# Patient Record
Sex: Female | Born: 1965 | Race: Black or African American | Hispanic: No | Marital: Single | Smoking: Current some day smoker
Health system: Southern US, Community
[De-identification: ages and names within clinical notes are randomized; demographics above are authoritative.]

## PROBLEM LIST (undated history)

## (undated) DIAGNOSIS — J40 Bronchitis, not specified as acute or chronic: Secondary | ICD-10-CM

## (undated) DIAGNOSIS — J449 Chronic obstructive pulmonary disease, unspecified: Secondary | ICD-10-CM

## (undated) DIAGNOSIS — R0600 Dyspnea, unspecified: Secondary | ICD-10-CM

## (undated) DIAGNOSIS — G459 Transient cerebral ischemic attack, unspecified: Secondary | ICD-10-CM

## (undated) DIAGNOSIS — C3490 Malignant neoplasm of unspecified part of unspecified bronchus or lung: Secondary | ICD-10-CM

## (undated) DIAGNOSIS — E119 Type 2 diabetes mellitus without complications: Secondary | ICD-10-CM

## (undated) DIAGNOSIS — I10 Essential (primary) hypertension: Secondary | ICD-10-CM

## (undated) DIAGNOSIS — K219 Gastro-esophageal reflux disease without esophagitis: Secondary | ICD-10-CM

## (undated) DIAGNOSIS — J189 Pneumonia, unspecified organism: Secondary | ICD-10-CM

## (undated) DIAGNOSIS — M1990 Unspecified osteoarthritis, unspecified site: Secondary | ICD-10-CM

## (undated) DIAGNOSIS — T300 Burn of unspecified body region, unspecified degree: Secondary | ICD-10-CM

## (undated) HISTORY — PX: TUBAL LIGATION: SHX77

## (undated) HISTORY — PX: COLONOSCOPY: SHX174

## (undated) HISTORY — PX: OTHER SURGICAL HISTORY: SHX169

---

## 1999-07-19 ENCOUNTER — Emergency Department (HOSPITAL_COMMUNITY): Admission: EM | Admit: 1999-07-19 | Discharge: 1999-07-19 | Payer: Self-pay | Admitting: Emergency Medicine

## 2000-02-17 ENCOUNTER — Emergency Department (HOSPITAL_COMMUNITY): Admission: EM | Admit: 2000-02-17 | Discharge: 2000-02-17 | Payer: Self-pay | Admitting: Emergency Medicine

## 2007-08-09 ENCOUNTER — Emergency Department (HOSPITAL_COMMUNITY): Admission: EM | Admit: 2007-08-09 | Discharge: 2007-08-09 | Payer: Self-pay | Admitting: Emergency Medicine

## 2007-10-21 ENCOUNTER — Emergency Department (HOSPITAL_COMMUNITY): Admission: EM | Admit: 2007-10-21 | Discharge: 2007-10-21 | Payer: Self-pay | Admitting: Emergency Medicine

## 2010-03-14 ENCOUNTER — Emergency Department (HOSPITAL_COMMUNITY): Admission: EM | Admit: 2010-03-14 | Discharge: 2010-03-15 | Payer: Self-pay | Admitting: Emergency Medicine

## 2010-12-28 LAB — ETHANOL: Alcohol, Ethyl (B): 210 mg/dL — ABNORMAL HIGH (ref 0–10)

## 2010-12-28 LAB — COMPREHENSIVE METABOLIC PANEL
Calcium: 8.6 mg/dL (ref 8.4–10.5)
GFR calc non Af Amer: >60 mL/min (ref >60)
Sodium: 137 mEq/L (ref 135–145)
Total Bilirubin: 0.7 mg/dL (ref 0.3–1.2)
Total Protein: 8.7 g/dL — ABNORMAL HIGH (ref 6.0–8.3)

## 2010-12-28 LAB — CBC
Hemoglobin: 13.8 g/dL (ref 12.0–15.0)
MCV: 97.5 fL (ref 78.0–100.0)
RDW: 14.1 % (ref 11.5–15.5)

## 2010-12-28 LAB — DIFFERENTIAL
Basophils Absolute: 0 10*3/uL (ref 0.0–0.1)
Lymphs Abs: 4.5 10*3/uL — ABNORMAL HIGH (ref 0.7–4.0)
Monocytes Absolute: 0.8 10*3/uL (ref 0.1–1.0)
Monocytes Relative: 7 % (ref 3–12)
Neutro Abs: 6.2 10*3/uL (ref 1.7–7.7)

## 2011-03-24 ENCOUNTER — Inpatient Hospital Stay (INDEPENDENT_AMBULATORY_CARE_PROVIDER_SITE_OTHER)
Admission: RE | Admit: 2011-03-24 | Discharge: 2011-03-24 | Disposition: A | Discharge status: Home / Self Care | Payer: Self-pay | Source: Ambulatory Visit | Attending: Family Medicine | Admitting: Family Medicine

## 2011-03-24 ENCOUNTER — Ambulatory Visit (INDEPENDENT_AMBULATORY_CARE_PROVIDER_SITE_OTHER): Payer: Self-pay

## 2011-03-24 DIAGNOSIS — J40 Bronchitis, not specified as acute or chronic: Secondary | ICD-10-CM

## 2011-03-24 DIAGNOSIS — K047 Periapical abscess without sinus: Secondary | ICD-10-CM

## 2011-07-21 LAB — CBC
HCT: 36.7
MCV: 98.3
RDW: 13.6

## 2011-07-21 LAB — URINE CULTURE: Culture: NO GROWTH

## 2011-07-21 LAB — DIFFERENTIAL
Eosinophils Absolute: 0.2
Eosinophils Relative: 3
Lymphocytes Relative: 37
Lymphs Abs: 2.3
Neutro Abs: 3
Neutrophils Relative %: 49

## 2011-07-21 LAB — COMPREHENSIVE METABOLIC PANEL
ALT: 20
Albumin: 3.4 — ABNORMAL LOW
Calcium: 8.8
Creatinine, Ser: 0.73
GFR calc non Af Amer: >60
Glucose, Bld: 80

## 2011-07-21 LAB — URINALYSIS, ROUTINE W REFLEX MICROSCOPIC
Glucose, UA: NEGATIVE
Hgb urine dipstick: NEGATIVE
Nitrite: NEGATIVE
Urobilinogen, UA: 0.2
pH: 6.5

## 2011-07-21 LAB — GC/CHLAMYDIA PROBE AMP, GENITAL: GC Probe Amp, Genital: NEGATIVE

## 2011-07-21 LAB — POCT PREGNANCY, URINE
Operator id: 198171
Preg Test, Ur: NEGATIVE

## 2011-09-03 ENCOUNTER — Encounter: Payer: Self-pay | Admitting: *Deleted

## 2011-09-03 ENCOUNTER — Emergency Department (HOSPITAL_COMMUNITY)
Admission: EM | Admit: 2011-09-03 | Discharge: 2011-09-03 | Disposition: A | Discharge status: Home / Self Care | Payer: Self-pay | Attending: Emergency Medicine | Admitting: Emergency Medicine

## 2011-09-03 DIAGNOSIS — R220 Localized swelling, mass and lump, head: Secondary | ICD-10-CM | POA: Insufficient documentation

## 2011-09-03 DIAGNOSIS — F17200 Nicotine dependence, unspecified, uncomplicated: Secondary | ICD-10-CM | POA: Insufficient documentation

## 2011-09-03 DIAGNOSIS — R221 Localized swelling, mass and lump, neck: Secondary | ICD-10-CM | POA: Insufficient documentation

## 2011-09-03 DIAGNOSIS — K089 Disorder of teeth and supporting structures, unspecified: Secondary | ICD-10-CM | POA: Insufficient documentation

## 2011-09-03 DIAGNOSIS — K047 Periapical abscess without sinus: Secondary | ICD-10-CM | POA: Insufficient documentation

## 2011-09-03 DIAGNOSIS — R110 Nausea: Secondary | ICD-10-CM | POA: Insufficient documentation

## 2011-09-03 DIAGNOSIS — R509 Fever, unspecified: Secondary | ICD-10-CM | POA: Insufficient documentation

## 2011-09-03 MED ORDER — CLINDAMYCIN HCL 150 MG PO CAPS
300.0000 mg | ORAL_CAPSULE | Freq: Once | ORAL | Status: AC
  Administered 2011-09-03: 300 mg via ORAL
  Filled 2011-09-03: qty 2

## 2011-09-03 MED ORDER — HYDROCODONE-ACETAMINOPHEN 5-325 MG PO TABS: 1.0000 | ORAL_TABLET | ORAL | Status: AC | PRN

## 2011-09-03 MED ORDER — CLINDAMYCIN HCL 150 MG PO CAPS: 150.0000 mg | ORAL_CAPSULE | Freq: Four times a day (QID) | ORAL | Status: AC
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
## 2011-09-03 NOTE — ED Notes (Signed)
Pt d/c home. Pt ambulatory and denies any pain. NAD noted at this time.

## 2011-09-03 NOTE — ED Notes (Signed)
MD at bedside. 

## 2011-09-03 NOTE — ED Provider Notes (Signed)
History     CSN: [id number] Arrival date & time: 09/03/2011 12:29 PM   None     Chief Complaint  Patient presents with  . Dental Pain    (Consider location/radiation/quality/duration/timing/severity/associated sxs/prior treatment) Patient is a 45 y.o. female presenting with tooth pain. The history is provided by the patient.  Dental PainThe primary symptoms include mouth pain and fever. The symptoms began 12 to 24 hours ago. The symptoms are worsening. The symptoms are new. The symptoms occur intermittently.  Additional symptoms include: gum swelling, gum tenderness, purulent gums and facial swelling.    History reviewed. No pertinent past medical history.  Past Surgical History  Procedure Date  . Tubal ligation     No family history on file.  History  Substance Use Topics  . Smoking status: Current Everyday Smoker  . Smokeless tobacco: Not on file  . Alcohol Use: Yes    OB History    Grav Para Term Preterm Abortions TAB SAB Ect Mult Living                  Review of Systems  Constitutional: Positive for fever.  HENT: Positive for facial swelling.   Gastrointestinal: Positive for nausea.  All other systems reviewed and are negative.    Allergies  Review of patient's allergies indicates no known allergies.  Home Medications   Current Outpatient Rx  Name Route Sig Dispense Refill  . IBUPROFEN 200 MG PO TABS Oral Take 200 mg by mouth every 6 (six) hours as needed. For pain       BP 136/89  Pulse 82  Temp(Src) 98.3 F (36.8 C) (Oral)  Resp 20  SpO2 95%  Physical Exam  Nursing note and vitals reviewed. Constitutional: She is oriented to person, place, and time. She appears well-developed and well-nourished.  HENT:  Head: Normocephalic and atraumatic.  Mouth/Throat: Dental abscesses present.    Eyes: Pupils are equal, round, and reactive to light.  Neck: Normal range of motion. Neck supple.  Cardiovascular: Normal rate, regular rhythm and normal  heart sounds.   Pulmonary/Chest: Effort normal and breath sounds normal.  Abdominal: Soft. Bowel sounds are normal.  Musculoskeletal: Normal range of motion.  Neurological: She is alert and oriented to person, place, and time.  Skin: Skin is warm.  Psychiatric: She has a normal mood and affect.    ED Course  Procedures (including critical care time)  Labs Reviewed - No data to display No results found.   No diagnosis found.    MDM  Dental abscess        Jimmye Norman, NP 09/03/11 1553  Jimmye Norman, NP 09/03/11 1600

## 2011-09-04 NOTE — ED Notes (Signed)
Patient called requesting a less expensive antibiotic. Chart reviewed by [facility] who prescribed Penicillin 500 mg tab, one tab QID x seven days. New Rx called to [facility] [phone] per pt request. Patient notified by phone of new rx.

## 2011-09-04 NOTE — ED Provider Notes (Signed)
Medical screening examination/treatment/procedure(s) were performed by non-physician practitioner and as supervising physician I was immediately available for consultation/collaboration.  Nicholes Stairs, MD 09/04/11 [phone]

## 2012-03-23 ENCOUNTER — Encounter (HOSPITAL_COMMUNITY): Payer: Self-pay

## 2012-03-23 ENCOUNTER — Emergency Department (HOSPITAL_COMMUNITY)
Admission: EM | Admit: 2012-03-23 | Discharge: 2012-03-24 | Disposition: A | Discharge status: Home / Self Care | Payer: Self-pay | Attending: Emergency Medicine | Admitting: Emergency Medicine

## 2012-03-23 ENCOUNTER — Emergency Department (HOSPITAL_COMMUNITY): Payer: Self-pay

## 2012-03-23 DIAGNOSIS — R209 Unspecified disturbances of skin sensation: Secondary | ICD-10-CM | POA: Insufficient documentation

## 2012-03-23 DIAGNOSIS — R202 Paresthesia of skin: Secondary | ICD-10-CM

## 2012-03-23 DIAGNOSIS — M542 Cervicalgia: Secondary | ICD-10-CM | POA: Insufficient documentation

## 2012-03-23 DIAGNOSIS — M79609 Pain in unspecified limb: Secondary | ICD-10-CM | POA: Insufficient documentation

## 2012-03-23 HISTORY — DX: Burn of unspecified body region, unspecified degree: T30.0

## 2012-03-23 LAB — COMPREHENSIVE METABOLIC PANEL
Alkaline Phosphatase: 78 U/L (ref 39–117)
BUN: 5 mg/dL — ABNORMAL LOW (ref 6–23)
Chloride: 101 mEq/L (ref 96–112)
Creatinine, Ser: 0.64 mg/dL (ref 0.50–1.10)
GFR calc Af Amer: >90 mL/min (ref >90)
GFR calc non Af Amer: >90 mL/min (ref >90)
Glucose, Bld: 163 mg/dL — ABNORMAL HIGH (ref 70–99)
Potassium: 3.2 mEq/L — ABNORMAL LOW (ref 3.5–5.1)
Total Bilirubin: 0.4 mg/dL (ref 0.3–1.2)

## 2012-03-23 LAB — POCT I-STAT TROPONIN I: Troponin i, poc: 0 ng/mL (ref 0.00–0.08)

## 2012-03-23 LAB — CBC
Hemoglobin: 15.3 g/dL — ABNORMAL HIGH (ref 12.0–15.0)
MCH: 34.3 pg — ABNORMAL HIGH (ref 26.0–34.0)
MCHC: 34.2 g/dL (ref 30.0–36.0)
MCV: 100.4 fL — ABNORMAL HIGH (ref 78.0–100.0)
RDW: 12.4 % (ref 11.5–15.5)

## 2012-03-23 MED ORDER — M.V.I. ADULT IV INJ
INJECTION | Freq: Once | INTRAVENOUS | Status: AC
  Administered 2012-03-23: 21:00:00 via INTRAVENOUS
  Filled 2012-03-23: qty 1000

## 2012-03-23 NOTE — ED Notes (Signed)
Pt state sitting watching tv and felt as if her arm was drawing in. Now c/o left shoulder pain. Pt denies chest pain or any increase in sob. Pt c/o exertional sob for 1 year. Pt is alertx3 MAE randomly.
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
## 2012-03-24 MED ORDER — PREDNISONE 20 MG PO TABS
60.0000 mg | ORAL_TABLET | Freq: Once | ORAL | Status: AC
  Administered 2012-03-24: 60 mg via ORAL
  Filled 2012-03-24: qty 3

## 2012-03-24 MED ORDER — PREDNISONE 20 MG PO TABS: 40.0000 mg | ORAL_TABLET | Freq: Every day | ORAL | Status: AC

## 2012-03-24 NOTE — Discharge Instructions (Signed)
Neuropathic Pain  We often think that pain has a physical cause. If we get rid of the cause, the pain should go away. Nerves themselves can also cause pain. It is called neuropathic pain, which means nerve abnormality. It may be difficult for the patients who have it and for the treating caregivers. Pain is usually described as acute (short-lived) or chronic (long-lasting). Acute pain is related to the physical sensations caused by an injury. It can last from a few seconds to many weeks, but it usually goes away when normal healing occurs. Chronic pain lasts beyond the typical healing time. With neuropathic pain, the nerve fibers themselves may be damaged or injured. They then send incorrect signals to other pain centers. The pain you feel is real, but the cause is not easy to find.   CAUSES   Chronic pain can result from diseases, such as diabetes and shingles (an infection related to chickenpox), or from trauma, surgery, or amputation. It can also happen without any known injury or disease. The nerves are sending pain messages, even though there is no identifiable cause for such messages.    Other common causes of neuropathy include diabetes, phantom limb pain, or Regional Pain Syndrome (RPS).   As with all forms of chronic back pain, if neuropathy is not correctly treated, there can be a number of associated problems that lead to a downward cycle for the patient. These include depression, sleeplessness, feelings of fear and anxiety, limited social interaction and inability to do normal daily activities or work.   The most dramatic and mysterious example of neuropathic pain is called "phantom limb syndrome." This occurs when an arm or a leg has been removed because of illness or injury. The brain still gets pain messages from the nerves that originally carried impulses from the missing limb. These nerves now seem to misfire and cause troubling pain.   Neuropathic pain often seems to have no cause. It responds  poorly to standard pain treatment.  Neuropathic pain can occur after:   Shingles (herpes zoster virus infection).   A lasting burning sensation of the skin, caused usually by injury to a peripheral nerve.   Peripheral neuropathy which is widespread nerve damage, often caused by diabetes or alcoholism.   Phantom limb pain following an amputation.   Facial nerve problems (trigeminal neuralgia).   Multiple sclerosis.   Reflex sympathetic dystrophy.   Pain which comes with cancer and cancer chemotherapy.   Entrapment neuropathy such as when pressure is put on a nerve such as in carpal tunnel syndrome.   Back, leg, and hip problems (sciatica).   Spine or back surgery.   HIV Infection or AIDS where nerves are infected by viruses.  Your caregiver can explain items in the above list which may apply to you.  SYMPTOMS   Characteristics of neuropathic pain are:   Severe, sharp, electric shock-like, shooting, lightening-like, knife-like.   Pins and needles sensation.   Deep burning, deep cold, or deep ache.   Persistent numbness, tingling, or weakness.   Pain resulting from light touch or other stimulus that would not usually cause pain.   Increased sensitivity to something that would normally cause pain, such as a pinprick.  Pain may persist for months or years following the healing of damaged tissues. When this happens, pain signals no longer sound an alarm about current injuries or injuries about to happen. Instead, the alarm system itself is not working correctly.   Neuropathic pain may get worse instead of   to serious disability. It is important to be aware that severe injury in a limb can occur without a proper, protective pain response.Burns, cuts, and other injuries may go unnoticed. Without proper treatment, these injuries can become infected or lead to further disability. Take any injury seriously, and consult your caregiver  for treatment. DIAGNOSIS  When you have a pain with no known cause, your caregiver will probably ask some specific questions:   Do you have any other conditions, such as diabetes, shingles, multiple sclerosis, or HIV infection?   How would you describe your pain? (Neuropathic pain is often described as shooting, stabbing, burning, or searing.)   Is your pain worse at any time of the day? (Neuropathic pain is usually worse at night.)   Does the pain seem to follow a certain physical pathway?   Does the pain come from an area that has missing or injured nerves? (An example would be phantom limb pain.)   Is the pain triggered by minor things such as rubbing against the sheets at night?  These questions often help define the type of pain involved. Once your caregiver knows what is happening, treatment can begin. Anticonvulsant, antidepressant drugs, and various pain relievers seem to work in some cases. If another condition, such as diabetes is involved, better management of that disorder may relieve the neuropathic pain.  TREATMENT  Neuropathic pain is frequently long-lasting and tends not to respond to treatment with narcotic type pain medication. It may respond well to other drugs such as antiseizure and antidepressant medications. Usually, neuropathic problems do not completely go away, but partial improvement is often possible with proper treatment. Your caregivers have large numbers of medications available to treat you. Do not be discouraged if you do not get immediate relief. Sometimes different medications or a combination of medications will be tried before you receive the results you are hoping for. See your caregiver if you have pain that seems to be coming from nowhere and does not go away. Help is available.  SEEK IMMEDIATE MEDICAL CARE IF:   There is a sudden change in the quality of your pain, especially if the change is on only one side of the body.   You notice changes of the  skin, such as redness, black or purple discoloration, swelling, or an ulcer.   You cannot move the affected limbs.  Document Released: 06/24/2004 Document Revised: 09/16/2011 Document Reviewed: 06/24/2004 [facility] Patient Information 2012 [place], [place].Paresthesia Paresthesia is an abnormal burning or prickling sensation. This sensation is generally felt in the hands, arms, legs, or feet. However, it may occur in any part of the body. It is usually not painful. The feeling may be described as:  Tingling or numbness.   "Pins and needles."   Skin crawling.   Buzzing.   Limbs "falling asleep."   Itching.  Most people experience temporary (transient) paresthesia at some time in their lives. CAUSES  Paresthesia may occur when you breathe too quickly (hyperventilation). It can also occur without any apparent cause. Commonly, paresthesia occurs when pressure is placed on a nerve. The feeling quickly goes away once the pressure is removed. For some people, however, paresthesia is a long-lasting (chronic) condition caused by an underlying disorder. The underlying disorder may be:  A traumatic, direct injury to nerves. Examples include a:   Broken (fractured) neck.   Fractured skull.   A disorder affecting the brain and spinal cord (central nervous system). Examples include:   Transverse myelitis.   Encephalitis.  Transient ischemic attack.   Multiple sclerosis.   Stroke.   Tumor or blood vessel problems, such as an arteriovenous malformation pressing against the brain or spinal cord.   A condition that damages the peripheral nerves (peripheral neuropathy). Peripheral nerves are not part of the brain and spinal cord. These conditions include:   Diabetes.   Peripheral vascular disease.   Nerve entrapment syndromes, such as carpal tunnel syndrome.   Shingles.   Hypothyroidism.   Vitamin B12 deficiencies.   Alcoholism.   Heavy metal poisoning (lead, arsenic).    Rheumatoid arthritis.   Systemic lupus erythematosus.  DIAGNOSIS  Your caregiver will attempt to find the underlying cause of your paresthesia. Your caregiver may:  Take your medical history.   Perform a physical exam.   Order various lab tests.   Order imaging tests.  TREATMENT  Treatment for paresthesia depends on the underlying cause. HOME CARE INSTRUCTIONS  Avoid drinking alcohol.   You may consider massage or acupuncture to help relieve your symptoms.   Keep all follow-up appointments as directed by your caregiver.  SEEK IMMEDIATE MEDICAL CARE IF:   You feel weak.   You have trouble walking or moving.   You have problems with speech or vision.   You feel confused.   You cannot control your bladder or bowel movements.   You feel numbness after an injury.   You faint.   Your burning or prickling feeling gets worse when walking.   You have pain, cramps, or dizziness.   You develop a rash.  MAKE SURE YOU:  Understand these instructions.   Will watch your condition.   Will get help right away if you are not doing well or get worse.  Document Released: 09/17/2002 Document Revised: 09/16/2011 Document Reviewed: 06/18/2011 [facility] Patient Information 2012 [place], [place].

## 2012-03-24 NOTE — ED Provider Notes (Signed)
Medical screening examination/treatment/procedure(s) were performed by non-physician practitioner and as supervising physician I was immediately available for consultation/collaboration.   Gerhard Munch, MD 03/24/12 [phone]

## 2012-03-24 NOTE — ED Provider Notes (Signed)
Medical screening examination/treatment/procedure(s) were performed by non-physician practitioner and as supervising physician I was immediately available for consultation/collaboration.   Gerhard Munch, MD 03/24/12 [phone]

## 2012-07-24 ENCOUNTER — Emergency Department (HOSPITAL_COMMUNITY)
Admission: EM | Admit: 2012-07-24 | Discharge: 2012-07-24 | Disposition: A | Discharge status: Home / Self Care | Payer: Self-pay | Attending: Emergency Medicine | Admitting: Emergency Medicine

## 2012-07-24 ENCOUNTER — Encounter (HOSPITAL_COMMUNITY): Payer: Self-pay | Admitting: Emergency Medicine

## 2012-07-24 ENCOUNTER — Emergency Department (HOSPITAL_COMMUNITY): Payer: Self-pay

## 2012-07-24 DIAGNOSIS — F17200 Nicotine dependence, unspecified, uncomplicated: Secondary | ICD-10-CM | POA: Insufficient documentation

## 2012-07-24 DIAGNOSIS — J069 Acute upper respiratory infection, unspecified: Secondary | ICD-10-CM | POA: Insufficient documentation

## 2012-07-24 MED ORDER — ALBUTEROL SULFATE HFA 108 (90 BASE) MCG/ACT IN AERS
INHALATION_SPRAY | RESPIRATORY_TRACT | Status: AC
  Administered 2012-07-24: 2
  Filled 2012-07-24: qty 6.7

## 2012-07-24 MED ORDER — BENZONATATE 100 MG PO CAPS: 100.0000 mg | ORAL_CAPSULE | Freq: Three times a day (TID) | ORAL | Status: DC | PRN

## 2012-07-24 MED ORDER — ALBUTEROL SULFATE (5 MG/ML) 0.5% IN NEBU: 5.0000 mg | INHALATION_SOLUTION | Freq: Once | RESPIRATORY_TRACT | Status: DC

## 2012-07-24 MED ORDER — GUAIFENESIN 100 MG/5ML PO LIQD: 100.0000 mg | ORAL | Status: DC | PRN

## 2012-07-24 NOTE — ED Notes (Signed)
Pt c/o URI sx with productive cough and yellow sputum; pt sts sore throat and some pain with cough and SOB

## 2012-07-24 NOTE — ED Notes (Signed)
Pt reports productive cough w/yellow/white colored sputum x1 week
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
## 2012-07-25 NOTE — ED Provider Notes (Signed)
Medical screening examination/treatment/procedure(s) were performed by non-physician practitioner and as supervising physician I was immediately available for consultation/collaboration.  Flint Melter, MD 07/25/12 [phone]

## 2013-01-17 ENCOUNTER — Encounter (HOSPITAL_COMMUNITY): Payer: Self-pay | Admitting: Family Medicine

## 2013-01-17 ENCOUNTER — Emergency Department (HOSPITAL_COMMUNITY)
Admission: EM | Admit: 2013-01-17 | Discharge: 2013-01-17 | Disposition: A | Discharge status: Home / Self Care | Payer: Self-pay | Attending: Emergency Medicine | Admitting: Emergency Medicine

## 2013-01-17 DIAGNOSIS — Z87828 Personal history of other (healed) physical injury and trauma: Secondary | ICD-10-CM | POA: Insufficient documentation

## 2013-01-17 DIAGNOSIS — F17200 Nicotine dependence, unspecified, uncomplicated: Secondary | ICD-10-CM | POA: Insufficient documentation

## 2013-01-17 DIAGNOSIS — F1010 Alcohol abuse, uncomplicated: Secondary | ICD-10-CM | POA: Insufficient documentation

## 2013-01-17 DIAGNOSIS — K219 Gastro-esophageal reflux disease without esophagitis: Secondary | ICD-10-CM | POA: Insufficient documentation

## 2013-01-17 DIAGNOSIS — R109 Unspecified abdominal pain: Secondary | ICD-10-CM | POA: Insufficient documentation

## 2013-01-17 DIAGNOSIS — F10929 Alcohol use, unspecified with intoxication, unspecified: Secondary | ICD-10-CM

## 2013-01-17 DIAGNOSIS — R209 Unspecified disturbances of skin sensation: Secondary | ICD-10-CM | POA: Insufficient documentation

## 2013-01-17 LAB — CBC WITH DIFFERENTIAL/PLATELET
Basophils Absolute: 0 10*3/uL (ref 0.0–0.1)
Basophils Relative: 1 % (ref 0–1)
Eosinophils Absolute: 0.1 10*3/uL (ref 0.0–0.7)
Eosinophils Relative: 1 % (ref 0–5)
Lymphs Abs: 2.8 10*3/uL (ref 0.7–4.0)
MCH: 35.2 pg — ABNORMAL HIGH (ref 26.0–34.0)
MCHC: 35.7 g/dL (ref 30.0–36.0)
MCV: 98.4 fL (ref 78.0–100.0)
Platelets: 283 10*3/uL (ref 150–400)
RDW: 12.3 % (ref 11.5–15.5)

## 2013-01-17 LAB — URINALYSIS, ROUTINE W REFLEX MICROSCOPIC
Hgb urine dipstick: NEGATIVE
Nitrite: NEGATIVE
Specific Gravity, Urine: 1.006 (ref 1.005–1.030)
Urobilinogen, UA: 0.2 mg/dL (ref 0.0–1.0)

## 2013-01-17 LAB — COMPREHENSIVE METABOLIC PANEL
ALT: 74 U/L — ABNORMAL HIGH (ref 0–35)
Albumin: 4.1 g/dL (ref 3.5–5.2)
Calcium: 9.5 mg/dL (ref 8.4–10.5)
GFR calc Af Amer: >90 mL/min (ref >90)
Glucose, Bld: 143 mg/dL — ABNORMAL HIGH (ref 70–99)
Sodium: 137 mEq/L (ref 135–145)
Total Protein: 8.6 g/dL — ABNORMAL HIGH (ref 6.0–8.3)

## 2013-01-17 LAB — ETHANOL: Alcohol, Ethyl (B): 225 mg/dL — ABNORMAL HIGH (ref 0–11)

## 2013-01-17 MED ORDER — GI COCKTAIL ~~LOC~~
30.0000 mL | Freq: Once | ORAL | Status: AC
  Administered 2013-01-17: 30 mL via ORAL
  Filled 2013-01-17: qty 30

## 2013-01-17 MED ORDER — OMEPRAZOLE 20 MG PO CPDR: 40.0000 mg | DELAYED_RELEASE_CAPSULE | Freq: Every day | ORAL | Status: DC

## 2013-01-17 NOTE — ED Notes (Signed)
Dr.Knapp at bedside  

## 2013-01-17 NOTE — ED Notes (Signed)
Charge RN and Dr Lynelle Doctor notified that pt will be taking the bus home with bus pass. Dr Lynelle Doctor verifies pt stbale to take bus home since pt ambulatory in ED prior to d/c. Pt given d/c teaching and prescriptions. Pt verbalizes understanding and has no further questions upon d/c. NAD noted upon d/c. Pt ambulated to front of department and shown where bus stop is. Pt ambulatory upon d/c.

## 2013-01-17 NOTE — ED Notes (Signed)
Pt instructed not to drive. Pt endorses she will not be driving. Pt given bus pass and verified with charge RN and Dr Lynelle Doctor that pt being given bus pass to get home. Pt states she does not and will not drive. Pt states she wants to take the bus because that is how she got here.

## 2013-01-17 NOTE — ED Notes (Signed)
Pt states her last drink was this morning around 0830; pt states she drank a "[employer] 40oz." Pt alert and mentating appropriately. Pt states she is unsure if she has seen blood in stool because states "I haven't really checked." Pt states that her stools are loose.

## 2013-01-17 NOTE — ED Notes (Signed)
Per pt for 2 or 3 months month she has been having sore throat, eye pain, ear pain, sts cramps in her stomach that she cant breathe. sts last drink was 1 hour ago because it makes her feel better. sts also hands numb and eye drainage.
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
## 2013-07-20 ENCOUNTER — Emergency Department (HOSPITAL_COMMUNITY)
Admission: EM | Admit: 2013-07-20 | Discharge: 2013-07-21 | Disposition: A | Discharge status: Other Acute Inpatient Hospital | Payer: Self-pay | Attending: Emergency Medicine | Admitting: Emergency Medicine

## 2013-07-20 ENCOUNTER — Encounter (HOSPITAL_COMMUNITY): Payer: Self-pay | Admitting: Emergency Medicine

## 2013-07-20 ENCOUNTER — Emergency Department (HOSPITAL_COMMUNITY): Payer: Self-pay

## 2013-07-20 DIAGNOSIS — G8929 Other chronic pain: Secondary | ICD-10-CM | POA: Insufficient documentation

## 2013-07-20 DIAGNOSIS — M79609 Pain in unspecified limb: Secondary | ICD-10-CM | POA: Insufficient documentation

## 2013-07-20 DIAGNOSIS — F1010 Alcohol abuse, uncomplicated: Secondary | ICD-10-CM | POA: Insufficient documentation

## 2013-07-20 DIAGNOSIS — J40 Bronchitis, not specified as acute or chronic: Secondary | ICD-10-CM | POA: Insufficient documentation

## 2013-07-20 DIAGNOSIS — Z87828 Personal history of other (healed) physical injury and trauma: Secondary | ICD-10-CM | POA: Insufficient documentation

## 2013-07-20 DIAGNOSIS — F10929 Alcohol use, unspecified with intoxication, unspecified: Secondary | ICD-10-CM

## 2013-07-20 DIAGNOSIS — F1410 Cocaine abuse, uncomplicated: Secondary | ICD-10-CM | POA: Insufficient documentation

## 2013-07-20 DIAGNOSIS — R45851 Suicidal ideations: Secondary | ICD-10-CM | POA: Insufficient documentation

## 2013-07-20 DIAGNOSIS — F411 Generalized anxiety disorder: Secondary | ICD-10-CM | POA: Insufficient documentation

## 2013-07-20 DIAGNOSIS — F17200 Nicotine dependence, unspecified, uncomplicated: Secondary | ICD-10-CM | POA: Insufficient documentation

## 2013-07-20 DIAGNOSIS — Z3202 Encounter for pregnancy test, result negative: Secondary | ICD-10-CM | POA: Insufficient documentation

## 2013-07-20 LAB — CBC
HCT: 43.2 % (ref 36.0–46.0)
Hemoglobin: 15.1 g/dL — ABNORMAL HIGH (ref 12.0–15.0)
MCH: 34.5 pg — ABNORMAL HIGH (ref 26.0–34.0)
MCHC: 35 g/dL (ref 30.0–36.0)
RBC: 4.38 MIL/uL (ref 3.87–5.11)
WBC: 7.8 10*3/uL (ref 4.0–10.5)

## 2013-07-20 LAB — COMPREHENSIVE METABOLIC PANEL
ALT: 52 U/L — ABNORMAL HIGH (ref 0–35)
AST: 70 U/L — ABNORMAL HIGH (ref 0–37)
Alkaline Phosphatase: 78 U/L (ref 39–117)
BUN: 7 mg/dL (ref 6–23)
Chloride: 107 mEq/L (ref 96–112)
GFR calc Af Amer: >90 mL/min (ref >90)
Glucose, Bld: 90 mg/dL (ref 70–99)
Potassium: 3.8 mEq/L (ref 3.5–5.1)
Sodium: 143 mEq/L (ref 135–145)
Total Protein: 8.2 g/dL (ref 6.0–8.3)

## 2013-07-20 LAB — RAPID URINE DRUG SCREEN, HOSP PERFORMED
Amphetamines: NOT DETECTED
Barbiturates: NOT DETECTED
Benzodiazepines: NOT DETECTED
Cocaine: POSITIVE — AB
Opiates: NOT DETECTED
Tetrahydrocannabinol: NOT DETECTED

## 2013-07-20 LAB — ETHANOL: Alcohol, Ethyl (B): 216 mg/dL — ABNORMAL HIGH (ref 0–11)

## 2013-07-20 LAB — URINALYSIS, ROUTINE W REFLEX MICROSCOPIC
Glucose, UA: NEGATIVE mg/dL
Leukocytes, UA: NEGATIVE
Nitrite: NEGATIVE
pH: 5 (ref 5.0–8.0)

## 2013-07-20 LAB — ACETAMINOPHEN LEVEL: Acetaminophen (Tylenol), Serum: <15 ug/mL (ref 10–30)

## 2013-07-20 MED ORDER — VITAMIN B-1 100 MG PO TABS
100.0000 mg | ORAL_TABLET | Freq: Every day | ORAL | Status: DC
  Administered 2013-07-20: 21:00:00 100 mg via ORAL
  Filled 2013-07-20: qty 1

## 2013-07-20 MED ORDER — CHLORDIAZEPOXIDE HCL 25 MG PO CAPS: 25.0000 mg | ORAL_CAPSULE | Freq: Three times a day (TID) | ORAL | Status: DC

## 2013-07-20 MED ORDER — CHLORDIAZEPOXIDE HCL 25 MG PO CAPS: 25.0000 mg | ORAL_CAPSULE | Freq: Four times a day (QID) | ORAL | Status: DC | PRN

## 2013-07-20 MED ORDER — ALUM & MAG HYDROXIDE-SIMETH 200-200-20 MG/5ML PO SUSP: 30.0000 mL | ORAL | Status: DC | PRN

## 2013-07-20 MED ORDER — IBUPROFEN 200 MG PO TABS: 600.0000 mg | ORAL_TABLET | Freq: Three times a day (TID) | ORAL | Status: DC | PRN

## 2013-07-20 MED ORDER — ONDANSETRON 4 MG PO TBDP: 4.0000 mg | ORAL_TABLET | Freq: Four times a day (QID) | ORAL | Status: DC | PRN

## 2013-07-20 MED ORDER — LORAZEPAM 1 MG PO TABS: 0.0000 mg | ORAL_TABLET | Freq: Two times a day (BID) | ORAL | Status: DC

## 2013-07-20 MED ORDER — CHLORDIAZEPOXIDE HCL 25 MG PO CAPS: 25.0000 mg | ORAL_CAPSULE | Freq: Four times a day (QID) | ORAL | Status: DC

## 2013-07-20 MED ORDER — IBUPROFEN 400 MG PO TABS: 600.0000 mg | ORAL_TABLET | Freq: Three times a day (TID) | ORAL | Status: DC | PRN

## 2013-07-20 MED ORDER — HYDROCODONE-ACETAMINOPHEN 5-325 MG PO TABS: 2.0000 | ORAL_TABLET | Freq: Four times a day (QID) | ORAL | Status: DC | PRN

## 2013-07-20 MED ORDER — FOLIC ACID 1 MG PO TABS: 1.0000 mg | ORAL_TABLET | Freq: Every day | ORAL | Status: DC

## 2013-07-20 MED ORDER — ACETAMINOPHEN 325 MG PO TABS: 650.0000 mg | ORAL_TABLET | Freq: Four times a day (QID) | ORAL | Status: DC | PRN

## 2013-07-20 MED ORDER — LORAZEPAM 2 MG/ML IJ SOLN: 1.0000 mg | Freq: Four times a day (QID) | INTRAMUSCULAR | Status: DC | PRN

## 2013-07-20 MED ORDER — LORAZEPAM 1 MG PO TABS: 0.0000 mg | ORAL_TABLET | Freq: Four times a day (QID) | ORAL | Status: DC

## 2013-07-20 MED ORDER — HYDROXYZINE HCL 25 MG PO TABS: 25.0000 mg | ORAL_TABLET | Freq: Four times a day (QID) | ORAL | Status: DC | PRN

## 2013-07-20 MED ORDER — OXYCODONE-ACETAMINOPHEN 5-325 MG PO TABS
2.0000 | ORAL_TABLET | Freq: Once | ORAL | Status: AC
  Administered 2013-07-20: 2 via ORAL
  Filled 2013-07-20: qty 2

## 2013-07-20 MED ORDER — ADULT MULTIVITAMIN W/MINERALS CH
1.0000 | ORAL_TABLET | Freq: Every day | ORAL | Status: DC
  Administered 2013-07-20: 1 via ORAL
  Filled 2013-07-20: qty 1

## 2013-07-20 MED ORDER — ADULT MULTIVITAMIN W/MINERALS CH: 1.0000 | ORAL_TABLET | Freq: Every day | ORAL | Status: DC

## 2013-07-20 MED ORDER — MAGNESIUM HYDROXIDE 400 MG/5ML PO SUSP: 30.0000 mL | Freq: Every day | ORAL | Status: DC | PRN

## 2013-07-20 MED ORDER — LORAZEPAM 1 MG PO TABS: 1.0000 mg | ORAL_TABLET | Freq: Four times a day (QID) | ORAL | Status: DC | PRN

## 2013-07-20 MED ORDER — CHLORDIAZEPOXIDE HCL 25 MG PO CAPS: 25.0000 mg | ORAL_CAPSULE | Freq: Every day | ORAL | Status: DC

## 2013-07-20 MED ORDER — LOPERAMIDE HCL 2 MG PO CAPS: 2.0000 mg | ORAL_CAPSULE | ORAL | Status: DC | PRN

## 2013-07-20 MED ORDER — CHLORDIAZEPOXIDE HCL 25 MG PO CAPS: 25.0000 mg | ORAL_CAPSULE | ORAL | Status: DC

## 2013-07-20 MED ORDER — THIAMINE HCL 100 MG/ML IJ SOLN: 100.0000 mg | Freq: Every day | INTRAMUSCULAR | Status: DC

## 2013-07-20 MED ORDER — FOLIC ACID 1 MG PO TABS
1.0000 mg | ORAL_TABLET | Freq: Every day | ORAL | Status: DC
  Administered 2013-07-20: 1 mg via ORAL
  Filled 2013-07-20: qty 1

## 2013-07-20 MED ORDER — CHLORDIAZEPOXIDE HCL 25 MG PO CAPS
25.0000 mg | ORAL_CAPSULE | Freq: Once | ORAL | Status: AC
  Administered 2013-07-21: 25 mg via ORAL
  Filled 2013-07-20: qty 1

## 2013-07-20 MED ORDER — VITAMIN B-1 100 MG PO TABS: 100.0000 mg | ORAL_TABLET | Freq: Every day | ORAL | Status: DC
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
## 2013-07-20 NOTE — ED Notes (Signed)
Telepsych being completed at this time. 

## 2013-07-20 NOTE — Progress Notes (Signed)
Underwriter initiated bed placement for pt.  [facility] has no bed until 07/21/13.  [facility] has no female beds tonight.  Blain Pais, MHT/NS

## 2013-07-20 NOTE — ED Notes (Signed)
Pt stated that she cannot void at this time.

## 2013-07-20 NOTE — ED Notes (Signed)
Pt transported to xray 
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
## 2013-07-20 NOTE — ED Notes (Signed)
Pt states she is here for detox from alcohol and cocaine, last used today, states she has been having bad thoughts of harming herself because she is "tired of living this way." Denies HI. C/o body aches now. States she thinks she may also have bronchitis from smoking too many cigarettes. A&Ox4, resp e/u

## 2013-07-21 ENCOUNTER — Inpatient Hospital Stay (HOSPITAL_COMMUNITY)
Admission: AD | Admit: 2013-07-21 | Discharge: 2013-07-24 | DRG: 897 | Disposition: A | Discharge status: Home / Self Care | Payer: No Typology Code available for payment source | Source: Intra-hospital | Attending: Psychiatry | Admitting: Psychiatry

## 2013-07-21 ENCOUNTER — Encounter (HOSPITAL_COMMUNITY): Payer: Self-pay | Admitting: *Deleted

## 2013-07-21 DIAGNOSIS — F1414 Cocaine abuse with cocaine-induced mood disorder: Secondary | ICD-10-CM | POA: Diagnosis present

## 2013-07-21 DIAGNOSIS — F1020 Alcohol dependence, uncomplicated: Secondary | ICD-10-CM

## 2013-07-21 DIAGNOSIS — F10939 Alcohol use, unspecified with withdrawal, unspecified: Principal | ICD-10-CM | POA: Diagnosis present

## 2013-07-21 DIAGNOSIS — J4489 Other specified chronic obstructive pulmonary disease: Secondary | ICD-10-CM | POA: Diagnosis present

## 2013-07-21 DIAGNOSIS — Z5900 Homelessness unspecified: Secondary | ICD-10-CM

## 2013-07-21 DIAGNOSIS — F1994 Other psychoactive substance use, unspecified with psychoactive substance-induced mood disorder: Secondary | ICD-10-CM | POA: Diagnosis present

## 2013-07-21 DIAGNOSIS — F10239 Alcohol dependence with withdrawal, unspecified: Principal | ICD-10-CM | POA: Diagnosis present

## 2013-07-21 DIAGNOSIS — J449 Chronic obstructive pulmonary disease, unspecified: Secondary | ICD-10-CM | POA: Diagnosis present

## 2013-07-21 DIAGNOSIS — F1410 Cocaine abuse, uncomplicated: Secondary | ICD-10-CM | POA: Diagnosis present

## 2013-07-21 DIAGNOSIS — Z79899 Other long term (current) drug therapy: Secondary | ICD-10-CM

## 2013-07-21 DIAGNOSIS — K219 Gastro-esophageal reflux disease without esophagitis: Secondary | ICD-10-CM | POA: Diagnosis present

## 2013-07-21 HISTORY — DX: Gastro-esophageal reflux disease without esophagitis: K21.9

## 2013-07-21 HISTORY — DX: Pneumonia, unspecified organism: J18.9

## 2013-07-21 HISTORY — DX: Chronic obstructive pulmonary disease, unspecified: J44.9

## 2013-07-21 MED ORDER — ACETAMINOPHEN 325 MG PO TABS
650.0000 mg | ORAL_TABLET | Freq: Four times a day (QID) | ORAL | Status: DC | PRN
  Administered 2013-07-21: 650 mg via ORAL
  Filled 2013-07-21: qty 2

## 2013-07-21 MED ORDER — VITAMIN B-1 100 MG PO TABS
100.0000 mg | ORAL_TABLET | Freq: Every day | ORAL | Status: DC
  Administered 2013-07-22 – 2013-07-24 (×3): 100 mg via ORAL
  Filled 2013-07-21 (×4): qty 1

## 2013-07-21 MED ORDER — PNEUMOCOCCAL VAC POLYVALENT 25 MCG/0.5ML IJ INJ
0.5000 mL | INJECTION | INTRAMUSCULAR | Status: AC
  Administered 2013-07-22: 0.5 mL via INTRAMUSCULAR

## 2013-07-21 MED ORDER — CHLORDIAZEPOXIDE HCL 25 MG PO CAPS
25.0000 mg | ORAL_CAPSULE | Freq: Four times a day (QID) | ORAL | Status: AC
  Administered 2013-07-21 – 2013-07-22 (×6): 25 mg via ORAL
  Filled 2013-07-21 (×6): qty 1

## 2013-07-21 MED ORDER — ADULT MULTIVITAMIN W/MINERALS CH
1.0000 | ORAL_TABLET | Freq: Every day | ORAL | Status: DC
  Administered 2013-07-21 – 2013-07-24 (×4): 1 via ORAL
  Filled 2013-07-21 (×5): qty 1

## 2013-07-21 MED ORDER — CHLORDIAZEPOXIDE HCL 25 MG PO CAPS: 25.0000 mg | ORAL_CAPSULE | Freq: Every day | ORAL | Status: DC

## 2013-07-21 MED ORDER — ALUM & MAG HYDROXIDE-SIMETH 200-200-20 MG/5ML PO SUSP
30.0000 mL | ORAL | Status: DC | PRN
  Administered 2013-07-22: 30 mL via ORAL

## 2013-07-21 MED ORDER — CLOTRIMAZOLE 2 % VA CREA
1.0000 | TOPICAL_CREAM | Freq: Every day | VAGINAL | Status: DC
  Filled 2013-07-21: qty 22.2

## 2013-07-21 MED ORDER — ONDANSETRON 4 MG PO TBDP
4.0000 mg | ORAL_TABLET | Freq: Four times a day (QID) | ORAL | Status: AC | PRN
  Administered 2013-07-21: 4 mg via ORAL
  Filled 2013-07-21: qty 1

## 2013-07-21 MED ORDER — CHLORDIAZEPOXIDE HCL 25 MG PO CAPS
25.0000 mg | ORAL_CAPSULE | Freq: Four times a day (QID) | ORAL | Status: AC | PRN
  Administered 2013-07-22: 25 mg via ORAL
  Filled 2013-07-21: qty 1

## 2013-07-21 MED ORDER — MICONAZOLE NITRATE 2 % VA CREA
1.0000 | TOPICAL_CREAM | Freq: Every day | VAGINAL | Status: DC
  Administered 2013-07-21 – 2013-07-23 (×3): 1 via VAGINAL
  Filled 2013-07-21: qty 45

## 2013-07-21 MED ORDER — CHLORDIAZEPOXIDE HCL 25 MG PO CAPS
25.0000 mg | ORAL_CAPSULE | Freq: Once | ORAL | Status: AC
  Administered 2013-07-21: 25 mg via ORAL
  Filled 2013-07-21: qty 1

## 2013-07-21 MED ORDER — HYDROXYZINE HCL 25 MG PO TABS: 25.0000 mg | ORAL_TABLET | Freq: Four times a day (QID) | ORAL | Status: AC | PRN

## 2013-07-21 MED ORDER — MELOXICAM 7.5 MG PO TABS
7.5000 mg | ORAL_TABLET | Freq: Every day | ORAL | Status: DC
  Administered 2013-07-21 – 2013-07-24 (×4): 7.5 mg via ORAL
  Filled 2013-07-21 (×5): qty 1

## 2013-07-21 MED ORDER — CHLORDIAZEPOXIDE HCL 25 MG PO CAPS
25.0000 mg | ORAL_CAPSULE | Freq: Three times a day (TID) | ORAL | Status: AC
  Administered 2013-07-22 – 2013-07-23 (×3): 25 mg via ORAL
  Filled 2013-07-21 (×3): qty 1

## 2013-07-21 MED ORDER — NICOTINE 21 MG/24HR TD PT24
MEDICATED_PATCH | TRANSDERMAL | Status: AC
  Administered 2013-07-21: 09:00:00
  Filled 2013-07-21: qty 1

## 2013-07-21 MED ORDER — FLUOXETINE HCL 10 MG PO CAPS
10.0000 mg | ORAL_CAPSULE | Freq: Every day | ORAL | Status: DC
  Administered 2013-07-21 – 2013-07-24 (×4): 10 mg via ORAL
  Filled 2013-07-21 (×5): qty 1

## 2013-07-21 MED ORDER — MAGNESIUM HYDROXIDE 400 MG/5ML PO SUSP
30.0000 mL | Freq: Every day | ORAL | Status: DC | PRN
  Administered 2013-07-22: 30 mL via ORAL

## 2013-07-21 MED ORDER — THIAMINE HCL 100 MG/ML IJ SOLN: 100.0000 mg | Freq: Once | INTRAMUSCULAR | Status: DC

## 2013-07-21 MED ORDER — CHLORDIAZEPOXIDE HCL 25 MG PO CAPS
25.0000 mg | ORAL_CAPSULE | ORAL | Status: AC
  Administered 2013-07-23 – 2013-07-24 (×2): 25 mg via ORAL
  Filled 2013-07-21 (×2): qty 1

## 2013-07-21 MED ORDER — IBUPROFEN 200 MG PO TABS
400.0000 mg | ORAL_TABLET | Freq: Four times a day (QID) | ORAL | Status: DC | PRN
  Administered 2013-07-21 – 2013-07-23 (×4): 400 mg via ORAL
  Filled 2013-07-21 (×5): qty 2

## 2013-07-21 MED ORDER — LOPERAMIDE HCL 2 MG PO CAPS: 2.0000 mg | ORAL_CAPSULE | ORAL | Status: AC | PRN

## 2013-07-21 NOTE — H&P (Signed)
  Pt was seen by me today and I agree with the key elements documented in H&P. Also see my SI assessment note for additional plan recommendations. 

## 2013-07-21 NOTE — ED Notes (Signed)
BH called in regards to femoral AVN and pain management. Dr. Norlene Campbell made aware- states Vicodin is appropriate for pt to take for pain. BH and pt. Updated on delay.

## 2013-07-21 NOTE — [facility] Group Notes (Signed)
[facility] Group Notes:  (Clinical Social Work)  07/21/2013   3:00-4:00PM  Summary of Progress/Problems:   The main focus of today's process group was for the patient to identify ways in which they have sabotaged their own mental health wellness/recovery.  Motivational interviewing was used to explore the reasons they engage in this behavior, and reasons they may have for wanting to change.  The Stages of Change were explained to the group using a handout, and patients identified where they are with regard to changing self-defeating behaviors.  The patient expressed that she self sabotages by telling herself lies, saying to herself that she can handle things on her own, does not need help.  She does this to protect herself from pain.  She is in Preparation Stage of change.  Type of Therapy:  Process Group  Participation Level:  Active  Participation Quality:  Attentive  Affect:  Blunted  Cognitive:  Oriented  Insight:  Engaged  Engagement in Therapy:  Engaged  Modes of Intervention:  Education, Motivational Interviewing   Ambrose Mantle, LCSW 07/21/2013, 4:28 PM

## 2013-07-21 NOTE — [facility] Suicide Risk Assessment (Addendum)
Suicide Risk Assessment  Admission Assessment     Nursing information obtained from:  Patient Demographic factors:   female Current Mental Status:   sad mood, logical, has suidial thoughts, no hi, no avh Loss Factors:   not able to functions well Historical Factors:   homeless, poor social support Risk Reduction Factors:   wants to get better  CLINICAL FACTORS:   Alcohol/Substance Abuse/Dependencies  COGNITIVE FEATURES THAT CONTRIBUTE TO RISK:  Closed-mindedness    SUICIDE RISK:   Moderate:  Frequent suicidal ideation with limited intensity, and duration, some specificity in terms of plans, no associated intent, good self-control, limited dysphoria/symptomatology, some risk factors present, and identifiable protective factors, including available and accessible social support.  PLAN OF CARE:  Continue current meds  I certify that inpatient services furnished can reasonably be expected to improve the patient's condition.   Wonda Cerise 07/21/2013, 1:57 PM

## 2013-07-21 NOTE — Progress Notes (Signed)
Requested and received prn medication for left leg pain.
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
## 2013-07-21 NOTE — Progress Notes (Deleted)
Patient currently sitting in bed with her light on writing in her journal. Writer encouraged patient to at least try and rest and she reports she is going to write one more thing and then try. Writer informed patient that she only has visteril for sleep left to take. Patient voiced no complaints, safety maintained and 1:1 continues. Will continue to monitor.

## 2013-07-21 NOTE — ED Provider Notes (Signed)
Pt has history of chronic right hip pain, xrays show AVN and moderate to severe degenerative changes.  Treatment for this can range from NSAIDS such as naproxen (500 mg bid) or mobic (7.5 to 15 mg qday) to opiates such as percocet or vicodin.  If there is concern about use of controlled medication while in a treatment program, I advise using NSAIDS for pain.  Olivia Mackie, MD 07/21/13 [phone]

## 2013-07-21 NOTE — Progress Notes (Signed)
Psychoeducational Group Note  Date:  05/20/2012 Time: 1015  Group Topic/Focus:  Identifying Needs:   The focus of this group is to help patients identify their personal needs that have been historically problematic and identify healthy behaviors to address their needs.  Participation Level: Did not attend   Participation Quality: None  Affect:   Cognitive:  Insight :   Engagement in Group:  Additional Comments:   PDuke RN [employer]

## 2013-07-21 NOTE — Progress Notes (Signed)
Patient ID: Royann Shivers, female   DOB: [birth year], 47 y.o.   MRN: [id number] This is the first [facility] admission for this 47 yo AAF who presented today at the [facility] for detox.  BAL was 216 on arrival, admitted to also using cocaine 2-3 times/month.  States she had been drinking approx. 10 40 oz beers/day, liver enzymes are elevated, AST=70, ALT=52.  Although she denied w/d sx initially, she was soon nauseated and began vomiting, was given zofran, wanted to go to her room before admission was finished, which she did, to lie down.  Was given librium 25 mg loading dose.   Currently resting quietly, will complete rest of admission in am. A)  Denies feeling suicidal or homicidal, able to contract for safety.  Will continue to monitor for safety, R)  Remains safe on unit at this time.

## 2013-07-21 NOTE — Progress Notes (Signed)
D: Pt denies SI/HI/AVH. Pt rates depression 5/10. Pt reports feeling better today. Pt compliant with taking meds and attending groups. Pt stated that she is able to think clearly today because she doesn't have any alcohol in her system. Pt denies any withdrawal s/s. Pt stated that the detox meds are working. A: Medication administered as ordered per MD. Pt educated on new meds, Prozac and Mobic. Pt encouraged to report any unusual symptoms. Verbal support given. Pt encouraged to attend groups. 15 minute checks performed for safety. R: Pt stated that she need pain meds at bedtime. Writer informed pt to request for prn motrin if needed. Pt is pleasant and cooperative. Safety maintained.

## 2013-07-22 MED ORDER — NICOTINE 21 MG/24HR TD PT24
MEDICATED_PATCH | TRANSDERMAL | Status: AC
  Administered 2013-07-22: 21 mg
  Filled 2013-07-22: qty 1

## 2013-07-22 NOTE — Progress Notes (Signed)
Patient ID: Peggy Warren, female   DOB: 05-Oct-1966, 47 y.o.   MRN: [id number] D)  Pt states is feeling better this evening, hasn't had the nausea and vomiting that she had last night after she arrived, feels the meds she has been taking for detox have been helpful today, currently denies w/d sx.  Came out to consult room to answer some of the questions she hadn't felt like answering when she was sick last night, and vomiting.  Stated was tired, went to bed shortly after this. A)  Will continue to monitor for safety, continue POC R)  Safety maintained.

## 2013-07-22 NOTE — Progress Notes (Signed)
Psychoeducational Group Note  Date: 07/22/2013 Time: 0930 Group Topic/Focus:  Gratefulness:  The focus of this group is to help patients identify what three things they are most grateful for in their lives. What helps ground them and to center them on their work to their recovery.  Participation Level: Did not attend Dione Housekeeper
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
## 2013-07-22 NOTE — Progress Notes (Signed)
Writer spoke with patient 1:1 and she reports having had a better day than when she was first admitted. Patient voiced no withdrawal symptoms. Patient reports that her goal is to start attending AA meetings and would like to look into going to a long term tx facility. Patient reports that all her friends drink and she wants to stop b/c she has a daughter in college. Support and encouragement offered, safety maintained on unit. Patient informed of medications scheduled. Patient denies si/hi/a/v hallucinations.

## 2013-07-22 NOTE — Progress Notes (Signed)
[facility] Group Notes:  (Nursing/MHT/Case Management/Adjunct)  Date:  07/22/2013  Time:  2000  Type of Therapy:  Psychoeducational Skills  Participation Level:  Active  Participation Quality:  Appropriate  Affect:  Appropriate  Cognitive:  Appropriate  Insight:  Good  Engagement in Group:  Improving  Modes of Intervention:  Education  Summary of Progress/Problems: The patient shared with the group that she had a good day. She verbalized in group that she will living somewhere else following discharge and that she is looking forward to the opportunity. She did complain of having nausea off and on. The patient did not state a goal for tomorrow. Her support system consists of the treatment center that will provide her with services.   Hazle Coca S 07/22/2013, 11:36 PM

## 2013-07-22 NOTE — Progress Notes (Signed)
D) Pt has attended the groups and interacts with her peers appropriately. Denies SI and HI. Rates her depression is an 8 and her hopelessness is a 3. Affect is blunted, as well as her mood.  A) Given support, reassurance and praise. Encouraged to go to her groups and to stay in the dayroom to socialize. R) Denies SI and HI.

## 2013-07-22 NOTE — [facility] Group Notes (Signed)
[facility] Group Notes:  (Clinical Social Work)  07/22/2013   3:00-4:00PM  Summary of Progress/Problems:   The main focus of today's process group was to   identify the patient's current support system and decide on other supports that can be put in place.  The picture on workbook was used to discuss why additional supports are needed, and a hand-out was distributed with four definitions/levels of support, then used to talk about how patients have given and received all different kinds of support.  An emphasis was placed on using counselor, doctor, therapy groups, 12-step groups, and problem-specific support groups to expand supports.  The patient expressed full comprehension of the concepts presented, and agreed that there is a need to add more supports.  She is interested in going to AA and getting a sponsor.  Type of Therapy:  Process Group  Participation Level:  Active  Participation Quality:  Attentive and Sharing  Affect:  Depressed and Flat  Cognitive:  Oriented  Insight:  Engaged  Engagement in Therapy:  Engaged  Modes of Intervention:  Education,  Support and [employer], LCSW 07/22/2013, 4:25 PM

## 2013-07-22 NOTE — Progress Notes (Signed)
Date: 07/22/2013  Time: 1015  Group Topic/Focus:  Making Healthy Choices: The focus of this group is to help patients identify negative/unhealthy choices they were using prior to admission and identify positive/healthier coping strategies to replace them upon discharge.  Participation Level: Did not attend  Dione Housekeeper  07/22/2013
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
## 2013-07-22 NOTE — Progress Notes (Signed)
Patient reports having had a good day and is looking forward to going elsewhere after discharge. She reports that she is glad that she will not have to return to her previous living arrangements b/c of the drinking environment. Patient c/o constipation and received MOM. She denies si/hi/a/v hallucinations. Support and encouragement offered, safety maintained on unit, will continue to monitor.

## 2013-07-22 NOTE — Progress Notes (Signed)
[facility] Group Notes:  (Nursing/MHT/Case Management/Adjunct)  Date:  07/21/2013 Time:  2000  Type of Therapy:  Psychoeducational Skills  Participation Level:  Active  Participation Quality:  Appropriate  Affect:  Appropriate  Cognitive:  Appropriate  Insight:  Improving  Engagement in Group:  Improving  Modes of Intervention:  Education  Summary of Progress/Problems: The patient verbalized that she had a good day overall. She states that she learned a lot about herself in group. Her coping skills will involve going to AA and or NA meetings. Her goal for tomorrow is to study about her disease.   Hazle Coca S 07/22/2013, 12:39 AM

## 2013-07-23 DIAGNOSIS — F10939 Alcohol use, unspecified with withdrawal, unspecified: Principal | ICD-10-CM | POA: Diagnosis present

## 2013-07-23 DIAGNOSIS — F1414 Cocaine abuse with cocaine-induced mood disorder: Secondary | ICD-10-CM | POA: Diagnosis present

## 2013-07-23 DIAGNOSIS — F1410 Cocaine abuse, uncomplicated: Secondary | ICD-10-CM | POA: Diagnosis present

## 2013-07-23 DIAGNOSIS — F10239 Alcohol dependence with withdrawal, unspecified: Principal | ICD-10-CM | POA: Diagnosis present

## 2013-07-23 DIAGNOSIS — F1994 Other psychoactive substance use, unspecified with psychoactive substance-induced mood disorder: Secondary | ICD-10-CM | POA: Diagnosis present

## 2013-07-23 DIAGNOSIS — F1020 Alcohol dependence, uncomplicated: Secondary | ICD-10-CM | POA: Diagnosis present

## 2013-07-23 MED ORDER — NICOTINE 21 MG/24HR TD PT24
21.0000 mg | MEDICATED_PATCH | Freq: Every day | TRANSDERMAL | Status: DC
  Administered 2013-07-23 – 2013-07-24 (×2): 21 mg via TRANSDERMAL
  Filled 2013-07-23 (×2): qty 1

## 2013-07-23 NOTE — [facility] Group Notes (Signed)
[facility] LCSW Aftercare Discharge Planning Group Note   07/23/2013 8:45 AM  Participation Quality:  Alert and Appropriate   Mood/Affect:  Appropriate and Calm  Depression Rating:  0  Anxiety Rating:  0  Thoughts of Suicide:  Pt denies SI/HI  Will you contract for safety?   Yes  Current AVH:  Pt denies  Plan for Discharge/Comments:  Pt attended discharge planning group and actively participated in group.  CSW provided pt with today's workbook.  Pt states that she wants to follow up with long term treatment.  Pt states that she lives in [place].  CSW will assess for appropriate referrals.  No further needs voiced by pt at this time.    Transportation Means: Pt reports access to transportation  Supports: No supports mentioned at this time  Reyes Ivan, LCSWA 07/23/2013 10:10 AM

## 2013-07-23 NOTE — Progress Notes (Signed)
Patient ID: Peggy Warren, female   DOB: 1966-01-09, 47 y.o.   MRN: [id number] D)  Has been resting quietly tonight, eyes closed, resp reg, non-labored, No c/o's voiced. A)  Will continue to monitor for safety, continue POC R)  Safety maintained

## 2013-07-23 NOTE — [facility] Suicide Risk Assessment (Signed)
[facility] INPATIENT: Family/Significant Other Suicide Prevention Education  Suicide Prevention Education:  Education Completed; No one has been identified by the patient as the family member/significant other with whom the patient will be residing, and identified as the person(s) who will aid the patient in the event of a mental health crisis (suicidal ideations/suicide attempt). With written consent from the patient, the family member/significant other has been provided the following suicide prevention education, prior to the and/or following the discharge of the patient.  The suicide prevention education provided includes the following:  Suicide risk factors  Suicide prevention and interventions  National Suicide Hotline telephone number  [facility] assessment telephone number  [facility] Emergency Assistance 911  [facility] and/or Residential Mobile Crisis Unit telephone number Request made of family/significant other to:  Remove weapons (e.g., guns, rifles, knives), all items previously/currently identified as safety concern.  Remove drugs/medications (over-the-counter, prescriptions, illicit drugs), all items previously/currently identified as a safety concern. The family member/significant other verbalizes understanding of the suicide prevention education information provided. The family member/significant other agrees to remove the items of safety concern listed above. Pt did not c/o SI at admission, nor have they endorsed SI during their stay here. SPE not required.  Reyes Ivan, [place] 07/23/2013  2:43 PM

## 2013-07-23 NOTE — [facility] Group Notes (Signed)
[facility] LCSW Group Therapy  07/23/2013  1:15 PM   Type of Therapy:  Group Therapy  Participation Level:  Active  Participation Quality:  Appropriate and Attentive  Affect:  Appropriate, Flat and Depressed  Cognitive:  Alert and Appropriate  Insight:  Developing/Improving and Engaged  Engagement in Therapy:  Developing/Improving and Engaged  Modes of Intervention:  Clarification, Confrontation, Discussion, Education, Exploration, Limit-setting, Orientation, Problem-solving, Rapport Building, Dance movement psychotherapist, Socialization and Support  Summary of Progress/Problems: Pt identified obstacles faced currently and processed barriers involved in overcoming these obstacles. Pt identified steps necessary for overcoming these obstacles and explored motivation (internal and external) for facing these difficulties head on. Pt further identified one area of concern in their lives and chose a goal to focus on for today. Pt shared that her biggest obstacle is alcohol and being afraid to relapse if she discharges before going to further treatment.  Pt reports never trying to not drink before and this being the first time.  Pt states that she is done with drinking because she is now becoming physically sick from it.  Pt actively participated and was engaged in group discussion.    Reyes Ivan, LCSWA 07/23/2013 3:09 PM

## 2013-07-23 NOTE — Tx Team (Signed)
Interdisciplinary Treatment Plan Update (Adult)  Date: 07/23/2013  Time Reviewed:  9:45 AM  Progress in Treatment: Attending groups: Yes Participating in groups:  Yes Taking medication as prescribed:  Yes Tolerating medication:  Yes Family/Significant othe contact made: CSW assessing  Patient understands diagnosis:  Yes Discussing patient identified problems/goals with staff:  Yes Medical problems stabilized or resolved:  Yes Denies suicidal/homicidal ideation: Yes Issues/concerns per patient self-inventory:  Yes Other:  New problem(s) identified: N/A  Discharge Plan or Barriers: CSW assessing for appropriate referrals.  Reason for Continuation of Hospitalization: Anxiety Depression Medication Stabilization  Comments: N/A  Estimated length of stay: 3-5 days  For review of initial/current patient goals, please see plan of care.  Attendees: Patient:     Family:     Physician:  Dr. Javier Glazier 07/23/2013 12:17 PM   Nursing:   Quintella Reichert, RN 07/23/2013 12:17 PM   Clinical Social Worker:  Reyes Ivan, LCSWA 07/23/2013 12:17 PM   Other: Verne Spurr, PA 07/23/2013 12:17 PM   Other:  Frankey Shown, MA care coordination 07/23/2013 12:17 PM   Other:  Neill Loft, RN 07/23/2013 12:17 PM   Other:     Other:    Other:    Other:    Other:    Other:    Other:     Scribe for Treatment Team:   Carmina Miller, 07/23/2013 12:17 PM

## 2013-07-23 NOTE — Progress Notes (Signed)
[facility] MD Progress Note  07/23/2013 4:00 PM Peggy Warren  MRN:  [id number] Subjective:  Peggy Warren came to the hospital from [facility] on Friday for alcohol detox treatment. She has been receiving librium protocol and has been tolerating withdrawal symptoms. She has chronic right leg pain and x-ray in ER show no abnormal findings. She has plans to help her aunt who needs to see her heart specialist. She is living with her aunt (67) in [place] [address]. Patient has good mood and constricted affect. She has good appetite and sleep.   Diagnosis:   DSM5: Schizophrenia Disorders:   Obsessive-Compulsive Disorders:   Trauma-Stressor Disorders:   Substance/Addictive Disorders:  Alcohol Intoxication (303.00), Alcohol Intoxication with Use Disorder - Severe (F10.229) and Alcohol Withdrawal without Perceptual Disturbances (F10.239) Depressive Disorders:  Major Depressive Disorder - Unspecified (296.20)  Axis I: Substance Induced Mood Disorder and Alcohol dependence  ADL's:  Intact  Sleep: Good  Appetite:  Good  Suicidal Ideation:  denied Homicidal Ideation:  denied AEB (as evidenced by):  Psychiatric Specialty Exam: ROS  Blood pressure 107/72, pulse 59, temperature 96.9 F (36.1 C), temperature source Oral, resp. rate 18, height 5\' 2"  (1.575 m), weight 78.926 kg (174 lb), last menstrual period 06/24/2012.Body mass index is 31.82 kg/(m^2).  General Appearance: Casual  Eye Contact::  Fair  Speech:  Clear and Coherent  Volume:  Decreased  Mood:  Anxious and Depressed  Affect:  Depressed and Flat  Thought Process:  Goal Directed and Linear  Orientation:  Full (Time, Place, and Person)  Thought Content:  WDL  Suicidal Thoughts:  No  Homicidal Thoughts:  No  Memory:  Immediate;   Fair  Judgement:  Impaired  Insight:  Lacking  Psychomotor Activity:  Normal  Concentration:  Fair  Recall:  Fair  Akathisia:  NA  Handed:  Right  AIMS (if indicated):     Assets:   Communication Skills  Sleep:  Number of Hours: 6   Current Medications: Current Facility-Administered Medications  Medication Dose Route Frequency Provider Last Rate Last Dose  . acetaminophen (TYLENOL) tablet 650 mg  650 mg Oral Q6H PRN Court Joy, PA-C   650 mg at 07/21/13 9562  . alum & mag hydroxide-simeth (MAALOX/MYLANTA) 200-200-20 MG/5ML suspension 30 mL  30 mL Oral Q4H PRN Court Joy, PA-C   30 mL at 07/22/13 1623  . chlordiazePOXIDE (LIBRIUM) capsule 25 mg  25 mg Oral Q6H PRN Court Joy, PA-C   25 mg at 07/22/13 2109  . chlordiazePOXIDE (LIBRIUM) capsule 25 mg  25 mg Oral BH-qamhs Court Joy, PA-C       Followed by  . [START ON 07/25/2013] chlordiazePOXIDE (LIBRIUM) capsule 25 mg  25 mg Oral Daily Court Joy, PA-C      . FLUoxetine (PROZAC) capsule 10 mg  10 mg Oral Daily Verne Spurr, PA-C   10 mg at 07/23/13 1308  . hydrOXYzine (ATARAX/VISTARIL) tablet 25 mg  25 mg Oral Q6H PRN Court Joy, PA-C      . ibuprofen (ADVIL,MOTRIN) tablet 400 mg  400 mg Oral Q6H PRN Verne Spurr, PA-C   400 mg at 07/23/13 1208  . loperamide (IMODIUM) capsule 2-4 mg  2-4 mg Oral PRN Court Joy, PA-C      . magnesium hydroxide (MILK OF MAGNESIA) suspension 30 mL  30 mL Oral Daily PRN Court Joy, PA-C   30 mL at 07/22/13 1952  . meloxicam (MOBIC) tablet 7.5 mg  7.5 mg Oral Daily Verne Spurr, PA-C   7.5 mg at 07/23/13 1610  . miconazole (MONISTAT 7) 2 % vaginal cream 1 Applicatorful  1 Applicatorful Vaginal QHS Verne Spurr, PA-C   1 Applicatorful at 07/22/13 2111  . multivitamin with minerals tablet 1 tablet  1 tablet Oral Daily Court Joy, PA-C   1 tablet at 07/23/13 9604  . nicotine (NICODERM CQ - dosed in mg/24 hours) patch 21 mg  21 mg Transdermal Q0600 Verne Spurr, PA-C   21 mg at 07/23/13 1223  . ondansetron (ZOFRAN-ODT) disintegrating tablet 4 mg  4 mg Oral Q6H PRN Court Joy, PA-C   4 mg at 07/21/13 0335  . thiamine (B-1) injection 100 mg  100  mg Intramuscular Once Court Joy, PA-C      . thiamine (VITAMIN B-1) tablet 100 mg  100 mg Oral Daily Court Joy, PA-C   100 mg at 07/23/13 5409    Lab Results: No results found for this or any previous visit (from the past 48 hour(s)).  Physical Findings: AIMS: Facial and Oral Movements Muscles of Facial Expression: None, normal Lips and Perioral Area: None, normal Jaw: None, normal Tongue: None, normal,Extremity Movements Upper (arms, wrists, hands, fingers): None, normal Lower (legs, knees, ankles, toes): None, normal, Trunk Movements Neck, shoulders, hips: None, normal, Overall Severity Severity of abnormal movements (highest score from questions above): None, normal Incapacitation due to abnormal movements: None, normal Patient's awareness of abnormal movements (rate only patient's report): No Awareness, Dental Status Current problems with teeth and/or dentures?: No Does patient usually wear dentures?: No  CIWA:  CIWA-Ar Total: 0 COWS:     Treatment Plan Summary: Daily contact with patient to assess and evaluate symptoms and progress in treatment Medication management  Plan: Continue Librium protocol Treatment Plan/Recommendations:  1. Continue for crisis management and stabilization. 2. Medication management to reduce current symptoms to base line and improve the patient's overall level of functioning. 3. Treat health problems as indicated. 4. Develop treatment plan to decrease risk of relapse upon discharge and to reduce the need for readmission. 5. Psycho-social education regarding relapse prevention and self care. 6. Health care follow up as needed for medical problems. 7. Disposition plans in progress and may discharge tomorrow if continues to contract for safety.   Medical Decision Making Problem Points:  Established problem, worsening (2), Review of last therapy session (1) and Review of psycho-social stressors (1) Data Points:  Review or order clinical  lab tests (1) Review or order medicine tests (1) Review of medication regiment & side effects (2) Review of new medications or change in dosage (2)  I certify that inpatient services furnished can reasonably be expected to improve the patient's condition.   Alvia Tory,JANARDHAHA R. 07/23/2013, 4:00 PM

## 2013-07-23 NOTE — Progress Notes (Signed)
Adult Psychoeducational Group Note  Date:  07/23/2013 Time:  10:13 PM  Group Topic/Focus:  Wrap-Up Group:   The focus of this group is to help patients review their daily goal of treatment and discuss progress on daily workbooks.  Participation Level:  Active  Participation Quality:  Appropriate  Affect:  Appropriate  Cognitive:  Appropriate  Insight: Appropriate  Engagement in Group:  Engaged  Modes of Intervention:  Support  Additional Comments:  Learned that she doesn't have some disorder and that she gets to go home tomorrow and most importantly she learned that she to not pick up the alcohol anymore. Meet more friends and talk more she says that she plans to concentrate on her hobbies which are cooking.   Amberlee Garvey 07/23/2013, 10:13 PM

## 2013-07-23 NOTE — Progress Notes (Signed)
Nutrition Brief Note  Patient identified on the Malnutrition Screening Tool (MST) Report.  Wt Readings from Last 10 Encounters:  07/21/13 174 lb (78.926 kg)   Body mass index is 31.82 kg/(m^2). Patient meets criteria for class I obesity based on current BMI.   Discussed intake PTA with patient and compared to intake presently.  Encouraged adequate intake of meals and snacks. Current diet order is regular and pt is also offered choice of unit snacks mid-morning and mid-afternoon.  Pt is eating as desired.   Pt admitted with alcohol dependence. Per H&P, pt was drinking 10 (40 ounce) beers everyday for the past year. Also has history of occasionally using crack cocaine 3 or 4 times/year. Met with pt who reports eating only 1 meal/day PTA, typically a sandwich. Stated she wasn't eating more than that because she was drinking. States her usual weight is 180 pounds, now weighs 174 pounds. Unsure of the time frame of her weight loss.   Labs and medications reviewed. Pt with elevated AST/ALT. Pt getting daily multivitamin and thiamine.   Nutrition Dx:  Unintended wt change r/t suboptimal oral intake AEB pt report  Interventions:   Discussed the importance of nutrition and encouraged intake of food and beverages. Encouraged pt to continue to take a daily multivitamin after discharge.   No additional nutrition interventions warranted at this time. If nutrition issues arise, please consult RD.   Levon Hedger MS, RD, LDN [phone] Pager [phone] After Hours Pager

## 2013-07-24 MED ORDER — MELOXICAM 7.5 MG PO TABS: 7.5000 mg | ORAL_TABLET | Freq: Every day | ORAL | Status: DC

## 2013-07-24 MED ORDER — FLUOXETINE HCL 10 MG PO CAPS: 10.0000 mg | ORAL_CAPSULE | Freq: Every day | ORAL | Status: DC

## 2013-07-24 NOTE — Progress Notes (Signed)
The focus of this group is to educate the patient on the purpose and policies of crisis stabilization and provide a format to answer questions about their admission.  The group details unit policies and expectations of patients while admitted.  Patient attended 0900 nurse education orientation group this morning.  Patient actively participated, appropriate affect, alert, appropriate insight and engagement.  Today patient will work on 3 goals for discharge.  

## 2013-07-24 NOTE — Progress Notes (Signed)
Recreation Therapy Notes  Date: 10.13.2014 Time: 3:00pm Location: 500 Hall Dayroom  Group Topic: Communication, Team Building, Problem Solving  Goal Area(s) Addresses:  Patient will effectively work with peer towards shared goal.  Patient will identify skill used to make activity successful.  Patient will identify how skills used during activity can be used to reach post d/c goals.   Behavioral Response: Appropriate   Intervention: Problem Solving Activitiy  Activity: Life Boat. Patients were given a scenario about being on a sinking yacht. Patients were informed the yacht included 15 guest, 8 of which could be placed on the life boat, along with all group members. Individuals on guest list were of varying socioeconomic classes such as a Education officer, museum, Materials engineer, Midwife, Tree surgeon.   Education: Customer service manager, Discharge Planning   Education Outcome: Acknowledges understanding  Clinical Observations/Feedback: Patient attended group session, but made no contributions to group discussion or activity.  Marykay Lex Algenis Ballin, LRT/CTRS  Amber Guthridge L 07/24/2013 9:24 AM

## 2013-07-24 NOTE — Progress Notes (Signed)
[facility] Adult Case Management Discharge Plan :  Will you be returning to the same living situation after discharge: Yes,  pt was homeless prior to admission and will continue to be homeless.  Pt states that she stays with friends At discharge, do you have transportation home?:Yes,  access to transportation Do you have the ability to pay for your medications:Yes,  access to meds  Release of information consent forms completed and in the chart;  Patient's signature needed at discharge.  Patient to Follow up at: Follow-up Information   Follow up with [facility] On 07/26/2013. (Arrive at 8:00 am promptly for screening assessment only.  Date given by French Ana.  They will give you the admission date at this appointment.)    Contact information:   [address] [place], [place] [address] Phone: [phone] Fax: [phone]      Follow up with [facility]. (Walk in for medication management and therapy.  Walk in clinic is Monday - Friday 8 am - 3 pm.  )    Contact information:   [address], [place] [address] Phone: [phone] Fax: [phone]      Patient denies SI/HI:   Yes,  denies SI/HI    Safety Planning and Suicide Prevention discussed:  Yes,  discussed with pt.  N/A to contact family/friend due to no SI on admission.  See suicide prevention education note.  Carmina Miller 07/24/2013, 1:01 PM

## 2013-07-24 NOTE — Progress Notes (Signed)
Patient ID: Peggy Warren, female   DOB: [birth year], 47 y.o.   MRN: [id number] D- Patient is discharged ambulatory to take bus to friend's house.  Her brother is supportive.  She denies SI/HI.  She was given supply of meds and scripts by MD.  She verbalizes understanding of her d/c meds and followup.  Patient is planning to go to 6 pm meeting tonight.
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
## 2013-07-24 NOTE — Progress Notes (Signed)
Pt. Resting quietly.  Resp even unlabored.  NO sign of distress or discomfort noted. 
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
## 2013-07-27 NOTE — Progress Notes (Signed)
Patient Discharge Instructions:  After Visit Summary (AVS):   Faxed to:  07/27/13 Discharge Summary Note:   Faxed to:  07/27/13 Psychiatric Admission Assessment Note:   Faxed to:  07/27/13 Suicide Risk Assessment - Discharge Assessment:   Faxed to:  07/27/13 Faxed/Sent to the Next Level Care provider:  07/27/13 Faxed to [facility] @ [phone] Faxed to [facility] @ [phone]  Jerelene Redden, 07/27/2013, 2:18 PM

## 2013-12-09 ENCOUNTER — Encounter (HOSPITAL_COMMUNITY): Payer: Self-pay | Admitting: Emergency Medicine

## 2013-12-09 ENCOUNTER — Emergency Department (HOSPITAL_COMMUNITY)
Admission: EM | Admit: 2013-12-09 | Discharge: 2013-12-09 | Disposition: A | Discharge status: Home / Self Care | Payer: Self-pay | Attending: Emergency Medicine | Admitting: Emergency Medicine

## 2013-12-09 DIAGNOSIS — S8990XA Unspecified injury of unspecified lower leg, initial encounter: Secondary | ICD-10-CM | POA: Insufficient documentation

## 2013-12-09 DIAGNOSIS — Z8719 Personal history of other diseases of the digestive system: Secondary | ICD-10-CM | POA: Insufficient documentation

## 2013-12-09 DIAGNOSIS — Z8701 Personal history of pneumonia (recurrent): Secondary | ICD-10-CM | POA: Insufficient documentation

## 2013-12-09 DIAGNOSIS — Z79899 Other long term (current) drug therapy: Secondary | ICD-10-CM | POA: Insufficient documentation

## 2013-12-09 DIAGNOSIS — S99919A Unspecified injury of unspecified ankle, initial encounter: Principal | ICD-10-CM

## 2013-12-09 DIAGNOSIS — Y929 Unspecified place or not applicable: Secondary | ICD-10-CM | POA: Insufficient documentation

## 2013-12-09 DIAGNOSIS — J449 Chronic obstructive pulmonary disease, unspecified: Secondary | ICD-10-CM | POA: Insufficient documentation

## 2013-12-09 DIAGNOSIS — Y9301 Activity, walking, marching and hiking: Secondary | ICD-10-CM | POA: Insufficient documentation

## 2013-12-09 DIAGNOSIS — F17200 Nicotine dependence, unspecified, uncomplicated: Secondary | ICD-10-CM | POA: Insufficient documentation

## 2013-12-09 DIAGNOSIS — Z791 Long term (current) use of non-steroidal anti-inflammatories (NSAID): Secondary | ICD-10-CM | POA: Insufficient documentation

## 2013-12-09 DIAGNOSIS — X500XXA Overexertion from strenuous movement or load, initial encounter: Secondary | ICD-10-CM | POA: Insufficient documentation

## 2013-12-09 DIAGNOSIS — Z87828 Personal history of other (healed) physical injury and trauma: Secondary | ICD-10-CM | POA: Insufficient documentation

## 2013-12-09 DIAGNOSIS — M79604 Pain in right leg: Secondary | ICD-10-CM

## 2013-12-09 DIAGNOSIS — J4489 Other specified chronic obstructive pulmonary disease: Secondary | ICD-10-CM | POA: Insufficient documentation

## 2013-12-09 DIAGNOSIS — S99929A Unspecified injury of unspecified foot, initial encounter: Principal | ICD-10-CM

## 2013-12-09 LAB — URINALYSIS, ROUTINE W REFLEX MICROSCOPIC
Bilirubin Urine: NEGATIVE
Glucose, UA: NEGATIVE mg/dL
HGB URINE DIPSTICK: NEGATIVE
KETONES UR: NEGATIVE mg/dL
Leukocytes, UA: NEGATIVE
Nitrite: NEGATIVE
PROTEIN: NEGATIVE mg/dL
Specific Gravity, Urine: 1.026 (ref 1.005–1.030)
Urobilinogen, UA: 1 mg/dL (ref 0.0–1.0)
pH: 7 (ref 5.0–8.0)

## 2013-12-09 MED ORDER — TRAMADOL HCL 50 MG PO TABS
50.0000 mg | ORAL_TABLET | Freq: Four times a day (QID) | ORAL | Status: DC | PRN
Start: 2013-12-09 — End: 2014-03-31

## 2013-12-09 MED ORDER — TRAMADOL HCL 50 MG PO TABS
50.0000 mg | ORAL_TABLET | Freq: Once | ORAL | Status: AC
  Administered 2013-12-09: 50 mg via ORAL
  Filled 2013-12-09: qty 1

## 2013-12-09 MED ORDER — METHOCARBAMOL 500 MG PO TABS: 500.0000 mg | ORAL_TABLET | Freq: Two times a day (BID) | ORAL | Status: DC

## 2013-12-09 NOTE — ED Notes (Addendum)
Pt presents with right sided hip pain X 1 month. Pt states that she seen at this facility diagnosed with"arthritis" 1 month ago. Pt states that the pain radiates down her right leg describes pain as "burning". Pt denies any pain with urination.

## 2013-12-09 NOTE — Discharge Instructions (Signed)
Please follow up closely with an orthopedist doctor for further care of your pain if no improvement in 1 week.    Musculoskeletal Pain Musculoskeletal pain is muscle and boney aches and pains. These pains can occur in any part of the body. Your caregiver may treat you without knowing the cause of the pain. They may treat you if blood or urine tests, X-rays, and other tests were normal.  CAUSES There is often not a definite cause or reason for these pains. These pains may be caused by a type of germ (virus). The discomfort may also come from overuse. Overuse includes working out too hard when your body is not fit. Boney aches also come from weather changes. Bone is sensitive to atmospheric pressure changes. HOME CARE INSTRUCTIONS   Ask when your test results will be ready. Make sure you get your test results.  Only take over-the-counter or prescription medicines for pain, discomfort, or fever as directed by your caregiver. If you were given medications for your condition, do not drive, operate machinery or power tools, or sign legal documents for 24 hours. Do not drink alcohol. Do not take sleeping pills or other medications that may interfere with treatment.  Continue all activities unless the activities cause more pain. When the pain lessens, slowly resume normal activities. Gradually increase the intensity and duration of the activities or exercise.  During periods of severe pain, bed rest may be helpful. Lay or sit in any position that is comfortable.  Putting ice on the injured area.  Put ice in a bag.  Place a towel between your skin and the bag.  Leave the ice on for 15 to 20 minutes, 3 to 4 times a day.  Follow up with your caregiver for continued problems and no reason can be found for the pain. If the pain becomes worse or does not go away, it may be necessary to repeat tests or do additional testing. Your caregiver may need to look further for a possible cause. SEEK IMMEDIATE  MEDICAL CARE IF:  You have pain that is getting worse and is not relieved by medications.  You develop chest pain that is associated with shortness or breath, sweating, feeling sick to your stomach (nauseous), or throw up (vomit).  Your pain becomes localized to the abdomen.  You develop any new symptoms that seem different or that concern you. MAKE SURE YOU:   Understand these instructions.  Will watch your condition.  Will get help right away if you are not doing well or get worse. Document Released: 09/27/2005 Document Revised: 12/20/2011 Document Reviewed: 06/01/2013 [facility] Patient Information 2014 [place]ExitCare, [place].
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
## 2013-12-09 NOTE — ED Provider Notes (Signed)
Medical screening examination/treatment/procedure(s) were performed by non-physician practitioner and as supervising physician I was immediately available for consultation/collaboration.  Haedyn Breau, MD 12/09/13 1524 

## 2014-03-31 ENCOUNTER — Emergency Department (HOSPITAL_COMMUNITY): Payer: Self-pay

## 2014-03-31 ENCOUNTER — Encounter (HOSPITAL_COMMUNITY): Payer: Self-pay | Admitting: Emergency Medicine

## 2014-03-31 ENCOUNTER — Emergency Department (HOSPITAL_COMMUNITY)
Admission: EM | Admit: 2014-03-31 | Discharge: 2014-03-31 | Disposition: A | Discharge status: Home / Self Care | Payer: Self-pay | Attending: Emergency Medicine | Admitting: Emergency Medicine

## 2014-03-31 DIAGNOSIS — J4489 Other specified chronic obstructive pulmonary disease: Secondary | ICD-10-CM | POA: Insufficient documentation

## 2014-03-31 DIAGNOSIS — Z791 Long term (current) use of non-steroidal anti-inflammatories (NSAID): Secondary | ICD-10-CM | POA: Insufficient documentation

## 2014-03-31 DIAGNOSIS — J449 Chronic obstructive pulmonary disease, unspecified: Secondary | ICD-10-CM | POA: Insufficient documentation

## 2014-03-31 DIAGNOSIS — Z8701 Personal history of pneumonia (recurrent): Secondary | ICD-10-CM | POA: Insufficient documentation

## 2014-03-31 DIAGNOSIS — R0789 Other chest pain: Secondary | ICD-10-CM | POA: Insufficient documentation

## 2014-03-31 DIAGNOSIS — Z8719 Personal history of other diseases of the digestive system: Secondary | ICD-10-CM | POA: Insufficient documentation

## 2014-03-31 DIAGNOSIS — Z79899 Other long term (current) drug therapy: Secondary | ICD-10-CM | POA: Insufficient documentation

## 2014-03-31 DIAGNOSIS — R079 Chest pain, unspecified: Secondary | ICD-10-CM

## 2014-03-31 DIAGNOSIS — R110 Nausea: Secondary | ICD-10-CM | POA: Insufficient documentation

## 2014-03-31 DIAGNOSIS — F17200 Nicotine dependence, unspecified, uncomplicated: Secondary | ICD-10-CM | POA: Insufficient documentation

## 2014-03-31 LAB — I-STAT TROPONIN, ED
Troponin i, poc: 0 ng/mL (ref 0.00–0.08)
Troponin i, poc: 0.01 ng/mL (ref 0.00–0.08)

## 2014-03-31 LAB — CBC
HEMATOCRIT: 42.1 % (ref 36.0–46.0)
Hemoglobin: 14.6 g/dL (ref 12.0–15.0)
MCH: 33.4 pg (ref 26.0–34.0)
MCHC: 34.7 g/dL (ref 30.0–36.0)
MCV: 96.3 fL (ref 78.0–100.0)
Platelets: 275 10*3/uL (ref 150–400)
RBC: 4.37 MIL/uL (ref 3.87–5.11)
RDW: 14 % (ref 11.5–15.5)
WBC: 6.2 10*3/uL (ref 4.0–10.5)

## 2014-03-31 LAB — BASIC METABOLIC PANEL
BUN: 7 mg/dL (ref 6–23)
CHLORIDE: 103 meq/L (ref 96–112)
CO2: 21 mEq/L (ref 19–32)
Calcium: 9.1 mg/dL (ref 8.4–10.5)
Creatinine, Ser: 0.53 mg/dL (ref 0.50–1.10)
Glucose, Bld: 90 mg/dL (ref 70–99)
Potassium: 4 mEq/L (ref 3.7–5.3)
Sodium: 139 mEq/L (ref 137–147)

## 2014-03-31 MED ORDER — DIAZEPAM 5 MG PO TABS: 5.0000 mg | ORAL_TABLET | Freq: Two times a day (BID) | ORAL | Status: DC

## 2014-03-31 MED ORDER — IOHEXOL 300 MG/ML  SOLN
80.0000 mL | Freq: Once | INTRAMUSCULAR | Status: AC | PRN
  Administered 2014-03-31: 80 mL via INTRAVENOUS

## 2014-03-31 MED ORDER — MORPHINE SULFATE 4 MG/ML IJ SOLN
4.0000 mg | Freq: Once | INTRAMUSCULAR | Status: AC
  Administered 2014-03-31: 4 mg via INTRAVENOUS
  Filled 2014-03-31: qty 1

## 2014-03-31 MED ORDER — ONDANSETRON HCL 4 MG/2ML IJ SOLN
4.0000 mg | Freq: Once | INTRAMUSCULAR | Status: AC
  Administered 2014-03-31: 4 mg via INTRAVENOUS
  Filled 2014-03-31: qty 2

## 2014-03-31 MED ORDER — DIAZEPAM 5 MG PO TABS
5.0000 mg | ORAL_TABLET | Freq: Once | ORAL | Status: AC
  Administered 2014-03-31: 5 mg via ORAL
  Filled 2014-03-31: qty 1
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
## 2014-03-31 NOTE — ED Notes (Addendum)
Pt reports intermittent left sided chest "tightness" x 2 days that radiates to left arms and bilateral legs. States pain 8/10, "it feels like a knot." Pt denies SOB, but does report intermittent diaphoresis, nausea and lightheadedness. Denies abdominal pain. NAD.

## 2014-03-31 NOTE — Discharge Instructions (Signed)
Chest Pain (Nonspecific) °It is often hard to give a specific diagnosis for the cause of chest pain. There is always a chance that your pain could be related to something serious, such as a heart attack or a blood clot in the lungs. You need to follow up with your health care provider for further evaluation. °CAUSES  °· Heartburn. °· Pneumonia or bronchitis. °· Anxiety or stress. °· Inflammation around your heart (pericarditis) or lung (pleuritis or pleurisy). °· A blood clot in the lung. °· A collapsed lung (pneumothorax). It can develop suddenly on its own (spontaneous pneumothorax) or from trauma to the chest. °· Shingles infection (herpes zoster virus). °The chest wall is composed of bones, muscles, and cartilage. Any of these can be the source of the pain. °· The bones can be bruised by injury. °· The muscles or cartilage can be strained by coughing or overwork. °· The cartilage can be affected by inflammation and become sore (costochondritis). °DIAGNOSIS  °Lab tests or other studies may be needed to find the cause of your pain. Your health care provider may have you take a test called an ambulatory electrocardiogram (ECG). An ECG records your heartbeat patterns over a 24-hour period. You may also have other tests, such as: °· Transthoracic echocardiogram (TTE). During echocardiography, sound waves are used to evaluate how blood flows through your heart. °· Transesophageal echocardiogram (TEE). °· Cardiac monitoring. This allows your health care provider to monitor your heart rate and rhythm in real time. °· Holter monitor. This is a portable device that records your heartbeat and can help diagnose heart arrhythmias. It allows your health care provider to track your heart activity for several days, if needed. °· Stress tests by exercise or by giving medicine that makes the heart beat faster. °TREATMENT  °· Treatment depends on what may be causing your chest pain. Treatment may include: °· Acid blockers for  heartburn. °· Anti-inflammatory medicine. °· Pain medicine for inflammatory conditions. °· Antibiotics if an infection is present. °· You may be advised to change lifestyle habits. This includes stopping smoking and avoiding alcohol, caffeine, and chocolate. °· You may be advised to keep your head raised (elevated) when sleeping. This reduces the chance of acid going backward from your stomach into your esophagus. °Most of the time, nonspecific chest pain will improve within 2-3 days with rest and mild pain medicine.  °HOME CARE INSTRUCTIONS  °· If antibiotics were prescribed, take them as directed. Finish them even if you start to feel better. °· For the next few days, avoid physical activities that bring on chest pain. Continue physical activities as directed. °· Do not use any tobacco products, including cigarettes, chewing tobacco, or electronic cigarettes. °· Avoid drinking alcohol. °· Only take medicine as directed by your health care provider. °· Follow your health care provider's suggestions for further testing if your chest pain does not go away. °· Keep any follow-up appointments you made. If you do not go to an appointment, you could develop lasting (chronic) problems with pain. If there is any problem keeping an appointment, call to reschedule. °SEEK MEDICAL CARE IF:  °· Your chest pain does not go away, even after treatment. °· You have a rash with blisters on your chest. °· You have a fever. °SEEK IMMEDIATE MEDICAL CARE IF:  °· You have increased chest pain or pain that spreads to your arm, neck, jaw, back, or abdomen. °· You have shortness of breath. °· You have an increasing cough, or you cough   up blood. °· You have severe back or abdominal pain. °· You feel nauseous or vomit. °· You have severe weakness. °· You faint. °· You have chills. °This is an emergency. Do not wait to see if the pain will go away. Get medical help at once. Call your local emergency services (911 in [place] Do not drive  yourself to the hospital. °MAKE SURE YOU:  °· Understand these instructions. °· Will watch your condition. °· Will get help right away if you are not doing well or get worse. °Document Released: 07/07/2005 Document Revised: 10/02/2013 Document Reviewed: 05/02/2008 °ExitCare® Patient Information ©2015 [employer]. This information is not intended to replace advice given to you by your health care provider. Make sure you discuss any questions you have with your health care provider. ° °Chest Wall Pain °Chest wall pain is pain in or around the bones and muscles of your chest. It may take up to 6 weeks to get better. It may take longer if you must stay physically active in your work and activities.  °CAUSES  °Chest wall pain may happen on its own. However, it may be caused by: °· A viral illness like the flu. °· Injury. °· Coughing. °· Exercise. °· Arthritis. °· Fibromyalgia. °· Shingles. °HOME CARE INSTRUCTIONS  °· Avoid overtiring physical activity. Try not to strain or perform activities that cause pain. This includes any activities using your chest or your abdominal and side muscles, especially if heavy weights are used. °· Put ice on the sore area. °¨ Put ice in a plastic bag. °¨ Place a towel between your skin and the bag. °¨ Leave the ice on for 15-20 minutes per hour while awake for the first 2 days. °· Only take over-the-counter or prescription medicines for pain, discomfort, or fever as directed by your caregiver. °SEEK IMMEDIATE MEDICAL CARE IF:  °· Your pain increases, or you are very uncomfortable. °· You have a fever. °· Your chest pain becomes worse. °· You have new, unexplained symptoms. °· You have nausea or vomiting. °· You feel sweaty or lightheaded. °· You have a cough with phlegm (sputum), or you cough up blood. °MAKE SURE YOU:  °· Understand these instructions. °· Will watch your condition. °· Will get help right away if you are not doing well or get worse. °Document Released: 09/27/2005 Document  Revised: 12/20/2011 Document Reviewed: 05/24/2011 °ExitCare® Patient Information ©2015 [employer]. This information is not intended to replace advice given to you by your health care provider. Make sure you discuss any questions you have with your health care provider. ° °

## 2014-04-01 NOTE — ED Provider Notes (Signed)
Medical screening examination/treatment/procedure(s) were performed by non-physician practitioner and as supervising physician I was immediately available for consultation/collaboration.   EKG Interpretation   Date/Time:  Sunday March 31 2014 11:58:21 EDT Ventricular Rate:  83 PR Interval:  144 QRS Duration: 88 QT Interval:  376 QTC Calculation: 441 R Axis:   62 Text Interpretation:  Normal sinus rhythm Normal ECG No significant change  since last tracing Confirmed by [facility]EDNAR  MD, Jonny RuizJOHN ([id number]) on 04/01/2014  2:40:46 PM       Peggy HornJohn M Domnick Chervenak, MD 04/01/14 1441

## 2014-06-01 ENCOUNTER — Emergency Department (HOSPITAL_COMMUNITY)
Admission: EM | Admit: 2014-06-01 | Discharge: 2014-06-01 | Disposition: A | Discharge status: Home / Self Care | Payer: Self-pay | Attending: Emergency Medicine | Admitting: Emergency Medicine

## 2014-06-01 ENCOUNTER — Encounter (HOSPITAL_COMMUNITY): Payer: Self-pay | Admitting: Emergency Medicine

## 2014-06-01 ENCOUNTER — Emergency Department (HOSPITAL_COMMUNITY): Payer: Self-pay

## 2014-06-01 DIAGNOSIS — J449 Chronic obstructive pulmonary disease, unspecified: Secondary | ICD-10-CM | POA: Insufficient documentation

## 2014-06-01 DIAGNOSIS — Z8719 Personal history of other diseases of the digestive system: Secondary | ICD-10-CM | POA: Insufficient documentation

## 2014-06-01 DIAGNOSIS — Z8701 Personal history of pneumonia (recurrent): Secondary | ICD-10-CM | POA: Insufficient documentation

## 2014-06-01 DIAGNOSIS — R05 Cough: Secondary | ICD-10-CM

## 2014-06-01 DIAGNOSIS — R059 Cough, unspecified: Secondary | ICD-10-CM | POA: Insufficient documentation

## 2014-06-01 DIAGNOSIS — J4489 Other specified chronic obstructive pulmonary disease: Secondary | ICD-10-CM | POA: Insufficient documentation

## 2014-06-01 DIAGNOSIS — F17200 Nicotine dependence, unspecified, uncomplicated: Secondary | ICD-10-CM | POA: Insufficient documentation

## 2014-06-01 LAB — CBC WITH DIFFERENTIAL/PLATELET
Basophils Absolute: 0 10*3/uL (ref 0.0–0.1)
Basophils Relative: 0 % (ref 0–1)
Eosinophils Absolute: 0 10*3/uL (ref 0.0–0.7)
Eosinophils Relative: 1 % (ref 0–5)
HCT: 42.4 % (ref 36.0–46.0)
HEMOGLOBIN: 14.6 g/dL (ref 12.0–15.0)
LYMPHS ABS: 1.5 10*3/uL (ref 0.7–4.0)
LYMPHS PCT: 29 % (ref 12–46)
MCH: 34.2 pg — ABNORMAL HIGH (ref 26.0–34.0)
MCHC: 34.4 g/dL (ref 30.0–36.0)
MCV: 99.3 fL (ref 78.0–100.0)
MONOS PCT: 7 % (ref 3–12)
Monocytes Absolute: 0.4 10*3/uL (ref 0.1–1.0)
NEUTROS ABS: 3.3 10*3/uL (ref 1.7–7.7)
NEUTROS PCT: 63 % (ref 43–77)
PLATELETS: 208 10*3/uL (ref 150–400)
RBC: 4.27 MIL/uL (ref 3.87–5.11)
RDW: 13.2 % (ref 11.5–15.5)
WBC: 5.3 10*3/uL (ref 4.0–10.5)

## 2014-06-01 LAB — BASIC METABOLIC PANEL
Anion gap: 15 (ref 5–15)
BUN: 5 mg/dL — AB (ref 6–23)
CHLORIDE: 101 meq/L (ref 96–112)
CO2: 20 meq/L (ref 19–32)
Calcium: 8.9 mg/dL (ref 8.4–10.5)
Creatinine, Ser: 0.53 mg/dL (ref 0.50–1.10)
GFR calc Af Amer: >90 mL/min (ref >90)
GFR calc non Af Amer: >90 mL/min (ref >90)
GLUCOSE: 99 mg/dL (ref 70–99)
POTASSIUM: 4.1 meq/L (ref 3.7–5.3)
SODIUM: 136 meq/L — AB (ref 137–147)

## 2014-06-01 MED ORDER — AZITHROMYCIN 250 MG PO TABS: 250.0000 mg | ORAL_TABLET | Freq: Every day | ORAL | Status: DC

## 2014-06-01 MED ORDER — HYDROCOD POLST-CHLORPHEN POLST 10-8 MG/5ML PO LQCR: 5.0000 mL | Freq: Two times a day (BID) | ORAL | Status: DC | PRN

## 2014-06-01 NOTE — Discharge Instructions (Signed)
Cough, Adult  A cough is a reflex that helps clear your throat and airways. It can help heal the body or may be a reaction to an irritated airway. A cough may only last 2 or 3 weeks (acute) or may last more than 8 weeks (chronic).  CAUSES Acute cough:  Viral or bacterial infections. Chronic cough:  Infections.  Allergies.  Asthma.  Post-nasal drip.  Smoking.  Heartburn or acid reflux.  Some medicines.  Chronic lung problems (COPD).  Cancer. SYMPTOMS   Cough.  Fever.  Chest pain.  Increased breathing rate.  High-pitched whistling sound when breathing (wheezing).  Colored mucus that you cough up (sputum). TREATMENT   A bacterial cough may be treated with antibiotic medicine.  A viral cough must run its course and will not respond to antibiotics.  Your caregiver may recommend other treatments if you have a chronic cough. HOME CARE INSTRUCTIONS   Only take over-the-counter or prescription medicines for pain, discomfort, or fever as directed by your caregiver. Use cough suppressants only as directed by your caregiver.  Use a cold steam vaporizer or humidifier in your bedroom or home to help loosen secretions.  Sleep in a semi-upright position if your cough is worse at night.  Rest as needed.  Stop smoking if you smoke. SEEK IMMEDIATE MEDICAL CARE IF:   You have pus in your sputum.  Your cough starts to worsen.  You cannot control your cough with suppressants and are losing sleep.  You begin coughing up blood.  You have difficulty breathing.  You develop pain which is getting worse or is uncontrolled with medicine.  You have a fever. MAKE SURE YOU:   Understand these instructions.  Will watch your condition.  Will get help right away if you are not doing well or get worse. Document Released: 03/26/2011 Document Revised: 12/20/2011 Document Reviewed: 03/26/2011 [employer] Patient Information 2015 [employer]. This information is not intended  to replace advice given to you by your health care provider. Make sure you discuss any questions you have with your health care provider.  
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
## 2014-06-01 NOTE — ED Notes (Signed)
Patient presents with c/o pain to her ribs from coughing.  States she has been having this cough for 2 weeks

## 2014-10-18 ENCOUNTER — Encounter (HOSPITAL_COMMUNITY): Payer: Self-pay | Admitting: *Deleted

## 2014-10-18 ENCOUNTER — Emergency Department (HOSPITAL_COMMUNITY): Payer: Self-pay

## 2014-10-18 ENCOUNTER — Emergency Department (HOSPITAL_COMMUNITY)
Admission: EM | Admit: 2014-10-18 | Discharge: 2014-10-19 | Disposition: A | Discharge status: Home / Self Care | Payer: Self-pay | Attending: Emergency Medicine | Admitting: Emergency Medicine

## 2014-10-18 DIAGNOSIS — Z87828 Personal history of other (healed) physical injury and trauma: Secondary | ICD-10-CM | POA: Insufficient documentation

## 2014-10-18 DIAGNOSIS — J189 Pneumonia, unspecified organism: Secondary | ICD-10-CM | POA: Insufficient documentation

## 2014-10-18 DIAGNOSIS — Z8719 Personal history of other diseases of the digestive system: Secondary | ICD-10-CM | POA: Insufficient documentation

## 2014-10-18 DIAGNOSIS — J441 Chronic obstructive pulmonary disease with (acute) exacerbation: Secondary | ICD-10-CM | POA: Insufficient documentation

## 2014-10-18 DIAGNOSIS — Z720 Tobacco use: Secondary | ICD-10-CM | POA: Insufficient documentation

## 2014-10-18 HISTORY — DX: Bronchitis, not specified as acute or chronic: J40

## 2014-10-18 LAB — CBC WITH DIFFERENTIAL/PLATELET
Basophils Absolute: 0 10*3/uL (ref 0.0–0.1)
Basophils Relative: 1 % (ref 0–1)
Eosinophils Absolute: 0.1 10*3/uL (ref 0.0–0.7)
Eosinophils Relative: 1 % (ref 0–5)
HCT: 43.9 % (ref 36.0–46.0)
Hemoglobin: 14.9 g/dL (ref 12.0–15.0)
LYMPHS PCT: 49 % — AB (ref 12–46)
Lymphs Abs: 2.6 10*3/uL (ref 0.7–4.0)
MCH: 33.4 pg (ref 26.0–34.0)
MCHC: 33.9 g/dL (ref 30.0–36.0)
MCV: 98.4 fL (ref 78.0–100.0)
Monocytes Absolute: 0.4 10*3/uL (ref 0.1–1.0)
Monocytes Relative: 7 % (ref 3–12)
Neutro Abs: 2.2 10*3/uL (ref 1.7–7.7)
Neutrophils Relative %: 42 % — ABNORMAL LOW (ref 43–77)
Platelets: 317 10*3/uL (ref 150–400)
RBC: 4.46 MIL/uL (ref 3.87–5.11)
RDW: 12.1 % (ref 11.5–15.5)
WBC: 5.2 10*3/uL (ref 4.0–10.5)

## 2014-10-18 LAB — BASIC METABOLIC PANEL
ANION GAP: 11 (ref 5–15)
BUN: <5 mg/dL — ABNORMAL LOW (ref 6–23)
CHLORIDE: 104 meq/L (ref 96–112)
CO2: 24 mmol/L (ref 19–32)
Calcium: 9.1 mg/dL (ref 8.4–10.5)
Creatinine, Ser: 0.61 mg/dL (ref 0.50–1.10)
GFR calc non Af Amer: >90 mL/min (ref >90)
GLUCOSE: 94 mg/dL (ref 70–99)
POTASSIUM: 3.1 mmol/L — AB (ref 3.5–5.1)
SODIUM: 139 mmol/L (ref 135–145)

## 2014-10-18 LAB — RAPID URINE DRUG SCREEN, HOSP PERFORMED
Amphetamines: NOT DETECTED
Barbiturates: NOT DETECTED
Benzodiazepines: NOT DETECTED
Cocaine: POSITIVE — AB
Opiates: NOT DETECTED
Tetrahydrocannabinol: NOT DETECTED

## 2014-10-18 LAB — URINALYSIS, ROUTINE W REFLEX MICROSCOPIC
BILIRUBIN URINE: NEGATIVE
GLUCOSE, UA: NEGATIVE mg/dL
HGB URINE DIPSTICK: NEGATIVE
Ketones, ur: NEGATIVE mg/dL
Leukocytes, UA: NEGATIVE
Nitrite: NEGATIVE
Protein, ur: NEGATIVE mg/dL
Specific Gravity, Urine: 1.008 (ref 1.005–1.030)
UROBILINOGEN UA: 0.2 mg/dL (ref 0.0–1.0)
pH: 5 (ref 5.0–8.0)

## 2014-10-18 LAB — I-STAT TROPONIN, ED: Troponin i, poc: 0 ng/mL (ref 0.00–0.08)

## 2014-10-18 MED ORDER — ONDANSETRON HCL 4 MG/2ML IJ SOLN
4.0000 mg | Freq: Once | INTRAMUSCULAR | Status: AC
  Administered 2014-10-18: 4 mg via INTRAVENOUS
  Filled 2014-10-18: qty 2

## 2014-10-18 MED ORDER — MORPHINE SULFATE 4 MG/ML IJ SOLN
4.0000 mg | Freq: Once | INTRAMUSCULAR | Status: AC
  Administered 2014-10-18: 4 mg via INTRAVENOUS
  Filled 2014-10-18: qty 1

## 2014-10-18 MED ORDER — POTASSIUM CHLORIDE CRYS ER 20 MEQ PO TBCR
40.0000 meq | EXTENDED_RELEASE_TABLET | Freq: Once | ORAL | Status: AC
  Administered 2014-10-19: 40 meq via ORAL
  Filled 2014-10-18 (×2): qty 2

## 2014-10-18 NOTE — ED Notes (Signed)
Pt to xray at this time.
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
## 2014-10-18 NOTE — ED Notes (Signed)
Pt c/o productive cough. Pt denies urinary symptoms; has been having constipation and hard stools at home

## 2014-10-18 NOTE — ED Notes (Signed)
Pt to ED via [facility] c/o cough x 2 weeks and r sided flank pain. Pt thinks "I have pneumonia. I only hurt when I'm coughing. Pt has been taking BC powder and mucinex without relief

## 2014-10-19 LAB — D-DIMER, QUANTITATIVE: D-Dimer, Quant: 0.4 ug/mL-FEU (ref 0.00–0.48)

## 2014-10-19 MED ORDER — PREDNISONE 20 MG PO TABS: 40.0000 mg | ORAL_TABLET | Freq: Every day | ORAL | Status: DC

## 2014-10-19 MED ORDER — AZITHROMYCIN 250 MG PO TABS: 250.0000 mg | ORAL_TABLET | Freq: Every day | ORAL | Status: DC

## 2014-10-19 NOTE — Discharge Instructions (Signed)
Take azithromycin as directed until gone. Take delsym as needed for cough. Refer to attached documents for more information. Return to the ED with worsening or concerning symptoms.

## 2014-10-19 NOTE — ED Notes (Signed)
While ambulating pt, O2 sat stayed at 95% while walking. Pt did state that she felt light headed while ambulating.

## 2014-10-19 NOTE — ED Notes (Signed)
Spoke to Peggy Warren in lab concerning d-dimer; specimen in lab and is to be ran at this time

## 2015-03-30 ENCOUNTER — Encounter (HOSPITAL_COMMUNITY): Payer: Self-pay

## 2015-03-30 ENCOUNTER — Emergency Department (HOSPITAL_COMMUNITY)
Admission: EM | Admit: 2015-03-30 | Discharge: 2015-03-30 | Disposition: A | Discharge status: Home / Self Care | Payer: Self-pay | Attending: Emergency Medicine | Admitting: Emergency Medicine

## 2015-03-30 DIAGNOSIS — Z720 Tobacco use: Secondary | ICD-10-CM | POA: Insufficient documentation

## 2015-03-30 DIAGNOSIS — R200 Anesthesia of skin: Secondary | ICD-10-CM | POA: Insufficient documentation

## 2015-03-30 DIAGNOSIS — R300 Dysuria: Secondary | ICD-10-CM | POA: Insufficient documentation

## 2015-03-30 DIAGNOSIS — Z8719 Personal history of other diseases of the digestive system: Secondary | ICD-10-CM | POA: Insufficient documentation

## 2015-03-30 DIAGNOSIS — Z87828 Personal history of other (healed) physical injury and trauma: Secondary | ICD-10-CM | POA: Insufficient documentation

## 2015-03-30 DIAGNOSIS — Z792 Long term (current) use of antibiotics: Secondary | ICD-10-CM | POA: Insufficient documentation

## 2015-03-30 DIAGNOSIS — Z8701 Personal history of pneumonia (recurrent): Secondary | ICD-10-CM | POA: Insufficient documentation

## 2015-03-30 DIAGNOSIS — Z7952 Long term (current) use of systemic steroids: Secondary | ICD-10-CM | POA: Insufficient documentation

## 2015-03-30 DIAGNOSIS — M5441 Lumbago with sciatica, right side: Secondary | ICD-10-CM | POA: Insufficient documentation

## 2015-03-30 DIAGNOSIS — F10129 Alcohol abuse with intoxication, unspecified: Secondary | ICD-10-CM | POA: Insufficient documentation

## 2015-03-30 DIAGNOSIS — J449 Chronic obstructive pulmonary disease, unspecified: Secondary | ICD-10-CM | POA: Insufficient documentation

## 2015-03-30 LAB — URINALYSIS, ROUTINE W REFLEX MICROSCOPIC
Bilirubin Urine: NEGATIVE
Glucose, UA: NEGATIVE mg/dL
Hgb urine dipstick: NEGATIVE
KETONES UR: NEGATIVE mg/dL
Leukocytes, UA: NEGATIVE
Nitrite: NEGATIVE
PROTEIN: NEGATIVE mg/dL
SPECIFIC GRAVITY, URINE: 1.005 (ref 1.005–1.030)
Urobilinogen, UA: 0.2 mg/dL (ref 0.0–1.0)
pH: 5 (ref 5.0–8.0)

## 2015-03-30 MED ORDER — IBUPROFEN 600 MG PO TABS: 600.0000 mg | ORAL_TABLET | Freq: Four times a day (QID) | ORAL | Status: DC | PRN

## 2015-03-30 MED ORDER — KETOROLAC TROMETHAMINE 60 MG/2ML IM SOLN
30.0000 mg | Freq: Once | INTRAMUSCULAR | Status: AC
  Administered 2015-03-30: 30 mg via INTRAMUSCULAR
  Filled 2015-03-30: qty 2
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
## 2015-03-30 NOTE — ED Notes (Signed)
Pt verbalized understanding of d/c instructions and has no further questions. Pt in NAD. Pt escorted to the lobby to wait for a ride or the bus.

## 2015-03-30 NOTE — ED Notes (Signed)
Per ems, pt complains of right lower back pain/hip pain radiating down right leg. Pt has hx of sciatica. Pt also admits to drinking "3 40's tonight" pt has etoh on board. Pt alert and oriented x 4, in room in gown and in NAD. Pt denies injury.

## 2015-03-30 NOTE — Discharge Instructions (Signed)
Return to the emergency room with worsening of symptoms, new symptoms or with symptoms that are concerning , especially fevers, loss of control of bladder or bowels, numbness or tingling around genital region or anus, weakness. RICE: Rest, Ice (three cycles of 20 mins on, off at least twice a day), compression/brace, elevation. Heating pad works well for back pain. Ibuprofen 400mg  (2 tablets 200mg ) every 5-6 hours for 3-5 days. Follow up with orthopedist. Call number above. Read below information and follow recommendations.  Back Exercises Back exercises help treat and prevent back injuries. The goal of back exercises is to increase the strength of your abdominal and back muscles and the flexibility of your back. These exercises should be started when you no longer have back pain. Back exercises include:  Pelvic Tilt. Lie on your back with your knees bent. Tilt your pelvis until the lower part of your back is against the floor. Hold this position 5 to 10 sec and repeat 5 to 10 times.  Knee to Chest. Pull first 1 knee up against your chest and hold for 20 to 30 seconds, repeat this with the other knee, and then both knees. This may be done with the other leg straight or bent, whichever feels better.  Sit-Ups or Curl-Ups. Bend your knees 90 degrees. Start with tilting your pelvis, and do a partial, slow sit-up, lifting your trunk only 30 to 45 degrees off the floor. Take at least 2 to 3 seconds for each sit-up. Do not do sit-ups with your knees out straight. If partial sit-ups are difficult, simply do the above but with only tightening your abdominal muscles and holding it as directed.  Hip-Lift. Lie on your back with your knees flexed 90 degrees. Push down with your feet and shoulders as you raise your hips a couple inches off the floor; hold for 10 seconds, repeat 5 to 10 times.  Back arches. Lie on your stomach, propping yourself up on bent elbows. Slowly press on your hands, causing an arch  in your low back. Repeat 3 to 5 times. Any initial stiffness and discomfort should lessen with repetition over time.  Shoulder-Lifts. Lie face down with arms beside your body. Keep hips and torso pressed to floor as you slowly lift your head and shoulders off the floor. Do not overdo your exercises, especially in the beginning. Exercises may cause you some mild back discomfort which lasts for a few minutes; however, if the pain is more severe, or lasts for more than 15 minutes, do not continue exercises until you see your caregiver. Improvement with exercise therapy for back problems is slow.  See your caregivers for assistance with developing a proper back exercise program. Document Released: 11/04/2004 Document Revised: 12/20/2011 Document Reviewed: 07/29/2011 [facility] Patient Information 2015 [place], [place]. This information is not intended to replace advice given to you by your health care provider. Make sure you discuss any questions you have with your health care provider.

## 2015-05-13 ENCOUNTER — Encounter (HOSPITAL_COMMUNITY): Payer: Self-pay | Admitting: Emergency Medicine

## 2015-05-13 ENCOUNTER — Emergency Department (HOSPITAL_COMMUNITY)
Admission: EM | Admit: 2015-05-13 | Discharge: 2015-05-13 | Disposition: A | Discharge status: Home / Self Care | Payer: Self-pay | Attending: Emergency Medicine | Admitting: Emergency Medicine

## 2015-05-13 DIAGNOSIS — Z8719 Personal history of other diseases of the digestive system: Secondary | ICD-10-CM | POA: Insufficient documentation

## 2015-05-13 DIAGNOSIS — M5441 Lumbago with sciatica, right side: Secondary | ICD-10-CM | POA: Insufficient documentation

## 2015-05-13 DIAGNOSIS — Z87828 Personal history of other (healed) physical injury and trauma: Secondary | ICD-10-CM | POA: Insufficient documentation

## 2015-05-13 DIAGNOSIS — M5431 Sciatica, right side: Secondary | ICD-10-CM

## 2015-05-13 DIAGNOSIS — Z8701 Personal history of pneumonia (recurrent): Secondary | ICD-10-CM | POA: Insufficient documentation

## 2015-05-13 DIAGNOSIS — Z720 Tobacco use: Secondary | ICD-10-CM | POA: Insufficient documentation

## 2015-05-13 DIAGNOSIS — J449 Chronic obstructive pulmonary disease, unspecified: Secondary | ICD-10-CM | POA: Insufficient documentation

## 2015-05-13 MED ORDER — CYCLOBENZAPRINE HCL 10 MG PO TABS: 10.0000 mg | ORAL_TABLET | Freq: Three times a day (TID) | ORAL | Status: DC | PRN

## 2015-05-13 MED ORDER — OXYCODONE-ACETAMINOPHEN 5-325 MG PO TABS: 1.0000 | ORAL_TABLET | ORAL | Status: DC | PRN

## 2015-05-13 NOTE — Discharge Instructions (Signed)
Take two naproxen (Aleve) tablets at a time, twice a day.  Sciatica Sciatica is pain, weakness, numbness, or tingling along the path of the sciatic nerve. The nerve starts in the lower back and runs down the back of each leg. The nerve controls the muscles in the lower leg and in the back of the knee, while also providing sensation to the back of the thigh, lower leg, and the sole of your foot. Sciatica is a symptom of another medical condition. For instance, nerve damage or certain conditions, such as a herniated disk or bone spur on the spine, pinch or put pressure on the sciatic nerve. This causes the pain, weakness, or other sensations normally associated with sciatica. Generally, sciatica only affects one side of the body. CAUSES   Herniated or slipped disc.  Degenerative disk disease.  A pain disorder involving the narrow muscle in the buttocks (piriformis syndrome).  Pelvic injury or fracture.  Pregnancy.  Tumor (rare). SYMPTOMS  Symptoms can vary from mild to very severe. The symptoms usually travel from the low back to the buttocks and down the back of the leg. Symptoms can include:  Mild tingling or dull aches in the lower back, leg, or hip.  Numbness in the back of the calf or sole of the foot.  Burning sensations in the lower back, leg, or hip.  Sharp pains in the lower back, leg, or hip.  Leg weakness.  Severe back pain inhibiting movement. These symptoms may get worse with coughing, sneezing, laughing, or prolonged sitting or standing. Also, being overweight may worsen symptoms. DIAGNOSIS  Your caregiver will perform a physical exam to look for common symptoms of sciatica. He or she may ask you to do certain movements or activities that would trigger sciatic nerve pain. Other tests may be performed to find the cause of the sciatica. These may include:  Blood tests.  X-rays.  Imaging tests, such as an MRI or CT scan. TREATMENT  Treatment is directed at the cause  of the sciatic pain. Sometimes, treatment is not necessary and the pain and discomfort goes away on its own. If treatment is needed, your caregiver may suggest:  Over-the-counter medicines to relieve pain.  Prescription medicines, such as anti-inflammatory medicine, muscle relaxants, or narcotics.  Applying heat or ice to the painful area.  Steroid injections to lessen pain, irritation, and inflammation around the nerve.  Reducing activity during periods of pain.  Exercising and stretching to strengthen your abdomen and improve flexibility of your spine. Your caregiver may suggest losing weight if the extra weight makes the back pain worse.  Physical therapy.  Surgery to eliminate what is pressing or pinching the nerve, such as a bone spur or part of a herniated disk. HOME CARE INSTRUCTIONS   Only take over-the-counter or prescription medicines for pain or discomfort as directed by your caregiver.  Apply ice to the affected area for 20 minutes, 3-4 times a day for the first 48-72 hours. Then try heat in the same way.  Exercise, stretch, or perform your usual activities if these do not aggravate your pain.  Attend physical therapy sessions as directed by your caregiver.  Keep all follow-up appointments as directed by your caregiver.  Do not wear high heels or shoes that do not provide proper support.  Check your mattress to see if it is too soft. A firm mattress may lessen your pain and discomfort. SEEK IMMEDIATE MEDICAL CARE IF:   You lose control of your bowel or bladder (  incontinence).  You have increasing weakness in the lower back, pelvis, buttocks, or legs.  You have redness or swelling of your back.  You have a burning sensation when you urinate.  You have pain that gets worse when you lie down or awakens you at night.  Your pain is worse than you have experienced in the past.  Your pain is lasting longer than 4 weeks.  You are suddenly losing weight without  reason. MAKE SURE YOU:  Understand these instructions.  Will watch your condition.  Will get help right away if you are not doing well or get worse. Document Released: 09/21/2001 Document Revised: 03/28/2012 Document Reviewed: 02/06/2012 [facility] Patient Information 2015 [place], [place]. This information is not intended to replace advice given to you by your health care provider. Make sure you discuss any questions you have with your health care provider.  Cyclobenzaprine tablets What is this medicine? CYCLOBENZAPRINE (sye kloe BEN za preen) is a muscle relaxer. It is used to treat muscle pain, spasms, and stiffness. This medicine may be used for other purposes; ask your health care provider or pharmacist if you have questions. COMMON BRAND NAME(S): Fexmid, Flexeril What should I tell my health care provider before I take this medicine? They need to know if you have any of these conditions: -heart disease, irregular heartbeat, or previous heart attack -liver disease -thyroid problem -an unusual or allergic reaction to cyclobenzaprine, tricyclic antidepressants, lactose, other medicines, foods, dyes, or preservatives -pregnant or trying to get pregnant -breast-feeding How should I use this medicine? Take this medicine by mouth with a glass of water. Follow the directions on the prescription label. If this medicine upsets your stomach, take it with food or milk. Take your medicine at regular intervals. Do not take it more often than directed. Talk to your pediatrician regarding the use of this medicine in children. Special care may be needed. Overdosage: If you think you have taken too much of this medicine contact a poison control center or emergency room at once. NOTE: This medicine is only for you. Do not share this medicine with others. What if I miss a dose? If you miss a dose, take it as soon as you can. If it is almost time for your next dose, take only that dose. Do not take  double or extra doses. What may interact with this medicine? Do not take this medicine with any of the following medications: -certain medicines for fungal infections like fluconazole, itraconazole, ketoconazole, posaconazole, voriconazole -cisapride -dofetilide -dronedarone -droperidol -flecainide -grepafloxacin -halofantrine -levomethadyl -MAOIs like Carbex, Eldepryl, Marplan, Nardil, and Parnate -nilotinib -pimozide -probucol -sertindole -thioridazine -ziprasidone This medicine may also interact with the following medications: -abarelix -alcohol -certain medicines for cancer -certain medicines for depression, anxiety, or psychotic disturbances -certain medicines for infection like alfuzosin, chloroquine, clarithromycin, levofloxacin, mefloquine, pentamidine, troleandomycin -certain medicines for an irregular heart beat -certain medicines used for sleep or numbness during surgery or procedure -contrast dyes -dolasetron -guanethidine -methadone -octreotide -ondansetron -other medicines that prolong the QT interval (cause an abnormal heart rhythm) -palonosetron -phenothiazines like chlorpromazine, mesoridazine, prochlorperazine, thioridazine -tramadol -vardenafil This list may not describe all possible interactions. Give your health care provider a list of all the medicines, herbs, non-prescription drugs, or dietary supplements you use. Also tell them if you smoke, drink alcohol, or use illegal drugs. Some items may interact with your medicine. What should I watch for while using this medicine? Check with your doctor or health care professional if your condition does not improve  within 1 to 3 weeks. You may get drowsy or dizzy when you first start taking the medicine or change doses. Do not drive, use machinery, or do anything that may be dangerous until you know how the medicine affects you. Stand or sit up slowly. Your mouth may get dry. Drinking water, chewing sugarless  gum, or sucking on hard candy may help. What side effects may I notice from receiving this medicine? Side effects that you should report to your doctor or health care professional as soon as possible: -allergic reactions like skin rash, itching or hives, swelling of the face, lips, or tongue -chest pain -fast heartbeat -hallucinations -seizures -vomiting Side effects that usually do not require medical attention (report to your doctor or health care professional if they continue or are bothersome): -headache This list may not describe all possible side effects. Call your doctor for medical advice about side effects. You may report side effects to FDA at 1-800-FDA-1088. Where should I keep my medicine? Keep out of the reach of children. Store at room temperature between 15 and 30 degrees C (59 and 86 degrees F). Keep container tightly closed. Throw away any unused medicine after the expiration date. NOTE: This sheet is a summary. It may not cover all possible information. If you have questions about this medicine, talk to your doctor, pharmacist, or health care provider.  2015, Elsevier/Gold Standard. (2013-04-24 12:48:19)  Acetaminophen; Oxycodone tablets What is this medicine? ACETAMINOPHEN; OXYCODONE (a set a MEE noe fen; ox i KOE done) is a pain reliever. It is used to treat mild to moderate pain. This medicine may be used for other purposes; ask your health care provider or pharmacist if you have questions. COMMON BRAND NAME(S): Endocet, Magnacet, Narvox, Percocet, Perloxx, Primalev, Primlev, Roxicet, Xolox What should I tell my health care provider before I take this medicine? They need to know if you have any of these conditions: -brain tumor -Crohn's disease, inflammatory bowel disease, or ulcerative colitis -drug abuse or addiction -head injury -heart or circulation problems -if you often drink alcohol -kidney disease or problems going to the bathroom -liver disease -lung  disease, asthma, or breathing problems -an unusual or allergic reaction to acetaminophen, oxycodone, other opioid analgesics, other medicines, foods, dyes, or preservatives -pregnant or trying to get pregnant -breast-feeding How should I use this medicine? Take this medicine by mouth with a full glass of water. Follow the directions on the prescription label. Take your medicine at regular intervals. Do not take your medicine more often than directed. Talk to your pediatrician regarding the use of this medicine in children. Special care may be needed. Patients over 60 years old may have a stronger reaction and need a smaller dose. Overdosage: If you think you have taken too much of this medicine contact a poison control center or emergency room at once. NOTE: This medicine is only for you. Do not share this medicine with others. What if I miss a dose? If you miss a dose, take it as soon as you can. If it is almost time for your next dose, take only that dose. Do not take double or extra doses. What may interact with this medicine? -alcohol -antihistamines -barbiturates like amobarbital, butalbital, butabarbital, methohexital, pentobarbital, phenobarbital, thiopental, and secobarbital -benztropine -drugs for bladder problems like solifenacin, trospium, oxybutynin, tolterodine, hyoscyamine, and methscopolamine -drugs for breathing problems like ipratropium and tiotropium -drugs for certain stomach or intestine problems like propantheline, homatropine methylbromide, glycopyrrolate, atropine, belladonna, and dicyclomine -general anesthetics like etomidate, ketamine,  nitrous oxide, propofol, desflurane, enflurane, halothane, isoflurane, and sevoflurane -medicines for depression, anxiety, or psychotic disturbances -medicines for sleep -muscle relaxants -naltrexone -narcotic medicines (opiates) for pain -phenothiazines like perphenazine, thioridazine, chlorpromazine, mesoridazine, fluphenazine,  prochlorperazine, promazine, and trifluoperazine -scopolamine -tramadol -trihexyphenidyl This list may not describe all possible interactions. Give your health care provider a list of all the medicines, herbs, non-prescription drugs, or dietary supplements you use. Also tell them if you smoke, drink alcohol, or use illegal drugs. Some items may interact with your medicine. What should I watch for while using this medicine? Tell your doctor or health care professional if your pain does not go away, if it gets worse, or if you have new or a different type of pain. You may develop tolerance to the medicine. Tolerance means that you will need a higher dose of the medication for pain relief. Tolerance is normal and is expected if you take this medicine for a long time. Do not suddenly stop taking your medicine because you may develop a severe reaction. Your body becomes used to the medicine. This does NOT mean you are addicted. Addiction is a behavior related to getting and using a drug for a non-medical reason. If you have pain, you have a medical reason to take pain medicine. Your doctor will tell you how much medicine to take. If your doctor wants you to stop the medicine, the dose will be slowly lowered over time to avoid any side effects. You may get drowsy or dizzy. Do not drive, use machinery, or do anything that needs mental alertness until you know how this medicine affects you. Do not stand or sit up quickly, especially if you are an older patient. This reduces the risk of dizzy or fainting spells. Alcohol may interfere with the effect of this medicine. Avoid alcoholic drinks. There are different types of narcotic medicines (opiates) for pain. If you take more than one type at the same time, you may have more side effects. Give your health care provider a list of all medicines you use. Your doctor will tell you how much medicine to take. Do not take more medicine than directed. Call emergency for help  if you have problems breathing. The medicine will cause constipation. Try to have a bowel movement at least every 2 to 3 days. If you do not have a bowel movement for 3 days, call your doctor or health care professional. Do not take Tylenol (acetaminophen) or medicines that have acetaminophen with this medicine. Too much acetaminophen can be very dangerous. Many nonprescription medicines contain acetaminophen. Always read the labels carefully to avoid taking more acetaminophen. What side effects may I notice from receiving this medicine? Side effects that you should report to your doctor or health care professional as soon as possible: -allergic reactions like skin rash, itching or hives, swelling of the face, lips, or tongue -breathing difficulties, wheezing -confusion -light headedness or fainting spells -severe stomach pain -unusually weak or tired -yellowing of the skin or the whites of the eyes Side effects that usually do not require medical attention (report to your doctor or health care professional if they continue or are bothersome): -dizziness -drowsiness -nausea -vomiting This list may not describe all possible side effects. Call your doctor for medical advice about side effects. You may report side effects to FDA at 1-800-FDA-1088. Where should I keep my medicine? Keep out of the reach of children. This medicine can be abused. Keep your medicine in a safe place to protect it from  theft. Do not share this medicine with anyone. Selling or giving away this medicine is dangerous and against the law. Store at room temperature between 20 and 25 degrees C (68 and 77 degrees F). Keep container tightly closed. Protect from light. This medicine may cause accidental overdose and death if it is taken by other adults, children, or pets. Flush any unused medicine down the toilet to reduce the chance of harm. Do not use the medicine after the expiration date. NOTE: This sheet is a summary. It may  not cover all possible information. If you have questions about this medicine, talk to your doctor, pharmacist, or health care provider.  2015, Elsevier/Gold Standard. (2013-05-21 13:17:35)   Emergency Department Resource Guide 1) Find a Doctor and Pay Out of Pocket Although you won't have to find out who is covered by your insurance plan, it is a good idea to ask around and get recommendations. You will then need to call the office and see if the doctor you have chosen will accept you as a new patient and what types of options they offer for patients who are self-pay. Some doctors offer discounts or will set up payment plans for their patients who do not have insurance, but you will need to ask so you aren't surprised when you get to your appointment.  2) Contact Your Local Health Department Not all health departments have doctors that can see patients for sick visits, but many do, so it is worth a call to see if yours does. If you don't know where your local health department is, you can check in your phone book. The CDC also has a tool to help you locate your state's health department, and many state websites also have listings of all of their local health departments.  3) Find a Walk-in Clinic If your illness is not likely to be very severe or complicated, you may want to try a walk in clinic. These are popping up all over the country in pharmacies, drugstores, and shopping centers. They're usually staffed by nurse practitioners or physician assistants that have been trained to treat common illnesses and complaints. They're usually fairly quick and inexpensive. However, if you have serious medical issues or chronic medical problems, these are probably not your best option.  No Primary Care Doctor: - Call Health Connect at  [phone] - they can help you locate a primary care doctor that  accepts your insurance, provides certain services, etc. - Physician Referral Service- [phone]  Chronic  Pain Problems: Organization         Address  Phone   Notes  [facility]  [phone] Patients need to be referred by their primary care doctor.   Medication Assistance: Organization         Address  Phone   Notes  [facility] Medication [facility] [address] [place] [place], [place] [address] [phone] --Must be a resident of [place] -- Must have NO insurance coverage whatsoever (no Medicaid/ Medicare, etc.) -- The pt. MUST have a primary care doctor that directs their care regularly and follows them in the community   [facility]  [phone]   [employer]  [phone]    Agencies that provide inexpensive medical care: Organization         Address  Phone   Notes  [facility]  [phone]   [facility]    [phone]   [facility] [address] [place], [place] [address] [phone]   [facility] [address], [place] [phone]   [facility]anned [facility]    [phone]   [facility]    [phone]   [facility]  [address], [place] Phone:  [phone], Fax:  [phone] Hours of Operation:  9 am - 6 pm, M-F.  Also accepts Medicaid/Medicare and self-pay.  [facility]  [address], [place] Phone: [phone], Fax: [phone]. Hours of Operation:  8:30 am - 5:30 pm, M-F.  Also accepts Medicaid and self-pay.  [facility] [place] [address] [place] Phone: [phone]   [facility] [address] Natasha Bence [place], [place] [phone], Ext. [phone] Mondays & Thursdays: 7-9 AM.  First 15 patients are seen on a first come, first serve basis.    Medicaid-accepting [facility] Providers:  Organization         Address  Phone   Notes  [facility] [address], [place] [phone] Also  accepts self-pay patients.  [facility] [address] Laurell Josephs [place], [place]  [phone]   [facility] [address], [place] [phone]   [facility] [address], [place] [phone]   Renaye Rakers [address], [place]   [phone] Only accepts [place] patients after they have their name applied to their card.   Self-Pay (no insurance) in [facility]:  Organization         Address  Phone   Notes  Sickle Cell Patients, [facility] [address] [place], [place] [phone]   [facility] [address] [place], [place] [phone]   [facility] [place]  [address],  [phone]   [facility]/Dr. Osei-Bonsu  [address] [place] or [address], [place] [phone] Phone number for both [place] and [place] locations is the same.  [facility] and [facility] [address], [place] [phone]   [facility] [address] [place] or [address] [phone]   [facility] [address] [place] [phone], phone; [phone], fax Sees patients 1st and 3rd Saturday of every month.  Must not qualify for public or private insurance (i.e. Medicaid, Medicare, [place] Health Choice, Veterans' Benefits)  Household income should be no more than 200% of the poverty level The clinic cannot treat you if you are pregnant or think you are pregnant  Sexually transmitted diseases are not treated at the clinic.    Dental Care: Organization         Address  Phone  Notes  [facility] Department of [facility] [address] [place], [place] [phone] Accepts children up to age 55 who are enrolled in [place] or [place] Health Choice; pregnant  women with a Medicaid card; and children who have applied for Medicaid or [place] Health Choice, but were declined, whose parents can pay a reduced fee at time of service.  [facility] Department of [facility]  [address], [place] [phone] Accepts children up to age 74 who are enrolled in  Medicaid or [place] Health Choice; pregnant women with a Medicaid card; and children who have applied for Medicaid or  Health Choice, but were declined, whose parents can pay a reduced fee at time of service.  [facility]  [address] [place], [place] [phone] Patients are seen by appointment only. Walk-ins are not accepted. [facility] will see patients 87 years of age and older. Monday - Tuesday (8am-5pm) Most Wednesdays (8:30-5pm) $30 per visit, cash only  [facility]  [address], [facility] [phone] Patients are seen by appointment only. Walk-ins are not accepted. [facility] will see patients 43 years of age and older. One Wednesday Evening (Monthly: Volunteer Based).  $30 per visit, cash only  [employer]  [phone] for adults; Children under age 50, call Graduate Pediatric Dentistry at [phone]. Children aged 35-14, please call [phone] to request a pediatric application.  Dental services are provided in all areas of dental care including fillings, crowns and bridges, complete and partial dentures, implants, gum treatment, root canals, and extractions. Preventive care is also provided. Treatment is provided to both adults and children. Patients are selected via a lottery and there is often a waiting list.   [facility] [address] [place]  [phone] [email or web address]   [facility] [address] [place], [place] [phone], Ext. [phone] Second and Fourth Thursday of each month, opens at 6:30 AM; Clinic ends at 9 AM.  Patients are  seen on a first-come first-served basis, and a limited number are seen during each clinic.   [facility]  [address] [place], [place] [phone]   Eligibility Requirements You must have lived in [place], [place], or [place] counties for at least the last three months.   You cannot be eligible for state or federal sponsored [place], including [employer], [place], or [employer].   You generally cannot be eligible for healthcare insurance through your employer.    How to apply: Eligibility screenings are held every Tuesday and Wednesday afternoon from 1:00 pm until 4:00 pm. You do not need an appointment for the interview!  [facility] [address], [place], [place] [phone]   [facility] Health Department  [phone]   [facility] Health Department  [phone]   [facility] Health Department  [phone]    Behavioral Health Resources in the Community: Intensive Outpatient Programs Organization         Address  Phone  Notes  [facility] [address], [place], [place] [phone]   [facility] Outpatient [address] [place], [place] [phone]   [facility] [address], [place], [place]  [phone]   [facility] [address]  [place], [place] [phone] or [phone]   Substance Abuse Resources Organization         Address  Phone  Notes  [facility]  [phone]   [facility]  [phone]   [facility]  [phone]   Floydene Flock  [phone]   Residential & Outpatient Substance Abuse Program  [phone]   Psychological Services Organization         Address  Phone  Notes  [facility]  [phone]   [facility]  [phone]   [facility] [address], [place] [phone] or [phone]  Mobile Crisis  Teams Organization         Address  Phone  Notes  [facility], Mobile Crisis Care Unit  [phone]   [facility]  [address] [place], [place]   [place] [facility] [address] [place] [phone]    Self-Help/Support Groups Organization         Address  Phone             Notes  [place] [place] - variety of support groups  [place] Call for more information  Narcotics Anonymous (NA), [facility] [address], [employer] [place]  2 meetings at this location   Special educational needs teacher         Address  Phone  Notes  [facility] [place],    [place]  [phone]   [facility]  [address] [place] [address], [place], [place]   [place], [place] [phone] Admissions: 8am-3pm M-F  [facility]cent[facility] Substance [place] [address],    [place], [place] [phone]   [facility] [address] [place], [place], [place]   [facility] [address]  [place], [place]   [facility] - Intensive Outpatient [address], [place], [place]   [facility] [address]  [place], [place] [phone] or [phone]   [facility] ([facility] [address] [place], [place] Accepts Medicaid  [facility] [address]  [place] [phone] Substance Abuse/Addiction Treatment   [facility] Organization         Address  Phone  Notes  [facility]  [phone]   Domenic Schwab, PhD [address] [place], [place]   [phone] or [phone]   [facility], [place] [phone]   [facility] [address] [place], [place] [phone]  Insurance/Medicaid/sponsorship through [facility] [address] [place]                                    [place], [place] [phone] [place] [address] [place], [place] [phone]    Dr. Adele Schilder  [phone]   [facility] Dept. 1) [address], [place] 2) [place] 3)  [place] [address], [place] [phone]  [phone]   [place] [phone] or [phone] (After Hours)
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
## 2015-05-13 NOTE — ED Notes (Signed)
The patient said her leg "gave out" on her and she fell on to the bed. She denies any injury but says her leg is hurting her.  She rates her pain 9/10.  The patient said she usually takes advil or aleve and it works but it is not working.

## 2015-05-13 NOTE — ED Notes (Signed)
Patient refused wheelchair states" I have to start walking."

## 2016-05-10 ENCOUNTER — Emergency Department (HOSPITAL_COMMUNITY)
Admission: EM | Admit: 2016-05-10 | Discharge: 2016-05-10 | Disposition: A | Discharge status: Home / Self Care | Payer: Self-pay | Attending: Emergency Medicine | Admitting: Emergency Medicine

## 2016-05-10 ENCOUNTER — Encounter (HOSPITAL_COMMUNITY): Payer: Self-pay | Admitting: Emergency Medicine

## 2016-05-10 DIAGNOSIS — F17210 Nicotine dependence, cigarettes, uncomplicated: Secondary | ICD-10-CM | POA: Insufficient documentation

## 2016-05-10 DIAGNOSIS — M25562 Pain in left knee: Secondary | ICD-10-CM | POA: Insufficient documentation

## 2016-05-10 DIAGNOSIS — M79605 Pain in left leg: Secondary | ICD-10-CM | POA: Insufficient documentation

## 2016-05-10 DIAGNOSIS — M79604 Pain in right leg: Secondary | ICD-10-CM | POA: Insufficient documentation

## 2016-05-10 DIAGNOSIS — J449 Chronic obstructive pulmonary disease, unspecified: Secondary | ICD-10-CM | POA: Insufficient documentation

## 2016-05-10 MED ORDER — KETOROLAC TROMETHAMINE 60 MG/2ML IM SOLN
60.0000 mg | Freq: Once | INTRAMUSCULAR | Status: AC
  Administered 2016-05-10: 60 mg via INTRAMUSCULAR
  Filled 2016-05-10: qty 2

## 2016-05-10 MED ORDER — DEXAMETHASONE SODIUM PHOSPHATE 10 MG/ML IJ SOLN
10.0000 mg | Freq: Once | INTRAMUSCULAR | Status: AC
  Administered 2016-05-10: 10 mg via INTRAMUSCULAR
  Filled 2016-05-10: qty 1

## 2016-05-10 MED ORDER — CYCLOBENZAPRINE HCL 10 MG PO TABS
10.0000 mg | ORAL_TABLET | Freq: Once | ORAL | Status: DC
  Filled 2016-05-10: qty 1

## 2016-05-10 MED ORDER — TRAMADOL HCL 50 MG PO TABS: 50.0000 mg | ORAL_TABLET | Freq: Four times a day (QID) | ORAL | 0 refills | Status: DC | PRN

## 2016-05-10 MED ORDER — CYCLOBENZAPRINE HCL 10 MG PO TABS: 10.0000 mg | ORAL_TABLET | Freq: Two times a day (BID) | ORAL | 0 refills | Status: DC | PRN

## 2016-05-10 NOTE — ED Triage Notes (Signed)
Pt reports lower back pain, bilateral leg pain. Pt reports she has arthritis. Pt reports this has been going on for six months and has gotten worse the past two months without relief from tylenol or aleve. Pt reports left knee is especially giving her problems when walking. Denies injury. Pt reports last pain medication was last night.
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
## 2017-05-23 ENCOUNTER — Other Ambulatory Visit: Payer: Self-pay | Admitting: *Deleted

## 2017-05-23 DIAGNOSIS — Z1231 Encounter for screening mammogram for malignant neoplasm of breast: Secondary | ICD-10-CM

## 2017-08-24 ENCOUNTER — Emergency Department (HOSPITAL_COMMUNITY): Payer: Self-pay

## 2017-08-24 ENCOUNTER — Other Ambulatory Visit: Payer: Self-pay

## 2017-08-24 ENCOUNTER — Encounter (HOSPITAL_COMMUNITY): Payer: Self-pay | Admitting: Emergency Medicine

## 2017-08-24 ENCOUNTER — Emergency Department (HOSPITAL_COMMUNITY)
Admission: EM | Admit: 2017-08-24 | Discharge: 2017-08-24 | Disposition: A | Discharge status: Home / Self Care | Payer: Self-pay | Attending: Emergency Medicine | Admitting: Emergency Medicine

## 2017-08-24 DIAGNOSIS — R0789 Other chest pain: Secondary | ICD-10-CM | POA: Insufficient documentation

## 2017-08-24 DIAGNOSIS — F17210 Nicotine dependence, cigarettes, uncomplicated: Secondary | ICD-10-CM | POA: Insufficient documentation

## 2017-08-24 DIAGNOSIS — J449 Chronic obstructive pulmonary disease, unspecified: Secondary | ICD-10-CM | POA: Insufficient documentation

## 2017-08-24 DIAGNOSIS — F1910 Other psychoactive substance abuse, uncomplicated: Secondary | ICD-10-CM | POA: Insufficient documentation

## 2017-08-24 LAB — BASIC METABOLIC PANEL
Anion gap: 10 (ref 5–15)
BUN: 7 mg/dL (ref 6–20)
CHLORIDE: 109 mmol/L (ref 101–111)
CO2: 21 mmol/L — ABNORMAL LOW (ref 22–32)
Calcium: 9 mg/dL (ref 8.9–10.3)
Creatinine, Ser: 0.59 mg/dL (ref 0.44–1.00)
GFR calc Af Amer: >60 mL/min (ref >60)
GFR calc non Af Amer: >60 mL/min (ref >60)
Glucose, Bld: 99 mg/dL (ref 65–99)
Potassium: 3.6 mmol/L (ref 3.5–5.1)
Sodium: 140 mmol/L (ref 135–145)

## 2017-08-24 LAB — CBC
HCT: 42.2 % (ref 36.0–46.0)
Hemoglobin: 14.4 g/dL (ref 12.0–15.0)
MCH: 33.6 pg (ref 26.0–34.0)
MCHC: 34.1 g/dL (ref 30.0–36.0)
MCV: 98.4 fL (ref 78.0–100.0)
PLATELETS: 304 10*3/uL (ref 150–400)
RBC: 4.29 MIL/uL (ref 3.87–5.11)
RDW: 12.8 % (ref 11.5–15.5)
WBC: 6.5 10*3/uL (ref 4.0–10.5)

## 2017-08-24 LAB — I-STAT TROPONIN, ED
Troponin i, poc: 0 ng/mL (ref 0.00–0.08)
Troponin i, poc: 0.01 ng/mL (ref 0.00–0.08)

## 2017-08-24 LAB — RAPID URINE DRUG SCREEN, HOSP PERFORMED
AMPHETAMINES: NOT DETECTED
Barbiturates: NOT DETECTED
Benzodiazepines: NOT DETECTED
Cocaine: POSITIVE — AB
Opiates: NOT DETECTED
Tetrahydrocannabinol: NOT DETECTED

## 2017-08-24 LAB — ETHANOL: Alcohol, Ethyl (B): 184 mg/dL — ABNORMAL HIGH (ref <10)

## 2017-08-24 MED ORDER — SODIUM CHLORIDE 0.9 % IV BOLUS (SEPSIS): 1000.0000 mL | Freq: Once | INTRAVENOUS | Status: DC

## 2017-08-24 MED ORDER — KETOROLAC TROMETHAMINE 30 MG/ML IJ SOLN
30.0000 mg | Freq: Once | INTRAMUSCULAR | Status: AC
  Administered 2017-08-24: 30 mg via INTRAVENOUS
  Filled 2017-08-24: qty 1

## 2017-08-24 NOTE — Discharge Instructions (Signed)
You may alternate Tylenol 1000 mg every 6 hours as needed for pain and Ibuprofen 800 mg every 8 hours as needed for pain.  Please take Ibuprofen with food. ° °

## 2017-08-24 NOTE — ED Triage Notes (Signed)
[facility] that pt described as cramps pt states she had 12 pack bear and use crack today before cp started. SR on monitor  BP 116/82 HR 70 R 19 SPO2 98 % RA
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
## 2017-08-26 ENCOUNTER — Other Ambulatory Visit: Payer: Self-pay

## 2017-08-26 ENCOUNTER — Encounter (HOSPITAL_COMMUNITY): Payer: Self-pay

## 2017-08-26 DIAGNOSIS — J449 Chronic obstructive pulmonary disease, unspecified: Secondary | ICD-10-CM | POA: Insufficient documentation

## 2017-08-26 DIAGNOSIS — F17210 Nicotine dependence, cigarettes, uncomplicated: Secondary | ICD-10-CM | POA: Insufficient documentation

## 2017-08-26 DIAGNOSIS — M5431 Sciatica, right side: Secondary | ICD-10-CM | POA: Insufficient documentation

## 2017-08-26 NOTE — ED Triage Notes (Addendum)
Pt complaining of R sided leg pain that radiates down her leg. She also states that she is still "catching cramps" in her abdomen which she said [facility]. Denies N/V/D. A&Ox4. No facial droop or unilateral weakness. Denies injury or fall. Ambulatory.

## 2017-08-27 ENCOUNTER — Emergency Department (HOSPITAL_COMMUNITY)
Admission: EM | Admit: 2017-08-27 | Discharge: 2017-08-27 | Disposition: A | Discharge status: Home / Self Care | Payer: Self-pay | Attending: Emergency Medicine | Admitting: Emergency Medicine

## 2017-08-27 DIAGNOSIS — M5431 Sciatica, right side: Secondary | ICD-10-CM

## 2017-08-27 MED ORDER — DEXAMETHASONE SODIUM PHOSPHATE 10 MG/ML IJ SOLN
10.0000 mg | Freq: Once | INTRAMUSCULAR | Status: AC
  Administered 2017-08-27: 10 mg via INTRAMUSCULAR
  Filled 2017-08-27: qty 1
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
## 2017-12-21 ENCOUNTER — Emergency Department (HOSPITAL_COMMUNITY): Payer: Self-pay

## 2017-12-21 ENCOUNTER — Encounter (HOSPITAL_COMMUNITY): Payer: Self-pay | Admitting: Emergency Medicine

## 2017-12-21 ENCOUNTER — Emergency Department (HOSPITAL_COMMUNITY)
Admission: EM | Admit: 2017-12-21 | Discharge: 2017-12-21 | Disposition: A | Discharge status: Home / Self Care | Payer: Self-pay | Attending: Emergency Medicine | Admitting: Emergency Medicine

## 2017-12-21 DIAGNOSIS — S0990XA Unspecified injury of head, initial encounter: Secondary | ICD-10-CM | POA: Insufficient documentation

## 2017-12-21 DIAGNOSIS — J449 Chronic obstructive pulmonary disease, unspecified: Secondary | ICD-10-CM | POA: Insufficient documentation

## 2017-12-21 DIAGNOSIS — F17210 Nicotine dependence, cigarettes, uncomplicated: Secondary | ICD-10-CM | POA: Insufficient documentation

## 2017-12-21 DIAGNOSIS — S7001XA Contusion of right hip, initial encounter: Secondary | ICD-10-CM | POA: Insufficient documentation

## 2017-12-21 DIAGNOSIS — Y998 Other external cause status: Secondary | ICD-10-CM | POA: Insufficient documentation

## 2017-12-21 DIAGNOSIS — Y9389 Activity, other specified: Secondary | ICD-10-CM | POA: Insufficient documentation

## 2017-12-21 DIAGNOSIS — Y929 Unspecified place or not applicable: Secondary | ICD-10-CM | POA: Insufficient documentation

## 2017-12-21 DIAGNOSIS — F1410 Cocaine abuse, uncomplicated: Secondary | ICD-10-CM | POA: Insufficient documentation

## 2017-12-21 HISTORY — DX: Unspecified osteoarthritis, unspecified site: M19.90

## 2017-12-21 MED ORDER — DICLOFENAC SODIUM 75 MG PO TBEC: 75.0000 mg | DELAYED_RELEASE_TABLET | Freq: Two times a day (BID) | ORAL | 0 refills | Status: DC

## 2017-12-21 NOTE — ED Notes (Signed)
Patient has been given her discharge instructions and waiting social worker consult
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
## 2017-12-21 NOTE — Discharge Instructions (Signed)
Return if any problems.
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
## 2017-12-21 NOTE — ED Notes (Signed)
States she is experiencing some cramps in her chest.

## 2017-12-22 ENCOUNTER — Encounter (HOSPITAL_COMMUNITY): Payer: Self-pay | Admitting: Emergency Medicine

## 2017-12-22 ENCOUNTER — Emergency Department (HOSPITAL_COMMUNITY)
Admission: EM | Admit: 2017-12-22 | Discharge: 2017-12-22 | Disposition: A | Discharge status: Home / Self Care | Payer: Medicaid Other | Attending: Emergency Medicine | Admitting: Emergency Medicine

## 2017-12-22 ENCOUNTER — Other Ambulatory Visit: Payer: Self-pay

## 2017-12-22 DIAGNOSIS — Z79899 Other long term (current) drug therapy: Secondary | ICD-10-CM | POA: Insufficient documentation

## 2017-12-22 DIAGNOSIS — R440 Auditory hallucinations: Secondary | ICD-10-CM | POA: Insufficient documentation

## 2017-12-22 DIAGNOSIS — J449 Chronic obstructive pulmonary disease, unspecified: Secondary | ICD-10-CM | POA: Insufficient documentation

## 2017-12-22 DIAGNOSIS — F1910 Other psychoactive substance abuse, uncomplicated: Secondary | ICD-10-CM

## 2017-12-22 DIAGNOSIS — F17210 Nicotine dependence, cigarettes, uncomplicated: Secondary | ICD-10-CM | POA: Insufficient documentation

## 2017-12-22 DIAGNOSIS — Y908 Blood alcohol level of 240 mg/100 ml or more: Secondary | ICD-10-CM | POA: Insufficient documentation

## 2017-12-22 DIAGNOSIS — F1994 Other psychoactive substance use, unspecified with psychoactive substance-induced mood disorder: Secondary | ICD-10-CM | POA: Diagnosis present

## 2017-12-22 DIAGNOSIS — R45851 Suicidal ideations: Secondary | ICD-10-CM | POA: Insufficient documentation

## 2017-12-22 LAB — CBC WITH DIFFERENTIAL/PLATELET
BASOS PCT: 0 %
Basophils Absolute: 0 10*3/uL (ref 0.0–0.1)
Eosinophils Absolute: 0.1 10*3/uL (ref 0.0–0.7)
Eosinophils Relative: 1 %
HEMATOCRIT: 42.3 % (ref 36.0–46.0)
HEMOGLOBIN: 14.4 g/dL (ref 12.0–15.0)
LYMPHS ABS: 4.7 10*3/uL — AB (ref 0.7–4.0)
LYMPHS PCT: 50 %
MCH: 33.2 pg (ref 26.0–34.0)
MCHC: 34 g/dL (ref 30.0–36.0)
MCV: 97.5 fL (ref 78.0–100.0)
MONOS PCT: 6 %
Monocytes Absolute: 0.6 10*3/uL (ref 0.1–1.0)
NEUTROS ABS: 4 10*3/uL (ref 1.7–7.7)
NEUTROS PCT: 43 %
Platelets: 342 10*3/uL (ref 150–400)
RBC: 4.34 MIL/uL (ref 3.87–5.11)
RDW: 12.9 % (ref 11.5–15.5)
WBC: 9.4 10*3/uL (ref 4.0–10.5)

## 2017-12-22 LAB — COMPREHENSIVE METABOLIC PANEL
ALK PHOS: 73 U/L (ref 38–126)
ALT: 20 U/L (ref 14–54)
AST: 25 U/L (ref 15–41)
Albumin: 4.2 g/dL (ref 3.5–5.0)
Anion gap: 15 (ref 5–15)
BUN: 12 mg/dL (ref 6–20)
CALCIUM: 8.9 mg/dL (ref 8.9–10.3)
CO2: 20 mmol/L — ABNORMAL LOW (ref 22–32)
CREATININE: 0.58 mg/dL (ref 0.44–1.00)
Chloride: 98 mmol/L — ABNORMAL LOW (ref 101–111)
Glucose, Bld: 105 mg/dL — ABNORMAL HIGH (ref 65–99)
Potassium: 3.7 mmol/L (ref 3.5–5.1)
Sodium: 133 mmol/L — ABNORMAL LOW (ref 135–145)
TOTAL PROTEIN: 8.1 g/dL (ref 6.5–8.1)
Total Bilirubin: 0.8 mg/dL (ref 0.3–1.2)

## 2017-12-22 LAB — RAPID URINE DRUG SCREEN, HOSP PERFORMED
Amphetamines: NOT DETECTED
Barbiturates: NOT DETECTED
Benzodiazepines: NOT DETECTED
Cocaine: POSITIVE — AB
OPIATES: NOT DETECTED
Tetrahydrocannabinol: NOT DETECTED

## 2017-12-22 LAB — I-STAT TROPONIN, ED: Troponin i, poc: 0 ng/mL (ref 0.00–0.08)

## 2017-12-22 LAB — I-STAT BETA HCG BLOOD, ED (MC, WL, AP ONLY): I-stat hCG, quantitative: <5 m[IU]/mL (ref <5)

## 2017-12-22 LAB — ETHANOL: ALCOHOL ETHYL (B): 253 mg/dL — AB (ref <10)

## 2017-12-22 MED ORDER — LORAZEPAM 1 MG PO TABS: 0.0000 mg | ORAL_TABLET | Freq: Two times a day (BID) | ORAL | Status: DC

## 2017-12-22 MED ORDER — LORAZEPAM 2 MG/ML IJ SOLN: 0.0000 mg | Freq: Four times a day (QID) | INTRAMUSCULAR | Status: DC

## 2017-12-22 MED ORDER — LORAZEPAM 2 MG/ML IJ SOLN: 0.0000 mg | Freq: Two times a day (BID) | INTRAMUSCULAR | Status: DC

## 2017-12-22 MED ORDER — SERTRALINE HCL 50 MG PO TABS
25.0000 mg | ORAL_TABLET | Freq: Every day | ORAL | Status: DC
  Administered 2017-12-22: 25 mg via ORAL
  Filled 2017-12-22: qty 1

## 2017-12-22 MED ORDER — VITAMIN B-1 100 MG PO TABS
100.0000 mg | ORAL_TABLET | Freq: Every day | ORAL | Status: DC
  Administered 2017-12-22: 100 mg via ORAL
  Filled 2017-12-22: qty 1

## 2017-12-22 MED ORDER — ACETAMINOPHEN 325 MG PO TABS: 650.0000 mg | ORAL_TABLET | ORAL | Status: DC | PRN

## 2017-12-22 MED ORDER — LORAZEPAM 1 MG PO TABS
0.0000 mg | ORAL_TABLET | Freq: Four times a day (QID) | ORAL | Status: DC
  Administered 2017-12-22: 1 mg via ORAL
  Filled 2017-12-22: qty 1

## 2017-12-22 MED ORDER — THIAMINE HCL 100 MG/ML IJ SOLN: 100.0000 mg | Freq: Every day | INTRAMUSCULAR | Status: DC

## 2017-12-22 MED ORDER — GABAPENTIN 300 MG PO CAPS
300.0000 mg | ORAL_CAPSULE | Freq: Three times a day (TID) | ORAL | Status: DC
  Administered 2017-12-22: 300 mg via ORAL
  Filled 2017-12-22: qty 1

## 2017-12-22 MED ORDER — ALUM & MAG HYDROXIDE-SIMETH 200-200-20 MG/5ML PO SUSP: 30.0000 mL | Freq: Four times a day (QID) | ORAL | Status: DC | PRN

## 2017-12-22 MED ORDER — ONDANSETRON HCL 4 MG PO TABS: 4.0000 mg | ORAL_TABLET | Freq: Three times a day (TID) | ORAL | Status: DC | PRN

## 2017-12-22 NOTE — BH Assessment (Signed)
[facility] Assessment Progress Note   Clinician attempted to get patient to wake up but she would not respond.  Nurse said that patient would only wake up to go to bathroom.  TTS to see patient when she is alert and oriented.
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
## 2017-12-22 NOTE — ED Notes (Signed)
Bed: WA26 Expected date:  Expected time:  Means of arrival:  Comments: Room 9 

## 2017-12-22 NOTE — ED Notes (Signed)
Pt gave urine sample but contaminated sample with stool.

## 2017-12-22 NOTE — ED Notes (Signed)
Pt walked to bathroom but acted as if she was falling; Clinical research associatewriter sat patient in chair and got patient back to room.

## 2017-12-22 NOTE — ED Notes (Signed)
Bed: WA09 Expected date:  Expected time:  Means of arrival:  Comments: 8053 f SI/ETOH

## 2017-12-22 NOTE — [facility] Suicide Risk Assessment (Signed)
Suicide Risk Assessment  Discharge Assessment   [facility]   Principal Problem: Substance induced mood disorder [facility](HCC) Discharge Diagnoses:  Patient Active Problem List   Diagnosis Date Noted  . Alcohol dependence (HCC) [F10.20] 07/23/2013  . Alcohol withdrawal (HCC) [F10.239] 07/23/2013  . Cocaine abuse with cocaine-induced mood disorder (HCC) [F14.14] 07/23/2013  . Substance induced mood disorder (HCC) [F19.94] 07/23/2013   Pt was seen and chart reviewed with treatment team and Dr Sharma CovertNorman. Pt denies suicidal/homicidal ideation, denies auditory/visual hallucinations and does not appear to be responding to internal stimuli. Pt is homeless and has alcohol and substance abuse problems. Pt stated she was in rehab several years ago for help with her alcoholism and would like to go back to rehab. Pt will be seen by Peer Support for assistance with substance abuse treatment options in the community. Pt is psychiatrically clear for discharge.   Total Time spent with patient: 30 minutes  Musculoskeletal: Strength & Muscle Tone: within normal limits Gait & Station: normal Patient leans: N/A  Psychiatric Specialty Exam:   Blood pressure 117/78, pulse 72, temperature 97.6 F (36.4 C), temperature source Oral, resp. rate 19, last menstrual period 06/24/2012, SpO2 94 %.There is no height or weight on file to calculate BMI.  General Appearance: Casual  Eye Contact::  Good  Speech:  Clear and Coherent409  Volume:  Normal  Mood:  Depressed  Affect:  Congruent and Depressed  Thought Process:  Coherent, Goal Directed and Linear  Orientation:  Full (Time, Place, and Person)  Thought Content:  Logical  Suicidal Thoughts:  No  Homicidal Thoughts:  No  Memory:  Immediate;   Good Recent;   Good Remote;   Fair  Judgement:  Fair  Insight:  Fair  Psychomotor Activity:  Normal  Concentration:  Good  Recall:  Good  Fund of Knowledge:Good  Language: Good  Akathisia:  No   Handed:  Right  AIMS (if indicated):     Assets:  Communication Skills Desire for Improvement Social Support  Sleep:     Cognition: WNL  ADL's:  Intact   Mental Status Per Nursing Assessment::   On Admission:   Suicidal while intoxicated and high on drugs  Demographic Factors:  Low socioeconomic status and Unemployed  Loss Factors: Legal issues and Financial problems/change in socioeconomic status  Historical Factors: Impulsivity  Risk Reduction Factors:   Sense of responsibility to family  Continued Clinical Symptoms:  Depression:   Impulsivity Alcohol/Substance Abuse/Dependencies  Cognitive Features That Contribute To Risk:  Closed-mindedness    Suicide Risk:  Minimal: No identifiable suicidal ideation.  Patients presenting with no risk factors but with morbid ruminations; may be classified as minimal risk based on the severity of the depressive symptoms   Plan Of Care/Follow-up recommendations:  Activity:  as tolerated Diet:  Heart Healthy  Laveda AbbeLaurie Britton Parks, NP 12/22/2017, 11:45 AM

## 2017-12-22 NOTE — BH Assessment (Signed)
[facility]  Per Juanetta BeetsJacqueline Norman, DO, this pt does not require psychiatric hospitalization at this time.  [facility]  This has been included in pt's discharge instructions.  Pt would also benefit from seeing Peer Support Specialists; they will be asked to speak to pt.  Pt's nurse, Donnal DebarRandi, has been notified.  Doylene Canninghomas Norma Montemurro, MA Triage Specialist [phone]

## 2017-12-22 NOTE — Discharge Instructions (Signed)
For your behavioral health needs you are advised to follow up with [facility].  New patients are seen at their walk-in clinic.  Walk-in hours are Monday - Friday from 8:00 am - 12:00 pm, and from 1:00 pm - 3:00 pm.  Walk-in patients are seen on a first come, first served basis, so try to arrive as early as possible for the best chance of being seen the same day.  There is an initial fee of $22.50: ° °     [facility] °     [address] °     [place], [place] [address] °     [phone] °
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
## 2017-12-23 DIAGNOSIS — Z5900 Homelessness unspecified: Secondary | ICD-10-CM | POA: Insufficient documentation

## 2018-04-03 ENCOUNTER — Emergency Department (HOSPITAL_COMMUNITY)
Admission: EM | Admit: 2018-04-03 | Discharge: 2018-04-04 | Disposition: A | Discharge status: Home / Self Care | Payer: Medicaid Other | Attending: Emergency Medicine | Admitting: Emergency Medicine

## 2018-04-03 ENCOUNTER — Emergency Department (HOSPITAL_COMMUNITY): Payer: Medicaid Other

## 2018-04-03 ENCOUNTER — Encounter (HOSPITAL_COMMUNITY): Payer: Self-pay | Admitting: Emergency Medicine

## 2018-04-03 DIAGNOSIS — J449 Chronic obstructive pulmonary disease, unspecified: Secondary | ICD-10-CM | POA: Insufficient documentation

## 2018-04-03 DIAGNOSIS — F17210 Nicotine dependence, cigarettes, uncomplicated: Secondary | ICD-10-CM | POA: Insufficient documentation

## 2018-04-03 DIAGNOSIS — Z79899 Other long term (current) drug therapy: Secondary | ICD-10-CM | POA: Insufficient documentation

## 2018-04-03 DIAGNOSIS — J40 Bronchitis, not specified as acute or chronic: Secondary | ICD-10-CM | POA: Insufficient documentation

## 2018-04-03 DIAGNOSIS — R0789 Other chest pain: Secondary | ICD-10-CM

## 2018-04-03 LAB — I-STAT TROPONIN, ED: TROPONIN I, POC: 0 ng/mL (ref 0.00–0.08)

## 2018-04-03 LAB — BASIC METABOLIC PANEL
ANION GAP: 17 — AB (ref 5–15)
BUN: 7 mg/dL (ref 6–20)
CALCIUM: 9.7 mg/dL (ref 8.9–10.3)
CO2: 22 mmol/L (ref 22–32)
Chloride: 101 mmol/L (ref 101–111)
Creatinine, Ser: 0.8 mg/dL (ref 0.44–1.00)
GLUCOSE: 101 mg/dL — AB (ref 65–99)
Potassium: 3.7 mmol/L (ref 3.5–5.1)
Sodium: 140 mmol/L (ref 135–145)

## 2018-04-03 LAB — CBC
HCT: 46.9 % — ABNORMAL HIGH (ref 36.0–46.0)
HEMOGLOBIN: 15.2 g/dL — AB (ref 12.0–15.0)
MCH: 32.1 pg (ref 26.0–34.0)
MCHC: 32.4 g/dL (ref 30.0–36.0)
MCV: 98.9 fL (ref 78.0–100.0)
PLATELETS: 391 10*3/uL (ref 150–400)
RBC: 4.74 MIL/uL (ref 3.87–5.11)
RDW: 13 % (ref 11.5–15.5)
WBC: 6.9 10*3/uL (ref 4.0–10.5)

## 2018-04-03 NOTE — ED Provider Notes (Signed)
Patient placed in Quick Look pathway, seen and evaluated   Chief Complaint: chest pain  HPI:   Royann ShiversDarlene A Marek is a.age female who presents to the ED via EMS with generalized chest pain. The pain started a few days ago when coughing hard. Patient is an every day smoker and hx of COPD and hx of substance abuse. Patient reports feeling short of breath and reports that today she coughed so hard she fell.   ROS: Resp: cough, shortness of breath  M/S: chest wall pain  Physical Exam:  BP 118/87 (BP Location: Left Arm)   Pulse 79   Temp 98.7 F (37.1 C) (Oral)   Resp 16   LMP 06/24/2012   SpO2 95%    Gen: No distress  Neuro: Awake and Alert  Skin: Warm and dry  Lungs: ronchi   Initiation of care has begun. The patient has been counseled on the process, plan, and necessity for staying for the completion/evaluation, and the remainder of the medical screening examination    Janne Napoleoneese, Hope M, NP 04/03/18 1911    Melene PlanFloyd, Dan, DO 04/03/18 [id number]

## 2018-04-03 NOTE — ED Triage Notes (Signed)
Pt arrives via EMS. Complains of epigastrium pain, worsens with cough. Pain has been going on for "long time" but worsened over the past 3 days. Denies n/v/sob/ diaphoresis.

## 2018-04-04 MED ORDER — ALBUTEROL SULFATE HFA 108 (90 BASE) MCG/ACT IN AERS
4.0000 | INHALATION_SPRAY | Freq: Once | RESPIRATORY_TRACT | Status: AC
Start: 2018-04-04 — End: 2018-04-04
  Administered 2018-04-04: 4 via RESPIRATORY_TRACT
  Filled 2018-04-04: qty 6.7

## 2018-04-04 MED ORDER — AZITHROMYCIN 250 MG PO TABS: 250.0000 mg | ORAL_TABLET | Freq: Every day | ORAL | 0 refills | Status: DC
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
## 2018-04-04 NOTE — ED Notes (Signed)
Pt demonstrated appropriate use of albuterol inhaler. 

## 2018-07-12 ENCOUNTER — Emergency Department (HOSPITAL_COMMUNITY)
Admission: EM | Admit: 2018-07-12 | Discharge: 2018-07-12 | Disposition: A | Discharge status: Home / Self Care | Payer: Medicaid Other | Attending: Emergency Medicine | Admitting: Emergency Medicine

## 2018-07-12 ENCOUNTER — Encounter (HOSPITAL_COMMUNITY): Payer: Self-pay

## 2018-07-12 ENCOUNTER — Other Ambulatory Visit: Payer: Self-pay

## 2018-07-12 DIAGNOSIS — J449 Chronic obstructive pulmonary disease, unspecified: Secondary | ICD-10-CM | POA: Insufficient documentation

## 2018-07-12 DIAGNOSIS — F17210 Nicotine dependence, cigarettes, uncomplicated: Secondary | ICD-10-CM | POA: Insufficient documentation

## 2018-07-12 DIAGNOSIS — R252 Cramp and spasm: Secondary | ICD-10-CM

## 2018-07-12 DIAGNOSIS — M62838 Other muscle spasm: Secondary | ICD-10-CM | POA: Insufficient documentation

## 2018-07-12 LAB — CBC WITH DIFFERENTIAL/PLATELET
Abs Immature Granulocytes: 0 10*3/uL (ref 0.0–0.1)
Basophils Absolute: 0.1 10*3/uL (ref 0.0–0.1)
Basophils Relative: 1 %
EOS PCT: 2 %
Eosinophils Absolute: 0.1 10*3/uL (ref 0.0–0.7)
HEMATOCRIT: 44.4 % (ref 36.0–46.0)
HEMOGLOBIN: 14.9 g/dL (ref 12.0–15.0)
Immature Granulocytes: 0 %
LYMPHS ABS: 1.7 10*3/uL (ref 0.7–4.0)
LYMPHS PCT: 33 %
MCH: 32.9 pg (ref 26.0–34.0)
MCHC: 33.6 g/dL (ref 30.0–36.0)
MCV: 98 fL (ref 78.0–100.0)
MONO ABS: 0.5 10*3/uL (ref 0.1–1.0)
Monocytes Relative: 10 %
Neutro Abs: 2.9 10*3/uL (ref 1.7–7.7)
Neutrophils Relative %: 54 %
Platelets: 276 10*3/uL (ref 150–400)
RBC: 4.53 MIL/uL (ref 3.87–5.11)
RDW: 12.5 % (ref 11.5–15.5)
WBC: 5.2 10*3/uL (ref 4.0–10.5)

## 2018-07-12 LAB — CK: CK TOTAL: 275 U/L — AB (ref 38–234)

## 2018-07-12 LAB — COMPREHENSIVE METABOLIC PANEL
ALK PHOS: 69 U/L (ref 38–126)
ALT: 27 U/L (ref 0–44)
ANION GAP: 11 (ref 5–15)
AST: 40 U/L (ref 15–41)
Albumin: 3.9 g/dL (ref 3.5–5.0)
BILIRUBIN TOTAL: 1.6 mg/dL — AB (ref 0.3–1.2)
BUN: 6 mg/dL (ref 6–20)
CALCIUM: 8.8 mg/dL — AB (ref 8.9–10.3)
CO2: 22 mmol/L (ref 22–32)
CREATININE: 0.76 mg/dL (ref 0.44–1.00)
Chloride: 103 mmol/L (ref 98–111)
GFR calc non Af Amer: >60 mL/min (ref >60)
Glucose, Bld: 118 mg/dL — ABNORMAL HIGH (ref 70–99)
Potassium: 3.8 mmol/L (ref 3.5–5.1)
Sodium: 136 mmol/L (ref 135–145)
Total Protein: 8 g/dL (ref 6.5–8.1)

## 2018-07-12 LAB — URINALYSIS, ROUTINE W REFLEX MICROSCOPIC
Bilirubin Urine: NEGATIVE
GLUCOSE, UA: NEGATIVE mg/dL
Ketones, ur: NEGATIVE mg/dL
Leukocytes, UA: NEGATIVE
NITRITE: NEGATIVE
Protein, ur: NEGATIVE mg/dL
SPECIFIC GRAVITY, URINE: 1.014 (ref 1.005–1.030)
pH: 5 (ref 5.0–8.0)

## 2018-07-12 LAB — RAPID URINE DRUG SCREEN, HOSP PERFORMED
AMPHETAMINES: NOT DETECTED
Barbiturates: NOT DETECTED
Benzodiazepines: NOT DETECTED
Cocaine: POSITIVE — AB
OPIATES: NOT DETECTED
TETRAHYDROCANNABINOL: NOT DETECTED

## 2018-07-12 LAB — ETHANOL: Alcohol, Ethyl (B): <10 mg/dL (ref <10)

## 2018-07-12 MED ORDER — SODIUM CHLORIDE 0.9 % IV BOLUS
1000.0000 mL | Freq: Once | INTRAVENOUS | Status: AC
  Administered 2018-07-12: 1000 mL via INTRAVENOUS

## 2018-07-12 MED ORDER — CYCLOBENZAPRINE HCL 5 MG PO TABS: 5.0000 mg | ORAL_TABLET | Freq: Three times a day (TID) | ORAL | 0 refills | Status: DC | PRN

## 2018-07-12 MED ORDER — CYCLOBENZAPRINE HCL 10 MG PO TABS
5.0000 mg | ORAL_TABLET | Freq: Once | ORAL | Status: AC
  Administered 2018-07-12: 5 mg via ORAL
  Filled 2018-07-12: qty 1

## 2018-07-12 NOTE — ED Notes (Signed)
Pt made aware that we need a urine sample, states she went prior to starting IV.

## 2018-07-12 NOTE — ED Notes (Signed)
ED Provider at bedside. 

## 2018-07-12 NOTE — Discharge Instructions (Signed)
Take motrin for pain.   Stay hydrated.   Take flexeril for muscle spasms.   See your doctor.   Return to ER if you have worse leg cramps, calf pain, trouble breathing, chest pain

## 2018-07-12 NOTE — ED Notes (Signed)
Pt verbalizes DC teaching, ambulated out of department, given [place] sandwich and drink.
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
## 2018-07-12 NOTE — ED Triage Notes (Signed)
Pt arrived via [facility] from home with c/o leg cramping that started around 2 am. Pt states that cramps extend up legs, into back and abdomen, reports similar issues about 2  Months ago but did not seek medical treatment. Pt used vinegar at home with little relief.

## 2018-10-19 ENCOUNTER — Emergency Department (HOSPITAL_COMMUNITY): Payer: Medicaid Other

## 2018-10-19 ENCOUNTER — Emergency Department (EMERGENCY_DEPARTMENT_HOSPITAL)
Admit: 2018-10-19 | Discharge: 2018-10-19 | Disposition: A | Discharge status: Home / Self Care | Payer: Medicaid Other | Attending: Emergency Medicine | Admitting: Emergency Medicine

## 2018-10-19 ENCOUNTER — Emergency Department (HOSPITAL_COMMUNITY)
Admission: EM | Admit: 2018-10-19 | Discharge: 2018-10-19 | Disposition: A | Discharge status: Home / Self Care | Payer: Medicaid Other | Attending: Emergency Medicine | Admitting: Emergency Medicine

## 2018-10-19 ENCOUNTER — Encounter (HOSPITAL_COMMUNITY): Payer: Self-pay | Admitting: Emergency Medicine

## 2018-10-19 DIAGNOSIS — R600 Localized edema: Secondary | ICD-10-CM

## 2018-10-19 DIAGNOSIS — M79604 Pain in right leg: Secondary | ICD-10-CM | POA: Insufficient documentation

## 2018-10-19 DIAGNOSIS — F17210 Nicotine dependence, cigarettes, uncomplicated: Secondary | ICD-10-CM | POA: Insufficient documentation

## 2018-10-19 DIAGNOSIS — M129 Arthropathy, unspecified: Secondary | ICD-10-CM | POA: Insufficient documentation

## 2018-10-19 DIAGNOSIS — M1611 Unilateral primary osteoarthritis, right hip: Secondary | ICD-10-CM | POA: Insufficient documentation

## 2018-10-19 DIAGNOSIS — J449 Chronic obstructive pulmonary disease, unspecified: Secondary | ICD-10-CM | POA: Insufficient documentation

## 2018-10-19 DIAGNOSIS — M47816 Spondylosis without myelopathy or radiculopathy, lumbar region: Secondary | ICD-10-CM

## 2018-10-19 LAB — I-STAT CHEM 8, ED
BUN: 11 mg/dL (ref 6–20)
Calcium, Ion: 1.15 mmol/L (ref 1.15–1.40)
Chloride: 103 mmol/L (ref 98–111)
Creatinine, Ser: 0.7 mg/dL (ref 0.44–1.00)
Glucose, Bld: 93 mg/dL (ref 70–99)
HEMATOCRIT: 46 % (ref 36.0–46.0)
Hemoglobin: 15.6 g/dL — ABNORMAL HIGH (ref 12.0–15.0)
POTASSIUM: 3.9 mmol/L (ref 3.5–5.1)
Sodium: 139 mmol/L (ref 135–145)
TCO2: 25 mmol/L (ref 22–32)

## 2018-10-19 MED ORDER — NAPROXEN 500 MG PO TABS: 500.0000 mg | ORAL_TABLET | Freq: Two times a day (BID) | ORAL | 0 refills | Status: DC

## 2018-10-19 MED ORDER — ACETAMINOPHEN 500 MG PO TABS
1000.0000 mg | ORAL_TABLET | Freq: Once | ORAL | Status: AC
  Administered 2018-10-19: 1000 mg via ORAL
  Filled 2018-10-19: qty 2

## 2018-10-19 MED ORDER — CYCLOBENZAPRINE HCL 10 MG PO TABS: 10.0000 mg | ORAL_TABLET | Freq: Two times a day (BID) | ORAL | 0 refills | Status: DC | PRN

## 2018-10-19 NOTE — ED Notes (Signed)
Pt given [place] sandwich, 2 cokes and bus pass

## 2018-10-19 NOTE — ED Notes (Signed)
Patient continues to not be in room.

## 2018-10-19 NOTE — ED Triage Notes (Addendum)
Pt states she has been having right leg pain for years. The last month she has experienced numbness/tingling in right leg. Pt has started falling over the past week due to the pain/numbness in right leg. Denies any injury from falls; did not hit head.

## 2018-10-19 NOTE — Progress Notes (Signed)
Right lower extremity venous duplex has been completed. Negative for DVT. Results were given to Dr. Dalene Seltzer.  10/19/18 6:06 PM Olen Cordial RVT
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
## 2018-10-19 NOTE — ED Notes (Signed)
Went to room to attempt for I-stat, patient not in room

## 2019-05-11 ENCOUNTER — Ambulatory Visit: Payer: Self-pay | Attending: Family Medicine | Admitting: Family Medicine

## 2019-05-11 ENCOUNTER — Other Ambulatory Visit: Payer: Self-pay

## 2019-05-11 ENCOUNTER — Encounter: Payer: Self-pay | Admitting: Family Medicine

## 2019-05-11 VITALS — BP 138/84 | HR 62 | Temp 97.7°F | Ht 61.0 in | Wt 187.0 lb

## 2019-05-11 DIAGNOSIS — M1711 Unilateral primary osteoarthritis, right knee: Secondary | ICD-10-CM

## 2019-05-11 DIAGNOSIS — H539 Unspecified visual disturbance: Secondary | ICD-10-CM

## 2019-05-11 DIAGNOSIS — M1611 Unilateral primary osteoarthritis, right hip: Secondary | ICD-10-CM

## 2019-05-11 NOTE — Progress Notes (Signed)
Patient is having trouble with eyes.
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
## 2019-06-06 ENCOUNTER — Ambulatory Visit: Payer: Self-pay | Attending: Family Medicine | Admitting: Family Medicine

## 2019-06-06 ENCOUNTER — Other Ambulatory Visit: Payer: Self-pay

## 2019-06-06 ENCOUNTER — Other Ambulatory Visit (HOSPITAL_COMMUNITY)
Admission: RE | Admit: 2019-06-06 | Discharge: 2019-06-06 | Disposition: A | Discharge status: Home / Self Care | Payer: Medicaid Other | Source: Ambulatory Visit | Attending: Family Medicine | Admitting: Family Medicine

## 2019-06-06 ENCOUNTER — Encounter: Payer: Self-pay | Admitting: Family Medicine

## 2019-06-06 VITALS — BP 120/76 | HR 77 | Temp 98.2°F | Ht 61.0 in | Wt 187.8 lb

## 2019-06-06 DIAGNOSIS — Z1159 Encounter for screening for other viral diseases: Secondary | ICD-10-CM

## 2019-06-06 DIAGNOSIS — Z780 Asymptomatic menopausal state: Secondary | ICD-10-CM | POA: Insufficient documentation

## 2019-06-06 DIAGNOSIS — Z1239 Encounter for other screening for malignant neoplasm of breast: Secondary | ICD-10-CM

## 2019-06-06 DIAGNOSIS — J438 Other emphysema: Secondary | ICD-10-CM

## 2019-06-06 DIAGNOSIS — Z0001 Encounter for general adult medical examination with abnormal findings: Secondary | ICD-10-CM

## 2019-06-06 DIAGNOSIS — F17210 Nicotine dependence, cigarettes, uncomplicated: Secondary | ICD-10-CM

## 2019-06-06 DIAGNOSIS — Z13228 Encounter for screening for other metabolic disorders: Secondary | ICD-10-CM

## 2019-06-06 DIAGNOSIS — Z124 Encounter for screening for malignant neoplasm of cervix: Secondary | ICD-10-CM

## 2019-06-06 DIAGNOSIS — Z1151 Encounter for screening for human papillomavirus (HPV): Secondary | ICD-10-CM | POA: Diagnosis not present

## 2019-06-06 DIAGNOSIS — Z720 Tobacco use: Secondary | ICD-10-CM

## 2019-06-06 DIAGNOSIS — Z0000 Encounter for general adult medical examination without abnormal findings: Secondary | ICD-10-CM

## 2019-06-06 DIAGNOSIS — Z1211 Encounter for screening for malignant neoplasm of colon: Secondary | ICD-10-CM

## 2019-06-06 DIAGNOSIS — J449 Chronic obstructive pulmonary disease, unspecified: Secondary | ICD-10-CM | POA: Insufficient documentation

## 2019-06-06 MED ORDER — ALBUTEROL SULFATE HFA 108 (90 BASE) MCG/ACT IN AERS: 2.0000 | INHALATION_SPRAY | Freq: Four times a day (QID) | RESPIRATORY_TRACT | 1 refills | Status: DC | PRN

## 2019-06-06 MED ORDER — BUPROPION HCL ER (XL) 150 MG PO TB24: 150.0000 mg | ORAL_TABLET | Freq: Every day | ORAL | 3 refills | Status: DC
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
## 2019-06-06 NOTE — Patient Instructions (Signed)
Health Maintenance, Female Adopting a healthy lifestyle and getting preventive care are important in promoting health and wellness. Ask your health care provider about:  The right schedule for you to have regular tests and exams.  Things you can do on your own to prevent diseases and keep yourself healthy. What should I know about diet, weight, and exercise? Eat a healthy diet   Eat a diet that includes plenty of vegetables, fruits, low-fat dairy products, and lean protein.  Do not eat a lot of foods that are high in solid fats, added sugars, or sodium. Maintain a healthy weight Body mass index (BMI) is used to identify weight problems. It estimates body fat based on height and weight. Your health care provider can help determine your BMI and help you achieve or maintain a healthy weight. Get regular exercise Get regular exercise. This is one of the most important things you can do for your health. Most adults should:  Exercise for at least 150 minutes each week. The exercise should increase your heart rate and make you sweat (moderate-intensity exercise).  Do strengthening exercises at least twice a week. This is in addition to the moderate-intensity exercise.  Spend less time sitting. Even light physical activity can be beneficial. Watch cholesterol and blood lipids Have your blood tested for lipids and cholesterol at 55 years of age, then have this test every 5 years. Have your cholesterol levels checked more often if:  Your lipid or cholesterol levels are high.  You are older than 55 years of age.  You are at high risk for heart disease. What should I know about cancer screening? Depending on your health history and family history, you may need to have cancer screening at various ages. This may include screening for:  Breast cancer.  Cervical cancer.  Colorectal cancer.  Skin cancer.  Lung cancer. What should I know about heart disease, diabetes, and high blood  pressure? Blood pressure and heart disease  High blood pressure causes heart disease and increases the risk of stroke. This is more likely to develop in people who have high blood pressure readings, are of African descent, or are overweight.  Have your blood pressure checked: ? Every 3-5 years if you are 18-39 years of age. ? Every year if you are 40 years old or older. Diabetes Have regular diabetes screenings. This checks your fasting blood sugar level. Have the screening done:  Once every three years after age 40 if you are at a normal weight and have a low risk for diabetes.  More often and at a younger age if you are overweight or have a high risk for diabetes. What should I know about preventing infection? Hepatitis B If you have a higher risk for hepatitis B, you should be screened for this virus. Talk with your health care provider to find out if you are at risk for hepatitis B infection. Hepatitis C Testing is recommended for:  Everyone born from 1945 through 1965.  Anyone with known risk factors for hepatitis C. Sexually transmitted infections (STIs)  Get screened for STIs, including gonorrhea and chlamydia, if: ? You are sexually active and are younger than 55 years of age. ? You are older than 55 years of age and your health care provider tells you that you are at risk for this type of infection. ? Your sexual activity has changed since you were last screened, and you are at increased risk for chlamydia or gonorrhea. Ask your health care provider if   you are at risk.  Ask your health care provider about whether you are at high risk for HIV. Your health care provider may recommend a prescription medicine to help prevent HIV infection. If you choose to take medicine to prevent HIV, you should first get tested for HIV. You should then be tested every 3 months for as long as you are taking the medicine. Pregnancy  If you are about to stop having your period (premenopausal) and  you may become pregnant, seek counseling before you get pregnant.  Take 400 to 800 micrograms (mcg) of folic acid every day if you become pregnant.  Ask for birth control (contraception) if you want to prevent pregnancy. Osteoporosis and menopause Osteoporosis is a disease in which the bones lose minerals and strength with aging. This can result in bone fractures. If you are 65 years old or older, or if you are at risk for osteoporosis and fractures, ask your health care provider if you should:  Be screened for bone loss.  Take a calcium or vitamin D supplement to lower your risk of fractures.  Be given hormone replacement therapy (HRT) to treat symptoms of menopause. Follow these instructions at home: Lifestyle  Do not use any products that contain nicotine or tobacco, such as cigarettes, e-cigarettes, and chewing tobacco. If you need help quitting, ask your health care provider.  Do not use street drugs.  Do not share needles.  Ask your health care provider for help if you need support or information about quitting drugs. Alcohol use  Do not drink alcohol if: ? Your health care provider tells you not to drink. ? You are pregnant, may be pregnant, or are planning to become pregnant.  If you drink alcohol: ? Limit how much you use to 0-1 drink a day. ? Limit intake if you are breastfeeding.  Be aware of how much alcohol is in your drink. In the [place] one drink equals one 12 oz bottle of beer (355 mL), one 5 oz glass of wine (148 mL), or one 1 oz glass of hard liquor (44 mL). General instructions  Schedule regular health, dental, and eye exams.  Stay current with your vaccines.  Tell your health care provider if: ? You often feel depressed. ? You have ever been abused or do not feel safe at home. Summary  Adopting a healthy lifestyle and getting preventive care are important in promoting health and wellness.  Follow your health care provider's instructions about healthy  diet, exercising, and getting tested or screened for diseases.  Follow your health care provider's instructions on monitoring your cholesterol and blood pressure. This information is not intended to replace advice given to you by your health care provider. Make sure you discuss any questions you have with your health care provider. Document Released: 04/12/2011 Document Revised: 09/20/2018 Document Reviewed: 09/20/2018 Elsevier Patient Education  2020 Elsevier Inc.  

## 2019-06-07 LAB — HEPATITIS C ANTIBODY (REFLEX): HCV Ab: <0.1 s/co ratio (ref 0.0–0.9)

## 2019-06-07 LAB — HIV ANTIBODY (ROUTINE TESTING W REFLEX): HIV Screen 4th Generation wRfx: NONREACTIVE

## 2019-06-07 LAB — LIPID PANEL
Chol/HDL Ratio: 3.3 ratio (ref 0.0–4.4)
Cholesterol, Total: 162 mg/dL (ref 100–199)
HDL: 49 mg/dL (ref >39)
LDL Calculated: 62 mg/dL (ref 0–99)
Triglycerides: 255 mg/dL — ABNORMAL HIGH (ref 0–149)
VLDL Cholesterol Cal: 51 mg/dL — ABNORMAL HIGH (ref 5–40)

## 2019-06-07 LAB — HCV COMMENT:

## 2019-06-08 ENCOUNTER — Other Ambulatory Visit: Payer: Self-pay | Admitting: Family Medicine

## 2019-06-08 LAB — CYTOLOGY - PAP
Diagnosis: NEGATIVE
HPV: NOT DETECTED

## 2019-06-08 MED ORDER — METRONIDAZOLE 500 MG PO TABS: 2000.0000 mg | ORAL_TABLET | Freq: Once | ORAL | 0 refills | Status: AC

## 2019-06-08 MED FILL — metroNIDAZOLE 500 MG TABS: 500 | 1 days supply | Qty: 4 | Fill #0

## 2019-06-13 ENCOUNTER — Telehealth: Payer: Self-pay

## 2019-06-13 NOTE — Telephone Encounter (Signed)
-----   Message from Charlott Rakes, MD sent at 06/07/2019  2:06 PM EDT ----- Please advise to commence omega 3 caps as her triglycerides are elevated. All other labs are stable.

## 2019-06-13 NOTE — Telephone Encounter (Signed)
Patient name and DOB has been verified Patient was informed of lab results. Patient had no questions.  

## 2019-06-13 NOTE — Telephone Encounter (Signed)
-----   Message from Charlott Rakes, MD sent at 06/08/2019 12:11 PM EDT ----- Labs reveal normal PAP but she hasTrichomonas which is an STD. I have sent a rx to her Pharmacy and her partner will need to be treated. She will need a repeat test in 3 months

## 2019-06-19 MED FILL — BUPROPION HCL XL 150 MG TAB: 150 | 60 days supply | Qty: 60 | Fill #0

## 2019-06-19 MED FILL — ALBUTEROL SULFATE HFA 108 (: 108 (90 BAS | 25 days supply | Qty: 18 | Fill #0

## 2019-06-20 LAB — CERVICOVAGINAL ANCILLARY ONLY
Chlamydia: NEGATIVE
Neisseria Gonorrhea: NEGATIVE
Trichomonas: POSITIVE — AB

## 2019-06-26 ENCOUNTER — Other Ambulatory Visit: Payer: Self-pay

## 2019-06-26 ENCOUNTER — Emergency Department (HOSPITAL_COMMUNITY)
Admission: EM | Admit: 2019-06-26 | Discharge: 2019-06-26 | Discharge status: Against Medical Advice | Payer: Medicaid Other

## 2019-06-26 NOTE — ED Notes (Signed)
Called for patient in waiting room no answer X 2

## 2019-06-26 NOTE — ED Notes (Signed)
Unable to locate in waiting room

## 2019-06-26 NOTE — ED Triage Notes (Signed)
Pt called 3 times for triage with no answer

## 2019-07-24 ENCOUNTER — Ambulatory Visit: Payer: Medicaid Other

## 2019-09-10 ENCOUNTER — Ambulatory Visit: Payer: Self-pay | Attending: Family Medicine | Admitting: Family Medicine

## 2019-09-10 ENCOUNTER — Other Ambulatory Visit: Payer: Self-pay

## 2019-09-10 DIAGNOSIS — J438 Other emphysema: Secondary | ICD-10-CM

## 2019-09-10 DIAGNOSIS — N898 Other specified noninflammatory disorders of vagina: Secondary | ICD-10-CM

## 2019-09-10 DIAGNOSIS — M5431 Sciatica, right side: Secondary | ICD-10-CM

## 2019-09-10 MED ORDER — FLUCONAZOLE 150 MG PO TABS: 150.0000 mg | ORAL_TABLET | Freq: Once | ORAL | 0 refills | Status: AC

## 2019-09-10 MED ORDER — ALBUTEROL SULFATE HFA 108 (90 BASE) MCG/ACT IN AERS: 2.0000 | INHALATION_SPRAY | Freq: Four times a day (QID) | RESPIRATORY_TRACT | 3 refills | Status: DC | PRN

## 2019-09-10 MED ORDER — DICLOFENAC SODIUM 75 MG PO TBEC: 75.0000 mg | DELAYED_RELEASE_TABLET | Freq: Two times a day (BID) | ORAL | 1 refills | Status: DC

## 2019-09-10 MED ORDER — METRONIDAZOLE 0.75 % VA GEL: 1.0000 | Freq: Every day | VAGINAL | 0 refills | Status: DC

## 2019-09-10 MED ORDER — CYCLOBENZAPRINE HCL 10 MG PO TABS: 10.0000 mg | ORAL_TABLET | Freq: Two times a day (BID) | ORAL | 1 refills | Status: DC | PRN

## 2019-09-10 MED FILL — metroNIDAZOLE 0.75 % GEL: 0.75 | 5 days supply | Qty: 70 | Fill #0

## 2019-09-10 MED FILL — FLUCONAZOLE 150 MG TABLET: 150 | 1 days supply | Qty: 1 | Fill #0

## 2019-09-10 MED FILL — CYCLOBENZAPRINE 10 MG TAB: 10 | 30 days supply | Qty: 60 | Fill #0

## 2019-09-10 MED FILL — ALBUTEROL SULFATE HFA 108 (: 108 (90 BAS | 25 days supply | Qty: 18 | Fill #0

## 2019-09-10 MED FILL — DICLOFENAC SOD EC 75 MG TAB: 75 | 30 days supply | Qty: 60 | Fill #0
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
## 2019-09-10 NOTE — Progress Notes (Signed)
Patient has been called and DOB has been verified. Patient has been screened and transferred to PCP to start phone visit.   Patient is having leg pain.  Patient is having vaginal itching and burning.

## 2019-09-17 ENCOUNTER — Ambulatory Visit: Payer: Medicaid Other

## 2019-12-27 ENCOUNTER — Ambulatory Visit: Payer: Self-pay | Attending: Internal Medicine

## 2020-01-21 ENCOUNTER — Ambulatory Visit: Payer: Medicaid Other | Attending: Internal Medicine

## 2020-02-25 ENCOUNTER — Ambulatory Visit: Payer: Medicaid Other | Attending: Internal Medicine

## 2020-02-25 DIAGNOSIS — Z23 Encounter for immunization: Secondary | ICD-10-CM

## 2020-02-25 NOTE — Progress Notes (Signed)
   Covid-19 Vaccination Clinic  Name:  Peggy Warren    MRN: [id number] DOB: Jan 19, 1966  02/25/2020  Peggy Warren was observed post Covid-19 immunization for 15 minutes without incident. She was provided with Vaccine Information Sheet and instruction to access the V-Safe system.   Peggy Warren was instructed to call 911 with any severe reactions post vaccine: Marland Kitchen Difficulty breathing  . Swelling of face and throat  . A fast heartbeat  . A bad rash all over body  . Dizziness and weakness   Immunizations Administered    Name Date Dose VIS Date Route   Pfizer COVID-19 Vaccine 02/25/2020  8:57 AM 0.3 mL 12/05/2018 Intramuscular   Manufacturer: [employer], [employer]   Lot: [id number]   NDC: [address]-4

## 2020-03-02 ENCOUNTER — Emergency Department (HOSPITAL_COMMUNITY)
Admission: EM | Admit: 2020-03-02 | Discharge: 2020-03-02 | Disposition: A | Discharge status: Home / Self Care | Payer: Self-pay | Attending: Emergency Medicine | Admitting: Emergency Medicine

## 2020-03-02 ENCOUNTER — Encounter (HOSPITAL_COMMUNITY): Payer: Self-pay

## 2020-03-02 ENCOUNTER — Emergency Department (HOSPITAL_COMMUNITY): Payer: Self-pay

## 2020-03-02 DIAGNOSIS — R062 Wheezing: Secondary | ICD-10-CM | POA: Insufficient documentation

## 2020-03-02 DIAGNOSIS — Z20822 Contact with and (suspected) exposure to covid-19: Secondary | ICD-10-CM | POA: Insufficient documentation

## 2020-03-02 DIAGNOSIS — F17210 Nicotine dependence, cigarettes, uncomplicated: Secondary | ICD-10-CM | POA: Insufficient documentation

## 2020-03-02 DIAGNOSIS — J441 Chronic obstructive pulmonary disease with (acute) exacerbation: Secondary | ICD-10-CM | POA: Insufficient documentation

## 2020-03-02 DIAGNOSIS — Z79899 Other long term (current) drug therapy: Secondary | ICD-10-CM | POA: Insufficient documentation

## 2020-03-02 LAB — SARS CORONAVIRUS 2 BY RT PCR (HOSPITAL ORDER, PERFORMED IN ~~LOC~~ HOSPITAL LAB): SARS Coronavirus 2: NEGATIVE

## 2020-03-02 LAB — BASIC METABOLIC PANEL
Anion gap: 13 (ref 5–15)
BUN: 5 mg/dL — ABNORMAL LOW (ref 6–20)
CO2: 18 mmol/L — ABNORMAL LOW (ref 22–32)
Calcium: 8.9 mg/dL (ref 8.9–10.3)
Chloride: 102 mmol/L (ref 98–111)
Creatinine, Ser: 0.61 mg/dL (ref 0.44–1.00)
GFR calc Af Amer: >60 mL/min (ref >60)
GFR calc non Af Amer: >60 mL/min (ref >60)
Glucose, Bld: 113 mg/dL — ABNORMAL HIGH (ref 70–99)
Potassium: 3.9 mmol/L (ref 3.5–5.1)
Sodium: 133 mmol/L — ABNORMAL LOW (ref 135–145)

## 2020-03-02 LAB — CBC
HCT: 44.5 % (ref 36.0–46.0)
Hemoglobin: 15.3 g/dL — ABNORMAL HIGH (ref 12.0–15.0)
MCH: 33.7 pg (ref 26.0–34.0)
MCHC: 34.4 g/dL (ref 30.0–36.0)
MCV: 98 fL (ref 80.0–100.0)
Platelets: 302 10*3/uL (ref 150–400)
RBC: 4.54 MIL/uL (ref 3.87–5.11)
RDW: 12.6 % (ref 11.5–15.5)
WBC: 6.2 10*3/uL (ref 4.0–10.5)
nRBC: 0 % (ref 0.0–0.2)

## 2020-03-02 LAB — I-STAT BETA HCG BLOOD, ED (MC, WL, AP ONLY): I-stat hCG, quantitative: <5 m[IU]/mL (ref <5)

## 2020-03-02 MED ORDER — METHYLPREDNISOLONE SODIUM SUCC 125 MG IJ SOLR
125.0000 mg | Freq: Every day | INTRAMUSCULAR | Status: DC
  Administered 2020-03-02: 125 mg via INTRAVENOUS
  Filled 2020-03-02: qty 2

## 2020-03-02 MED ORDER — IPRATROPIUM-ALBUTEROL 0.5-2.5 (3) MG/3ML IN SOLN
3.0000 mL | Freq: Once | RESPIRATORY_TRACT | Status: AC
  Administered 2020-03-02: 3 mL via RESPIRATORY_TRACT
  Filled 2020-03-02: qty 3

## 2020-03-02 MED ORDER — ALBUTEROL SULFATE HFA 108 (90 BASE) MCG/ACT IN AERS
8.0000 | INHALATION_SPRAY | Freq: Once | RESPIRATORY_TRACT | Status: AC
  Administered 2020-03-02: 8 via RESPIRATORY_TRACT
  Filled 2020-03-02: qty 6.7

## 2020-03-02 MED ORDER — PREDNISONE 10 MG PO TABS
ORAL_TABLET | ORAL | 0 refills | Status: DC
Start: 2020-03-02 — End: 2020-06-29
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
## 2020-03-02 NOTE — ED Triage Notes (Signed)
Pt comes via [facility] for SOB for the past day, hx of COPD, pt also having cramping everywhere. PTA received 125 of solumedrol

## 2020-03-02 NOTE — ED Notes (Signed)
RN found pt resting comfortably.  Reports she has had a cough for "a few months but my cramps are back in my abdomen."

## 2020-03-02 NOTE — Discharge Instructions (Signed)
See your Physician for recheck.  °

## 2020-03-03 MED FILL — predniSONE 10 MG TABS: 10 | 6 days supply | Qty: 21 | Fill #0

## 2020-03-17 ENCOUNTER — Ambulatory Visit: Payer: Medicaid Other | Attending: Internal Medicine

## 2020-03-29 ENCOUNTER — Ambulatory Visit: Payer: Medicaid Other | Attending: Internal Medicine

## 2020-03-29 DIAGNOSIS — Z23 Encounter for immunization: Secondary | ICD-10-CM

## 2020-03-29 NOTE — Progress Notes (Signed)
   Covid-19 Vaccination Clinic  Name:  Peggy Warren    MRN: [id number] DOB: 24-Dec-1965  03/29/2020  Peggy Warren was observed post Covid-19 immunization for 15 minutes without incident. She was provided with Vaccine Information Sheet and instruction to access the V-Safe system.   Peggy Warren was instructed to call 911 with any severe reactions post vaccine: Marland Kitchen Difficulty breathing  . Swelling of face and throat  . A fast heartbeat  . A bad rash all over body  . Dizziness and weakness   Immunizations Administered    Name Date Dose VIS Date Route   Pfizer COVID-19 Vaccine 03/29/2020  9:59 AM 0.3 mL 12/05/2018 Intramuscular   Manufacturer: [employer], [employer]   Lot: [id number]   NDC: [address]-1

## 2020-04-15 ENCOUNTER — Ambulatory Visit: Payer: Medicaid Other | Admitting: Family Medicine

## 2020-04-17 ENCOUNTER — Other Ambulatory Visit: Payer: Self-pay | Admitting: Family Medicine

## 2020-04-17 DIAGNOSIS — M5431 Sciatica, right side: Secondary | ICD-10-CM

## 2020-04-17 DIAGNOSIS — J438 Other emphysema: Secondary | ICD-10-CM

## 2020-04-17 MED ORDER — ALBUTEROL SULFATE HFA 108 (90 BASE) MCG/ACT IN AERS: 2.0000 | INHALATION_SPRAY | Freq: Four times a day (QID) | RESPIRATORY_TRACT | 3 refills | Status: DC | PRN

## 2020-04-17 MED ORDER — CYCLOBENZAPRINE HCL 10 MG PO TABS: 10.0000 mg | ORAL_TABLET | Freq: Two times a day (BID) | ORAL | 0 refills | Status: DC | PRN

## 2020-04-17 MED ORDER — DICLOFENAC SODIUM 75 MG PO TBEC: 75.0000 mg | DELAYED_RELEASE_TABLET | Freq: Two times a day (BID) | ORAL | 0 refills | Status: DC

## 2020-04-17 MED FILL — ALBUTEROL SULFATE HFA 108 (: 108 (90 BAS | 28 days supply | Qty: 18 | Fill #0

## 2020-04-17 MED FILL — DICLOFENAC SOD EC 75 MG TAB: 75 | 30 days supply | Qty: 60 | Fill #0

## 2020-04-17 NOTE — Telephone Encounter (Signed)
Requested medication (s) are due for refill today -yes  Requested medication (s) are on the active medication list -yes  Future visit scheduled -yes  Last refill: 09/09/20 1 RF  Notes to clinic: request RF of non delegated Rx  Requested Prescriptions  Pending Prescriptions Disp Refills   cyclobenzaprine (FLEXERIL) 10 MG tablet 60 tablet 1    Sig: Take 1 tablet (10 mg total) by mouth 2 (two) times daily as needed for muscle spasms.      Not Delegated - Analgesics:  Muscle Relaxants Failed - 04/17/2020 11:48 AM      Failed - This refill cannot be delegated      Failed - Valid encounter within last 6 months    Recent Outpatient Visits           7 months ago Sciatica of right side   [facility] [place], Odette Horns, MD   10 months ago Annual physical exam   [facility] [place], Odette Horns, MD   11 months ago Primary osteoarthritis of right knee   [facility] [place], Odette Horns, MD       Future Appointments             In 1 month Hoy Register, MD [facility]             Signed Prescriptions Disp Refills   albuterol (VENTOLIN HFA) 108 (90 Base) MCG/ACT inhaler 18 g 3    Sig: Inhale 2 puffs into the lungs every 6 (six) hours as needed for wheezing or shortness of breath.      Pulmonology:  Beta Agonists Failed - 04/17/2020 11:48 AM      Failed - One inhaler should last at least one month. If the patient is requesting refills earlier, contact the patient to check for uncontrolled symptoms.      Passed - Valid encounter within last 12 months    Recent Outpatient Visits           7 months ago Sciatica of right side   [facility] [place], Odette Horns, MD   10 months ago Annual physical exam   [facility] [place], Odette Horns, MD   11 months ago Primary osteoarthritis of right knee   [facility] [place], Odette Horns, MD       Future Appointments             In 1 month Hoy Register, MD [facility]              diclofenac (VOLTAREN) 75 MG EC tablet 60 tablet 0    Sig: Take 1 tablet (75 mg total) by mouth 2 (two) times daily.      Analgesics:  NSAIDS Failed - 04/17/2020 11:48 AM      Failed - HGB in normal range and within 360 days    Hemoglobin  Date Value Ref Range Status  03/02/2020 15.3 (H) 12.0 - 15.0 g/dL Final          Passed - Cr in normal range and within 360 days    Creatinine, Ser  Date Value Ref Range Status  03/02/2020 0.61 0.44 - 1.00 mg/dL Final          Passed - Patient is not pregnant      Passed - Valid encounter within last 12 months    Recent Outpatient Visits  7 months ago Sciatica of right side   [facility] [place], Odette Horns, MD   10 months ago Annual physical exam   [facility] Hoy Register, MD   11 months ago Primary osteoarthritis of right knee   [facility] [place], Odette Horns, MD       Future Appointments             In 1 month Hoy Register, MD [facility]                Requested Prescriptions  Pending Prescriptions Disp Refills   cyclobenzaprine (FLEXERIL) 10 MG tablet 60 tablet 1    Sig: Take 1 tablet (10 mg total) by mouth 2 (two) times daily as needed for muscle spasms.      Not Delegated - Analgesics:  Muscle Relaxants Failed - 04/17/2020 11:48 AM      Failed - This refill cannot be delegated      Failed - Valid encounter within last 6 months    Recent Outpatient Visits           7 months ago Sciatica of right side   [facility] [place], Odette Horns, MD   10 months ago Annual physical exam   [facility] [place], Odette Horns, MD   11 months ago Primary osteoarthritis of right knee   [facility] [place], Odette Horns, MD       Future Appointments             In 1 month Hoy Register, MD [facility]             Signed Prescriptions Disp Refills   albuterol (VENTOLIN HFA) 108 (90 Base) MCG/ACT inhaler 18 g 3    Sig: Inhale 2 puffs into the lungs every 6 (six) hours as needed for wheezing or shortness of breath.      Pulmonology:  Beta Agonists Failed - 04/17/2020 11:48 AM      Failed - One inhaler should last at least one month. If the patient is requesting refills earlier, contact the patient to check for uncontrolled symptoms.      Passed - Valid encounter within last 12 months    Recent Outpatient Visits           7 months ago Sciatica of right side   [facility] [place], Odette Horns, MD   10 months ago Annual physical exam   [facility] [place], Odette Horns, MD   11 months ago Primary osteoarthritis of right knee   [facility] [place], Odette Horns, MD       Future Appointments             In 1 month Hoy Register, MD [facility]              diclofenac (VOLTAREN) 75 MG EC tablet 60 tablet 0    Sig: Take 1 tablet (75 mg total) by mouth 2 (two) times daily.      Analgesics:  NSAIDS Failed - 04/17/2020 11:48 AM      Failed - HGB in normal range and within 360 days    Hemoglobin  Date Value Ref Range Status  03/02/2020 15.3 (H) 12.0 - 15.0 g/dL Final  Passed - Cr in normal range and within 360 days    Creatinine, Ser  Date Value Ref Range Status  03/02/2020 0.61 0.44 - 1.00 mg/dL Final          Passed - Patient is not pregnant      Passed - Valid encounter within last 12 months    Recent Outpatient Visits           7 months ago Sciatica of right side   [facility] [place], Odette Horns, MD   10 months ago Annual physical exam   [facility] Hoy Register, MD   11 months ago Primary osteoarthritis of right knee   [facility] Hoy Register, MD       Future Appointments             In 1 month Hoy Register, MD [facility]

## 2020-04-17 NOTE — Telephone Encounter (Signed)
Pt needs refill for albuterol (VENTOLIN HFA) 108 (90 Base) MCG/ACT inhaler She also asked for refill for her pain meds and muscle relaxers but does not know the names/ please advise and call when ready   [facility] - [place], [place] - [place] [address] [employer] Phone:  [phone]  Fax:  [phone]

## 2020-04-18 MED FILL — CYCLOBENZAPRINE 10 MG TAB: 10 | 30 days supply | Qty: 60 | Fill #0

## 2020-05-07 ENCOUNTER — Other Ambulatory Visit: Payer: Self-pay

## 2020-05-07 ENCOUNTER — Emergency Department (HOSPITAL_COMMUNITY)
Admission: EM | Admit: 2020-05-07 | Discharge: 2020-05-08 | Disposition: A | Discharge status: Home / Self Care | Payer: Medicaid Other | Attending: Emergency Medicine | Admitting: Emergency Medicine

## 2020-05-07 ENCOUNTER — Encounter (HOSPITAL_COMMUNITY): Payer: Self-pay | Admitting: *Deleted

## 2020-05-07 DIAGNOSIS — R220 Localized swelling, mass and lump, head: Secondary | ICD-10-CM | POA: Insufficient documentation

## 2020-05-07 DIAGNOSIS — H9202 Otalgia, left ear: Secondary | ICD-10-CM | POA: Diagnosis not present

## 2020-05-07 DIAGNOSIS — Z5321 Procedure and treatment not carried out due to patient leaving prior to being seen by health care provider: Secondary | ICD-10-CM | POA: Insufficient documentation

## 2020-05-07 LAB — CBC WITH DIFFERENTIAL/PLATELET
Abs Immature Granulocytes: 0.02 10*3/uL (ref 0.00–0.07)
Basophils Absolute: 0.1 10*3/uL (ref 0.0–0.1)
Basophils Relative: 1 %
Eosinophils Absolute: 0.1 10*3/uL (ref 0.0–0.5)
Eosinophils Relative: 2 %
HCT: 43.8 % (ref 36.0–46.0)
Hemoglobin: 14.3 g/dL (ref 12.0–15.0)
Immature Granulocytes: 0 %
Lymphocytes Relative: 42 %
Lymphs Abs: 2.8 10*3/uL (ref 0.7–4.0)
MCH: 32.8 pg (ref 26.0–34.0)
MCHC: 32.6 g/dL (ref 30.0–36.0)
MCV: 100.5 fL — ABNORMAL HIGH (ref 80.0–100.0)
Monocytes Absolute: 0.5 10*3/uL (ref 0.1–1.0)
Monocytes Relative: 7 %
Neutro Abs: 3.3 10*3/uL (ref 1.7–7.7)
Neutrophils Relative %: 48 %
Platelets: 350 10*3/uL (ref 150–400)
RBC: 4.36 MIL/uL (ref 3.87–5.11)
RDW: 12.3 % (ref 11.5–15.5)
WBC: 6.7 10*3/uL (ref 4.0–10.5)
nRBC: 0 % (ref 0.0–0.2)

## 2020-05-07 LAB — COMPREHENSIVE METABOLIC PANEL
ALT: 41 U/L (ref 0–44)
AST: 50 U/L — ABNORMAL HIGH (ref 15–41)
Albumin: 3.8 g/dL (ref 3.5–5.0)
Alkaline Phosphatase: 70 U/L (ref 38–126)
Anion gap: 10 (ref 5–15)
BUN: 9 mg/dL (ref 6–20)
CO2: 25 mmol/L (ref 22–32)
Calcium: 8.9 mg/dL (ref 8.9–10.3)
Chloride: 104 mmol/L (ref 98–111)
Creatinine, Ser: 0.86 mg/dL (ref 0.44–1.00)
GFR calc Af Amer: >60 mL/min (ref >60)
GFR calc non Af Amer: >60 mL/min (ref >60)
Glucose, Bld: 140 mg/dL — ABNORMAL HIGH (ref 70–99)
Potassium: 4.1 mmol/L (ref 3.5–5.1)
Sodium: 139 mmol/L (ref 135–145)
Total Bilirubin: 0.8 mg/dL (ref 0.3–1.2)
Total Protein: 7.3 g/dL (ref 6.5–8.1)

## 2020-05-07 LAB — I-STAT BETA HCG BLOOD, ED (MC, WL, AP ONLY): I-stat hCG, quantitative: <5 m[IU]/mL (ref <5)

## 2020-05-07 NOTE — ED Triage Notes (Signed)
Pt reports two days of left side facial swelling and left ear pain. Feels like the swelling is increasing and spreading down her neck. Denies fever. Airway intact at triage.

## 2020-05-07 NOTE — ED Notes (Signed)
Pt decided to leave stated that she will come back in the morning.

## 2020-05-22 ENCOUNTER — Encounter: Payer: Self-pay | Admitting: Family Medicine

## 2020-05-22 ENCOUNTER — Other Ambulatory Visit: Payer: Self-pay | Admitting: Family Medicine

## 2020-05-22 ENCOUNTER — Other Ambulatory Visit: Payer: Self-pay

## 2020-05-22 ENCOUNTER — Ambulatory Visit: Payer: Self-pay | Attending: Family Medicine | Admitting: Family Medicine

## 2020-05-22 DIAGNOSIS — Z720 Tobacco use: Secondary | ICD-10-CM

## 2020-05-22 DIAGNOSIS — M5431 Sciatica, right side: Secondary | ICD-10-CM

## 2020-05-22 DIAGNOSIS — J438 Other emphysema: Secondary | ICD-10-CM

## 2020-05-22 DIAGNOSIS — H9201 Otalgia, right ear: Secondary | ICD-10-CM

## 2020-05-22 MED ORDER — DICLOFENAC SODIUM 75 MG PO TBEC: 75.0000 mg | DELAYED_RELEASE_TABLET | Freq: Two times a day (BID) | ORAL | 3 refills | Status: DC

## 2020-05-22 MED ORDER — CETIRIZINE HCL 10 MG PO TABS: 10.0000 mg | ORAL_TABLET | Freq: Every day | ORAL | 1 refills | Status: DC

## 2020-05-22 MED ORDER — BUPROPION HCL ER (XL) 150 MG PO TB24: 150.0000 mg | ORAL_TABLET | Freq: Every day | ORAL | 3 refills | Status: DC

## 2020-05-22 MED ORDER — SPIRIVA RESPIMAT 2.5 MCG/ACT IN AERS: 2.0000 | INHALATION_SPRAY | Freq: Every day | RESPIRATORY_TRACT | 3 refills | Status: DC

## 2020-05-22 MED ORDER — ALBUTEROL SULFATE HFA 108 (90 BASE) MCG/ACT IN AERS: 2.0000 | INHALATION_SPRAY | Freq: Four times a day (QID) | RESPIRATORY_TRACT | 3 refills | Status: DC | PRN

## 2020-05-22 MED FILL — ALBUTEROL SULFATE HFA 108 (: 108 (90 BAS | 25 days supply | Qty: 18 | Fill #0

## 2020-05-22 MED FILL — !SPIRIVA RESPIMAT INHAL SPR: 2.5 | 30 days supply | Qty: 4 | Fill #0

## 2020-05-22 MED FILL — DICLOFENAC SOD EC 75 MG TAB: 75 | 30 days supply | Qty: 60 | Fill #0

## 2020-05-22 MED FILL — BUPROPION HCL XL 150 MG TAB: 150 | 30 days supply | Qty: 30 | Fill #0
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
## 2020-05-22 NOTE — Progress Notes (Signed)
States that she wants to get hr ear checked  She has lump on her neck that comes and goes

## 2020-05-26 MED FILL — SPIRIVA RESPIMAT 2.5 MCG IN: 2.5 | 30 days supply | Qty: 4 | Fill #0

## 2020-06-29 ENCOUNTER — Emergency Department (HOSPITAL_COMMUNITY): Payer: 59

## 2020-06-29 ENCOUNTER — Encounter (HOSPITAL_COMMUNITY): Payer: Self-pay | Admitting: Emergency Medicine

## 2020-06-29 ENCOUNTER — Inpatient Hospital Stay (HOSPITAL_COMMUNITY)
Admission: EM | Admit: 2020-06-29 | Discharge: 2020-07-04 | DRG: 638 | Disposition: A | Discharge status: Home / Self Care | Payer: 59 | Attending: Internal Medicine | Admitting: Internal Medicine

## 2020-06-29 ENCOUNTER — Other Ambulatory Visit: Payer: Self-pay

## 2020-06-29 DIAGNOSIS — E139 Other specified diabetes mellitus without complications: Secondary | ICD-10-CM

## 2020-06-29 DIAGNOSIS — F17210 Nicotine dependence, cigarettes, uncomplicated: Secondary | ICD-10-CM | POA: Diagnosis present

## 2020-06-29 DIAGNOSIS — Z791 Long term (current) use of non-steroidal anti-inflammatories (NSAID): Secondary | ICD-10-CM

## 2020-06-29 DIAGNOSIS — R590 Localized enlarged lymph nodes: Secondary | ICD-10-CM | POA: Diagnosis not present

## 2020-06-29 DIAGNOSIS — E8881 Metabolic syndrome: Secondary | ICD-10-CM | POA: Diagnosis present

## 2020-06-29 DIAGNOSIS — Z9114 Patient's other noncompliance with medication regimen: Secondary | ICD-10-CM

## 2020-06-29 DIAGNOSIS — B373 Candidiasis of vulva and vagina: Secondary | ICD-10-CM | POA: Diagnosis present

## 2020-06-29 DIAGNOSIS — F10939 Alcohol use, unspecified with withdrawal, unspecified: Secondary | ICD-10-CM | POA: Diagnosis present

## 2020-06-29 DIAGNOSIS — R300 Dysuria: Secondary | ICD-10-CM | POA: Diagnosis not present

## 2020-06-29 DIAGNOSIS — J449 Chronic obstructive pulmonary disease, unspecified: Secondary | ICD-10-CM | POA: Diagnosis present

## 2020-06-29 DIAGNOSIS — M160 Bilateral primary osteoarthritis of hip: Secondary | ICD-10-CM

## 2020-06-29 DIAGNOSIS — Z20822 Contact with and (suspected) exposure to covid-19: Secondary | ICD-10-CM | POA: Diagnosis present

## 2020-06-29 DIAGNOSIS — M1990 Unspecified osteoarthritis, unspecified site: Secondary | ICD-10-CM | POA: Diagnosis present

## 2020-06-29 DIAGNOSIS — F10239 Alcohol dependence with withdrawal, unspecified: Secondary | ICD-10-CM | POA: Diagnosis present

## 2020-06-29 DIAGNOSIS — E1100 Type 2 diabetes mellitus with hyperosmolarity without nonketotic hyperglycemic-hyperosmolar coma (NKHHC): Secondary | ICD-10-CM | POA: Diagnosis present

## 2020-06-29 DIAGNOSIS — F1010 Alcohol abuse, uncomplicated: Secondary | ICD-10-CM | POA: Diagnosis not present

## 2020-06-29 DIAGNOSIS — R739 Hyperglycemia, unspecified: Secondary | ICD-10-CM

## 2020-06-29 DIAGNOSIS — R591 Generalized enlarged lymph nodes: Secondary | ICD-10-CM

## 2020-06-29 DIAGNOSIS — Z79899 Other long term (current) drug therapy: Secondary | ICD-10-CM | POA: Diagnosis not present

## 2020-06-29 DIAGNOSIS — E1110 Type 2 diabetes mellitus with ketoacidosis without coma: Secondary | ICD-10-CM

## 2020-06-29 DIAGNOSIS — K219 Gastro-esophageal reflux disease without esophagitis: Secondary | ICD-10-CM | POA: Diagnosis present

## 2020-06-29 DIAGNOSIS — E1165 Type 2 diabetes mellitus with hyperglycemia: Secondary | ICD-10-CM | POA: Diagnosis not present

## 2020-06-29 DIAGNOSIS — E041 Nontoxic single thyroid nodule: Secondary | ICD-10-CM

## 2020-06-29 DIAGNOSIS — N898 Other specified noninflammatory disorders of vagina: Secondary | ICD-10-CM | POA: Diagnosis not present

## 2020-06-29 LAB — URINALYSIS, ROUTINE W REFLEX MICROSCOPIC
Bacteria, UA: NONE SEEN
Bilirubin Urine: NEGATIVE
Glucose, UA: >=500 mg/dL — AB
Ketones, ur: 20 mg/dL — AB
Leukocytes,Ua: NEGATIVE
Nitrite: NEGATIVE
Protein, ur: NEGATIVE mg/dL
Specific Gravity, Urine: 1.03 (ref 1.005–1.030)
pH: 6 (ref 5.0–8.0)

## 2020-06-29 LAB — CBC
HCT: 41.4 % (ref 36.0–46.0)
HCT: 42.5 % (ref 36.0–46.0)
Hemoglobin: 13.8 g/dL (ref 12.0–15.0)
Hemoglobin: 14.9 g/dL (ref 12.0–15.0)
MCH: 32.6 pg (ref 26.0–34.0)
MCH: 32.7 pg (ref 26.0–34.0)
MCHC: 33.3 g/dL (ref 30.0–36.0)
MCHC: 35.1 g/dL (ref 30.0–36.0)
MCV: 97.9 fL (ref 80.0–100.0)
MCV: 93.4 fL (ref 80.0–100.0)
Platelets: 299 10*3/uL (ref 150–400)
Platelets: 301 10*3/uL (ref 150–400)
RBC: 4.23 MIL/uL (ref 3.87–5.11)
RBC: 4.55 MIL/uL (ref 3.87–5.11)
RDW: 11.9 % (ref 11.5–15.5)
RDW: 11.7 % (ref 11.5–15.5)
WBC: 6.5 10*3/uL (ref 4.0–10.5)
WBC: 7.4 10*3/uL (ref 4.0–10.5)
nRBC: 0 % (ref 0.0–0.2)
nRBC: 0 % (ref 0.0–0.2)

## 2020-06-29 LAB — BASIC METABOLIC PANEL
Anion gap: 16 — ABNORMAL HIGH (ref 5–15)
Anion gap: 14 (ref 5–15)
Anion gap: 12 (ref 5–15)
BUN: 9 mg/dL (ref 6–20)
BUN: 10 mg/dL (ref 6–20)
BUN: 5 mg/dL — ABNORMAL LOW (ref 6–20)
CO2: 18 mmol/L — ABNORMAL LOW (ref 22–32)
CO2: 21 mmol/L — ABNORMAL LOW (ref 22–32)
CO2: 23 mmol/L (ref 22–32)
Calcium: 8.5 mg/dL — ABNORMAL LOW (ref 8.9–10.3)
Calcium: 8.7 mg/dL — ABNORMAL LOW (ref 8.9–10.3)
Calcium: 9.3 mg/dL (ref 8.9–10.3)
Chloride: 91 mmol/L — ABNORMAL LOW (ref 98–111)
Chloride: 87 mmol/L — ABNORMAL LOW (ref 98–111)
Chloride: 101 mmol/L (ref 98–111)
Creatinine, Ser: 0.88 mg/dL (ref 0.44–1.00)
Creatinine, Ser: 0.95 mg/dL (ref 0.44–1.00)
Creatinine, Ser: 0.64 mg/dL (ref 0.44–1.00)
GFR calc Af Amer: >60 mL/min (ref >60)
GFR calc Af Amer: >60 mL/min (ref >60)
GFR calc Af Amer: >60 mL/min (ref >60)
GFR calc non Af Amer: >60 mL/min (ref >60)
GFR calc non Af Amer: >60 mL/min (ref >60)
GFR calc non Af Amer: >60 mL/min (ref >60)
Glucose, Bld: 894 mg/dL (ref 70–99)
Glucose, Bld: 907 mg/dL (ref 70–99)
Glucose, Bld: 286 mg/dL — ABNORMAL HIGH (ref 70–99)
Potassium: 3.7 mmol/L (ref 3.5–5.1)
Potassium: 4.2 mmol/L (ref 3.5–5.1)
Potassium: 3.5 mmol/L (ref 3.5–5.1)
Sodium: 125 mmol/L — ABNORMAL LOW (ref 135–145)
Sodium: 122 mmol/L — ABNORMAL LOW (ref 135–145)
Sodium: 136 mmol/L (ref 135–145)

## 2020-06-29 LAB — SARS CORONAVIRUS 2 BY RT PCR (HOSPITAL ORDER, PERFORMED IN ~~LOC~~ HOSPITAL LAB): SARS Coronavirus 2: NEGATIVE

## 2020-06-29 LAB — I-STAT VENOUS BLOOD GAS, ED
Acid-base deficit: 3 mmol/L — ABNORMAL HIGH (ref 0.0–2.0)
Bicarbonate: 23 mmol/L (ref 20.0–28.0)
Calcium, Ion: 1.02 mmol/L — ABNORMAL LOW (ref 1.15–1.40)
HCT: 48 % — ABNORMAL HIGH (ref 36.0–46.0)
Hemoglobin: 16.3 g/dL — ABNORMAL HIGH (ref 12.0–15.0)
O2 Saturation: 44 %
Potassium: 4.1 mmol/L (ref 3.5–5.1)
Sodium: 126 mmol/L — ABNORMAL LOW (ref 135–145)
TCO2: 24 mmol/L (ref 22–32)
pCO2, Ven: 41.6 mmHg — ABNORMAL LOW (ref 44.0–60.0)
pH, Ven: 7.351 (ref 7.250–7.430)
pO2, Ven: 26 mmHg — CL (ref 32.0–45.0)

## 2020-06-29 LAB — I-STAT BETA HCG BLOOD, ED (MC, WL, AP ONLY): I-stat hCG, quantitative: <5 m[IU]/mL (ref <5)

## 2020-06-29 LAB — OSMOLALITY: Osmolality: 318 mOsm/kg — ABNORMAL HIGH (ref 275–295)

## 2020-06-29 LAB — BETA-HYDROXYBUTYRIC ACID
Beta-Hydroxybutyric Acid: 1.4 mmol/L — ABNORMAL HIGH (ref 0.05–0.27)
Beta-Hydroxybutyric Acid: 0.89 mmol/L — ABNORMAL HIGH (ref 0.05–0.27)

## 2020-06-29 LAB — CBG MONITORING, ED
Glucose-Capillary: >600 mg/dL (ref 70–99)
Glucose-Capillary: >600 mg/dL (ref 70–99)
Glucose-Capillary: 402 mg/dL — ABNORMAL HIGH (ref 70–99)
Glucose-Capillary: 339 mg/dL — ABNORMAL HIGH (ref 70–99)
Glucose-Capillary: 294 mg/dL — ABNORMAL HIGH (ref 70–99)

## 2020-06-29 MED ORDER — DEXTROSE IN LACTATED RINGERS 5 % IV SOLN: INTRAVENOUS | Status: DC

## 2020-06-29 MED ORDER — LACTATED RINGERS IV SOLN: INTRAVENOUS | Status: DC

## 2020-06-29 MED ORDER — UMECLIDINIUM BROMIDE 62.5 MCG/INH IN AEPB
1.0000 | INHALATION_SPRAY | Freq: Every day | RESPIRATORY_TRACT | Status: DC
  Administered 2020-07-01 – 2020-07-04 (×3): 1 via RESPIRATORY_TRACT
  Filled 2020-06-29: qty 7

## 2020-06-29 MED ORDER — THIAMINE HCL 100 MG/ML IJ SOLN
100.0000 mg | Freq: Every day | INTRAMUSCULAR | Status: DC
  Filled 2020-06-29 (×2): qty 2

## 2020-06-29 MED ORDER — SODIUM CHLORIDE 0.9% FLUSH
3.0000 mL | Freq: Once | INTRAVENOUS | Status: AC
  Administered 2020-06-29: 3 mL via INTRAVENOUS

## 2020-06-29 MED ORDER — INSULIN REGULAR(HUMAN) IN NACL 100-0.9 UT/100ML-% IV SOLN
INTRAVENOUS | Status: AC
  Administered 2020-06-29: 6.5 [IU]/h via INTRAVENOUS
  Administered 2020-06-30: 11 [IU]/h via INTRAVENOUS
  Filled 2020-06-29 (×2): qty 100

## 2020-06-29 MED ORDER — BUPROPION HCL ER (XL) 150 MG PO TB24
150.0000 mg | ORAL_TABLET | Freq: Every day | ORAL | Status: DC
  Administered 2020-06-29 – 2020-07-03 (×5): 150 mg via ORAL
  Filled 2020-06-29 (×5): qty 1

## 2020-06-29 MED ORDER — INSULIN REGULAR(HUMAN) IN NACL 100-0.9 UT/100ML-% IV SOLN: INTRAVENOUS | Status: DC

## 2020-06-29 MED ORDER — ALBUTEROL SULFATE HFA 108 (90 BASE) MCG/ACT IN AERS: 2.0000 | INHALATION_SPRAY | Freq: Four times a day (QID) | RESPIRATORY_TRACT | Status: DC | PRN

## 2020-06-29 MED ORDER — LACTATED RINGERS IV BOLUS
20.0000 mL/kg | Freq: Once | INTRAVENOUS | Status: AC
  Administered 2020-06-29: 1750 mL via INTRAVENOUS

## 2020-06-29 MED ORDER — DEXTROSE 50 % IV SOLN: 0.0000 mL | INTRAVENOUS | Status: DC | PRN

## 2020-06-29 MED ORDER — POTASSIUM CHLORIDE 10 MEQ/100ML IV SOLN
10.0000 meq | INTRAVENOUS | Status: AC
  Administered 2020-06-29: 10 meq via INTRAVENOUS
  Filled 2020-06-29: qty 100

## 2020-06-29 MED ORDER — ADULT MULTIVITAMIN W/MINERALS CH
1.0000 | ORAL_TABLET | Freq: Every day | ORAL | Status: DC
  Administered 2020-06-29 – 2020-07-04 (×6): 1 via ORAL
  Filled 2020-06-29 (×6): qty 1

## 2020-06-29 MED ORDER — ENOXAPARIN SODIUM 40 MG/0.4ML ~~LOC~~ SOLN
40.0000 mg | SUBCUTANEOUS | Status: DC
  Administered 2020-06-29 – 2020-07-03 (×5): 40 mg via SUBCUTANEOUS
  Filled 2020-06-29 (×5): qty 0.4

## 2020-06-29 MED ORDER — FOLIC ACID 1 MG PO TABS
1.0000 mg | ORAL_TABLET | Freq: Every day | ORAL | Status: DC
  Administered 2020-06-29 – 2020-07-04 (×6): 1 mg via ORAL
  Filled 2020-06-29 (×6): qty 1

## 2020-06-29 MED ORDER — THIAMINE HCL 100 MG PO TABS
100.0000 mg | ORAL_TABLET | Freq: Every day | ORAL | Status: DC
  Administered 2020-06-29 – 2020-07-04 (×6): 100 mg via ORAL
  Filled 2020-06-29 (×6): qty 1
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
## 2020-06-29 NOTE — ED Notes (Signed)
Pt in waiting room.  Glucose 894. Dr. Effie Shy notified.

## 2020-06-29 NOTE — Hospital Course (Addendum)
Ms.Golla was examined and evaluated at bedside in ED. She mentions over the last 2 weeks she had been having dizziness, weakness, loss of appetite and dry mouth. Over the last 3 days, her dizziness progressively worsened so she came to the ED for evaluation. She describes the dizziness as 'room spinning' worsened with ambulation and alleviated by rest. She denies syncopal episode but does mention feeling light-headed as well as having intermittent headaches.   She mentions trying to make an appointment with her PCP but felt she had to come to ED due to worsening of her symptoms. She mentions that when she is walking, she noticing worsening gait with 'not walking straight' on ambulation. She also mentions worsening vision but states this is progressive and chronic. She states she has been having difficulty with obtaining adequate medical coverage. She also mentions not taking her home medications. She mentions being prescribed medicine for smoking use and arthritis.  On review of system, mentions some weakness and joint pain due to arthritis of her Right leg as well as endorsing some abdominal cramps. She mentions having loss of appetite and nausea but no vomiting. Denies any fevers, chills, diarrhea. Denies any chest pain, palpitations, dyspnea. Also mentions urinary frequency but denies any dysuria or urgency.  Family History Mentions mother passed from alcoholic cirrhosis. Father may have passed from heart attack. No other clinically relevant family history.  Social History Currently staying with her daughter but does not have her own housing. Often lives between shelter or friend's homes. 2~3 40oz daily alcohol use. Last alcohol use yesterday. Denies prior withdrawal symptoms. Smokes 10-15 cigarettes daily. Mentions previous cocaine use but has not used in 8 months.  ****************************************************************** F/u podiatry F/u ophtho Appointment with [place] Late night salivary  cortisol
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
## 2020-06-29 NOTE — ED Triage Notes (Signed)
Reports generalized weakness and dizziness x 2 weeks.  No arm drift.

## 2020-06-30 ENCOUNTER — Inpatient Hospital Stay (HOSPITAL_COMMUNITY): Payer: 59

## 2020-06-30 ENCOUNTER — Encounter (HOSPITAL_COMMUNITY): Payer: Self-pay | Admitting: Internal Medicine

## 2020-06-30 DIAGNOSIS — E1100 Type 2 diabetes mellitus with hyperosmolarity without nonketotic hyperglycemic-hyperosmolar coma (NKHHC): Principal | ICD-10-CM

## 2020-06-30 DIAGNOSIS — F1010 Alcohol abuse, uncomplicated: Secondary | ICD-10-CM

## 2020-06-30 DIAGNOSIS — R590 Localized enlarged lymph nodes: Secondary | ICD-10-CM

## 2020-06-30 DIAGNOSIS — M1990 Unspecified osteoarthritis, unspecified site: Secondary | ICD-10-CM | POA: Diagnosis present

## 2020-06-30 DIAGNOSIS — E1165 Type 2 diabetes mellitus with hyperglycemia: Secondary | ICD-10-CM

## 2020-06-30 LAB — BASIC METABOLIC PANEL
Anion gap: 12 (ref 5–15)
Anion gap: 11 (ref 5–15)
Anion gap: 7 (ref 5–15)
BUN: 5 mg/dL — ABNORMAL LOW (ref 6–20)
BUN: 5 mg/dL — ABNORMAL LOW (ref 6–20)
BUN: <5 mg/dL — ABNORMAL LOW (ref 6–20)
CO2: 23 mmol/L (ref 22–32)
CO2: 23 mmol/L (ref 22–32)
CO2: 23 mmol/L (ref 22–32)
Calcium: 9 mg/dL (ref 8.9–10.3)
Calcium: 8.7 mg/dL — ABNORMAL LOW (ref 8.9–10.3)
Calcium: 8.5 mg/dL — ABNORMAL LOW (ref 8.9–10.3)
Chloride: 102 mmol/L (ref 98–111)
Chloride: 102 mmol/L (ref 98–111)
Chloride: 105 mmol/L (ref 98–111)
Creatinine, Ser: 0.59 mg/dL (ref 0.44–1.00)
Creatinine, Ser: 0.53 mg/dL (ref 0.44–1.00)
Creatinine, Ser: 0.55 mg/dL (ref 0.44–1.00)
GFR calc Af Amer: >60 mL/min (ref >60)
GFR calc Af Amer: >60 mL/min (ref >60)
GFR calc Af Amer: >60 mL/min (ref >60)
GFR calc non Af Amer: >60 mL/min (ref >60)
GFR calc non Af Amer: >60 mL/min (ref >60)
GFR calc non Af Amer: >60 mL/min (ref >60)
Glucose, Bld: 238 mg/dL — ABNORMAL HIGH (ref 70–99)
Glucose, Bld: 179 mg/dL — ABNORMAL HIGH (ref 70–99)
Glucose, Bld: 276 mg/dL — ABNORMAL HIGH (ref 70–99)
Potassium: 3 mmol/L — ABNORMAL LOW (ref 3.5–5.1)
Potassium: 3.3 mmol/L — ABNORMAL LOW (ref 3.5–5.1)
Potassium: 3.9 mmol/L (ref 3.5–5.1)
Sodium: 137 mmol/L (ref 135–145)
Sodium: 136 mmol/L (ref 135–145)
Sodium: 135 mmol/L (ref 135–145)

## 2020-06-30 LAB — COMPREHENSIVE METABOLIC PANEL
ALT: 32 U/L (ref 0–44)
AST: 30 U/L (ref 15–41)
Albumin: 4.2 g/dL (ref 3.5–5.0)
Alkaline Phosphatase: 81 U/L (ref 38–126)
Anion gap: 14 (ref 5–15)
BUN: 5 mg/dL — ABNORMAL LOW (ref 6–20)
CO2: 22 mmol/L (ref 22–32)
Calcium: 9.6 mg/dL (ref 8.9–10.3)
Chloride: 100 mmol/L (ref 98–111)
Creatinine, Ser: 0.64 mg/dL (ref 0.44–1.00)
GFR calc Af Amer: >60 mL/min (ref >60)
GFR calc non Af Amer: >60 mL/min (ref >60)
Glucose, Bld: 289 mg/dL — ABNORMAL HIGH (ref 70–99)
Potassium: 3.7 mmol/L (ref 3.5–5.1)
Sodium: 136 mmol/L (ref 135–145)
Total Bilirubin: 0.8 mg/dL (ref 0.3–1.2)
Total Protein: 7.5 g/dL (ref 6.5–8.1)

## 2020-06-30 LAB — HIV ANTIBODY (ROUTINE TESTING W REFLEX): HIV Screen 4th Generation wRfx: NONREACTIVE

## 2020-06-30 LAB — BETA-HYDROXYBUTYRIC ACID: Beta-Hydroxybutyric Acid: 0.52 mmol/L — ABNORMAL HIGH (ref 0.05–0.27)

## 2020-06-30 LAB — CBG MONITORING, ED
Glucose-Capillary: 121 mg/dL — ABNORMAL HIGH (ref 70–99)
Glucose-Capillary: 172 mg/dL — ABNORMAL HIGH (ref 70–99)
Glucose-Capillary: 186 mg/dL — ABNORMAL HIGH (ref 70–99)
Glucose-Capillary: 190 mg/dL — ABNORMAL HIGH (ref 70–99)
Glucose-Capillary: 200 mg/dL — ABNORMAL HIGH (ref 70–99)
Glucose-Capillary: 207 mg/dL — ABNORMAL HIGH (ref 70–99)
Glucose-Capillary: 215 mg/dL — ABNORMAL HIGH (ref 70–99)
Glucose-Capillary: 216 mg/dL — ABNORMAL HIGH (ref 70–99)
Glucose-Capillary: 237 mg/dL — ABNORMAL HIGH (ref 70–99)
Glucose-Capillary: 239 mg/dL — ABNORMAL HIGH (ref 70–99)
Glucose-Capillary: 251 mg/dL — ABNORMAL HIGH (ref 70–99)
Glucose-Capillary: 311 mg/dL — ABNORMAL HIGH (ref 70–99)

## 2020-06-30 LAB — PHOSPHORUS: Phosphorus: 3.3 mg/dL (ref 2.5–4.6)

## 2020-06-30 LAB — TSH: TSH: 4.01 u[IU]/mL (ref 0.350–4.500)

## 2020-06-30 LAB — FERRITIN: Ferritin: 312 ng/mL — ABNORMAL HIGH (ref 11–307)

## 2020-06-30 LAB — MAGNESIUM: Magnesium: 2.5 mg/dL — ABNORMAL HIGH (ref 1.7–2.4)

## 2020-06-30 MED ORDER — INSULIN ASPART 100 UNIT/ML ~~LOC~~ SOLN
0.0000 [IU] | Freq: Every day | SUBCUTANEOUS | Status: DC
  Administered 2020-07-01: 3 [IU] via SUBCUTANEOUS
  Administered 2020-07-02: 4 [IU] via SUBCUTANEOUS
  Administered 2020-07-03: 5 [IU] via SUBCUTANEOUS

## 2020-06-30 MED ORDER — LIVING WELL WITH DIABETES BOOK
Freq: Once | Status: AC
  Filled 2020-06-30 (×2): qty 1

## 2020-06-30 MED ORDER — POTASSIUM CHLORIDE 10 MEQ/100ML IV SOLN
10.0000 meq | INTRAVENOUS | Status: DC
  Administered 2020-06-30: 10 meq via INTRAVENOUS
  Filled 2020-06-30: qty 100

## 2020-06-30 MED ORDER — INSULIN GLARGINE 100 UNIT/ML ~~LOC~~ SOLN
10.0000 [IU] | Freq: Once | SUBCUTANEOUS | Status: AC
  Administered 2020-06-30: 10 [IU] via SUBCUTANEOUS
  Filled 2020-06-30: qty 0.1

## 2020-06-30 MED ORDER — POTASSIUM CHLORIDE CRYS ER 20 MEQ PO TBCR: 40.0000 meq | EXTENDED_RELEASE_TABLET | ORAL | Status: DC

## 2020-06-30 MED ORDER — POTASSIUM CHLORIDE CRYS ER 20 MEQ PO TBCR
40.0000 meq | EXTENDED_RELEASE_TABLET | Freq: Once | ORAL | Status: AC
  Administered 2020-06-30: 40 meq via ORAL
  Filled 2020-06-30: qty 2

## 2020-06-30 MED ORDER — IOHEXOL 300 MG/ML  SOLN
100.0000 mL | Freq: Once | INTRAMUSCULAR | Status: AC | PRN
  Administered 2020-06-30: 100 mL via INTRAVENOUS

## 2020-06-30 MED ORDER — FLUCONAZOLE 150 MG PO TABS
150.0000 mg | ORAL_TABLET | Freq: Once | ORAL | Status: AC
  Administered 2020-06-30: 150 mg via ORAL
  Filled 2020-06-30: qty 1

## 2020-06-30 MED ORDER — INSULIN ASPART 100 UNIT/ML ~~LOC~~ SOLN
0.0000 [IU] | Freq: Three times a day (TID) | SUBCUTANEOUS | Status: DC
  Administered 2020-06-30: 2 [IU] via SUBCUTANEOUS
  Administered 2020-07-01: 8 [IU] via SUBCUTANEOUS
  Administered 2020-07-01: 15 [IU] via SUBCUTANEOUS
  Administered 2020-07-01: 11 [IU] via SUBCUTANEOUS
  Administered 2020-07-02: 5 [IU] via SUBCUTANEOUS
  Administered 2020-07-02 – 2020-07-03 (×4): 11 [IU] via SUBCUTANEOUS
  Administered 2020-07-03: 3 [IU] via SUBCUTANEOUS
  Administered 2020-07-04: 8 [IU] via SUBCUTANEOUS
  Administered 2020-07-04: 3 [IU] via SUBCUTANEOUS

## 2020-06-30 MED ORDER — POTASSIUM CHLORIDE 10 MEQ/100ML IV SOLN
10.0000 meq | INTRAVENOUS | Status: AC
  Administered 2020-06-30 (×5): 10 meq via INTRAVENOUS
  Filled 2020-06-30 (×4): qty 100

## 2020-06-30 MED ORDER — INSULIN GLARGINE 100 UNIT/ML ~~LOC~~ SOLN
20.0000 [IU] | Freq: Every day | SUBCUTANEOUS | Status: DC
  Administered 2020-07-01: 20 [IU] via SUBCUTANEOUS
  Filled 2020-06-30 (×3): qty 0.2

## 2020-06-30 NOTE — ED Notes (Signed)
Pt's CBG result was 172. Informed Steward Drone - RN.

## 2020-06-30 NOTE — Discharge Instructions (Signed)
Glucose Products:  ReliOnT glucose products raise low blood sugar fast. Tablets are free of fat, caffeine, sodium and gluten. They are portable and easy to carry, making it easier for people with diabetes to BE PREPARED for lows.  Glucose Tablets Available in 6 flavors . 10 ct...................................... $1.00 . 50 ct...................................... $3.98 Glucose Shot..................................$1.48 Glucose Gel....................................$3.44  Alcohol Swabs Alcohol swabs are used to sterilize your injection site. All of our swabs are individually wrapped for maximum safety, convenience and moisture retention. ReliOnT Alcohol Swabs . 100 ct Swabs..............................$1.00 . 400 ct Swabs..............................$3.74  Lancets ReliOnT offers three lancet options conveniently designed to work with almost every lancing device. Each features a protective disk, which guarantees sterility before testing. ReliOnT Lancets . 100 ct Lancets $1.56 . 200 ct Lancets $2.64 Available in Ultra-Thin, Thin & Micro-Thin ReliOnT 2-IN-1 Lancing Device . 50 ct Lancets..................................... $3.44 Available in 30 gauge and 25 gauge ReliOnT Lancing Device....................$5.84  Blood Glucose Monitors ReliOnT offers a full range of blood glucose testing options to provide an accurate, affordable system that meets each person's unique needs and preferences. Prime Meter....................................... $9.00 Prime Test Strips . 25 test strips.................................... $5.00 . 50 test strips.................................... $9.00 . 100 test strips.................................$17.88 Premier BLU Meter  ............  $18.98 Premier Voice Meter  .............  $14.98 Premier Test Strips . 50 test strips.................................... $9.00 . 100 test strips.................................$17.88 Premier [employer]   ............  $19.44 Kit includes: . 50 test strips . 10 lancets . Lancing device . Carry case  Ketone Test Strips . 50 test strips  ................  $6.64  Human Insulin  Novolin/ReliOnT (recombinant DNA origin) is manufactured for [employer] by [employer] Insulin* with Vial..........$24.88 Available in N, R, 70/30 Novolin/ReliOnT Insulin Pens*  .....  $42.88 Available in N, R, 70/30  Insulin Delivery ReliOnT syringes and pen needles provide precision technology, comfort and accuracy in insulin delivery at affordable prices. ReliOnT Pen Needles* . 50 ct....................................................$9.00 Available in 53m, 667m 63m20m 71m72mliOnT Insulin Syringes* . 100 ct ............ $12.58 Available in 29G, 30G & 31G (3/10cc, 1/2cc & 1cc units)

## 2020-06-30 NOTE — Progress Notes (Signed)
CSW received consult for patient for assistance applying for Medicaid - CSW contacted [place] of financial counseling to request she assist this patient.  Edwin Dada, MSW, LCSW-A Transitions of Care  Clinical Social Worker  [facility] Emergency Departments  Medical ICU [phone]

## 2020-06-30 NOTE — Progress Notes (Signed)
Subjective:   Patient presented with a few weeks of dizziness, generalized weakness, polyuria, polydipsia. No know hx of DM, blood sugar of 900 on presentation. Blood sugar and symptoms improved now with insulin drip. Had a recent illness with cough, congestion, subjective fever. No known autoimmune diseases, No known FHx of diabetes or autoimmune diseases, no recent medication changes. She is has been postmenopausal for several years.   She also notes lumps under her jaw and around her neck. She does not feel any along her breasts. She says she has never had a mammogram. She does endorse diffuse pain about her chest region.   She reports drinking 2-3 40oz bottle of liquor daily. Denies any withdrawal symptoms and is not concerned that she will withdraw. Discussed CIWA protocol with her and she that she will receive Ativan only if needed, she is agreeable.  Objective:  Vital signs in last 24 hours: Vitals:   06/29/20 2245 06/30/20 0244 06/30/20 0400 06/30/20 0600  BP: 132/84 (!) 144/68 131/82 102/68  Pulse: 69 62 62 61  Resp: 15 16 18 17   Temp:      TempSrc:      SpO2: 93% 94% 95% 93%  Weight:      Height:        Physical Exam Constitutional: no acute distress Head: atraumatic ENT: external ears normal, supraclavicular and anterior cervical lymphadenopathy present, no axillary lymphadenopathy Eyes: EOMI Cardiovascular: regular rate and rhythm, normal heart sounds Pulmonary: effort normal, normal breath sounds bilaterally Abdominal: flat, nontender, no rebound tenderness, bowel sounds normal Skin: warm and dry Neurological: alert, no focal deficit Psychiatric: normal mood and affect   Assessment/Plan: [name] is a 54 y/o F with medical history of osteoarthritis, COPD, GERD, and alcohol use disorder presenting with HHS, no prior DM diagnosis. Also with supraclavicular lymphadenopathy, no prior mammogram.  Active Problems:   Alcohol withdrawal (HCC)   COPD (chronic  obstructive pulmonary disease) (HCC)   Hyperosmolar hyperglycemic state (HHS) (HCC)   Osteoarthritis  HHS Diabetes, type II vs MODY Patient with no history of diabetes presented with generalized weakness and dizziness. Found to have blood sugar of 894 on admission accompanied by elevated serum osmolality and beta hydroxybutyrate with mild anion gap of 16, not acidotic. Started on IV insulin and fluids with improvements in BS. Transitioned to subcutaneous insulin and eating.  -discontinue insulin in 2 hours after subcu insulin started -discontinue fluids -thiamine 100mg  PO -f/u A1c -diabetes educator consulted -f/u islet cell Ab, insulin Ab, GAD Ab. Normal TSH -outpatient collect C-peptide and insulin level -TOC consult for medication assistance  Supraclavicular lymphadenopathy, no mammogram Has noticed lymphadenopathy over the past few weeks. Denies any breast lumps. Has never had a mammogram. Hx of smoking. -f/u CT scan of chest and abdomen.  -f/u in our clinic  Alcohol use disorder Patient reports drinking 2-3 40 ounces bottle of liquor a day. Her last drink was yesterday and she has no history of alcohol withdraw. -Folic acid 1 mg daily -thiamine 100mg  PO -CIWA protocol  COPD Denies SOB or cough. No wheezing on exam.  --Albuterol inhaler q6h prn for wheezing --Tiotropium Bromide 1 puffs daily  Osteoarthritis Patient has osteoarthritis of both hips, right worse than left. -Tylenol as needed for pain  Cigarette Use Patient reports smoking about 15 cigarettes/day for many years. Currently working on smoking cessation with medication. -continue home Bupropion 150 mg p.o. daily  Diet: carb modified IVF:  n/a VTE:  lovenox Prior to Admission Living Arrangement:  home Anticipated Discharge Location:  home Barriers to Discharge:  HHS, DM workup Dispo: Anticipated discharge in approximately 1 day(s).   Remo Lipps, MD 06/30/2020, 2:35 PM Pager: [phone] After  5pm on weekdays and 1pm on weekends: On Call pager [phone]

## 2020-06-30 NOTE — ED Notes (Signed)
Requested bed be changed from progressive to tele.

## 2020-06-30 NOTE — Progress Notes (Addendum)
Inpatient Diabetes Program Recommendations  AACE/ADA: New Consensus Statement on Inpatient Glycemic Control (2015)  Target Ranges:  Prepandial:   less than 140 mg/dL      Peak postprandial:   less than 180 mg/dL (1-2 hours)      Critically ill patients:  140 - 180 mg/dL   Lab Results  Component Value Date   GLUCAP 190 (H) 06/30/2020    Review of Glycemic Control  Diabetes history: New diagnosis  Current orders for Inpatient glycemic control:  IV insulin/Endotool  Inpatient Diabetes Program Recommendations:    IV insulin gtt rate 4-5.5 units/hour.  - Consider Lantus 30 units - Novolog 0-15 units tid + hs - Novolog 5 units tid meal coverage if eating >50% of meals  Note: New diagnosis this admission. Will order living well with Diabetes educational booklet. Will need follow up where she can afford insulin [facility] price $10, [facility] $25/vial or $43/box of 5 insulin pens not including needles/syringes. Pt will need glucose meter from [employer] over the counter if not able to get one through clinic for cheaper.  Will speak with pt today.  Addendum 3:36 pm: Spoke with patient about new diabetes diagnosis.  Discussed glucose levels and we are waiting for A1c level. Discussed glucose and A1c goals. Discussed basic pathophysiology of DM Type 2, basic home care, importance of checking CBGs and maintaining good CBG control to prevent long-term and short-term complications.  Reviewed signs and symptoms of hyperglycemia and hypoglycemia along with treatment for both. Discussed impact of nutrition, exercise, stress, sickness, and medications on diabetes control. Reviewed Living Well with diabetes booklet and encouraged patient to read through entire book. Informed patient that she will be prescribed insulin. Discussed 70/30 insulin as this maybe a good option based on glucose trends increasing into the 300 range after meal intake of 45 grams of carbs at lunchtime  today.  Provided patient with handout information on [employer] products and encouraged patient to go to [facility] to glucose meter and supplies.  Discussed 70/30 insulin in detail (how to take it, when to take it). Asked patient to check her glucose 2-3 times per day (before meals and at bedtime) and to keep a log book of glucose readings and insulin taken. Explained how the doctor he follows up with can use the log book to continue to make insulin adjustments if needed.   Pt has given insulin to other individuals in the past via vial and syringe. Showed pt insulin pen delivery of insulin with return demonstration.   Patient verbalized understanding of information discussed and has no further questions at this time related to diabetes.   RNs to provide ongoing basic DM education at bedside with this patient and engage patient to actively check blood glucose and administer insulin injections.    Thanks, Christena Deem RN, MSN, BC-ADM Inpatient Diabetes Coordinator Team Pager [phone] (8a-5p)

## 2020-06-30 NOTE — ED Notes (Signed)
Pt given ER bagged lunch and Diet Sprite, per Steward Drone - RN.

## 2020-07-01 ENCOUNTER — Telehealth: Payer: Self-pay | Admitting: Family Medicine

## 2020-07-01 DIAGNOSIS — N898 Other specified noninflammatory disorders of vagina: Secondary | ICD-10-CM

## 2020-07-01 DIAGNOSIS — R300 Dysuria: Secondary | ICD-10-CM

## 2020-07-01 LAB — BASIC METABOLIC PANEL
Anion gap: 10 (ref 5–15)
BUN: 8 mg/dL (ref 6–20)
CO2: 22 mmol/L (ref 22–32)
Calcium: 9 mg/dL (ref 8.9–10.3)
Chloride: 103 mmol/L (ref 98–111)
Creatinine, Ser: 0.74 mg/dL (ref 0.44–1.00)
GFR calc Af Amer: >60 mL/min (ref >60)
GFR calc non Af Amer: >60 mL/min (ref >60)
Glucose, Bld: 433 mg/dL — ABNORMAL HIGH (ref 70–99)
Potassium: 4.1 mmol/L (ref 3.5–5.1)
Sodium: 135 mmol/L (ref 135–145)

## 2020-07-01 LAB — URINALYSIS, ROUTINE W REFLEX MICROSCOPIC
Bacteria, UA: NONE SEEN
Bilirubin Urine: NEGATIVE
Glucose, UA: >=500 mg/dL — AB
Hgb urine dipstick: NEGATIVE
Ketones, ur: NEGATIVE mg/dL
Leukocytes,Ua: NEGATIVE
Nitrite: NEGATIVE
Protein, ur: NEGATIVE mg/dL
Specific Gravity, Urine: 1.016 (ref 1.005–1.030)
pH: 6 (ref 5.0–8.0)

## 2020-07-01 LAB — GLUCOSE, CAPILLARY
Glucose-Capillary: 405 mg/dL — ABNORMAL HIGH (ref 70–99)
Glucose-Capillary: 346 mg/dL — ABNORMAL HIGH (ref 70–99)
Glucose-Capillary: 288 mg/dL — ABNORMAL HIGH (ref 70–99)
Glucose-Capillary: 281 mg/dL — ABNORMAL HIGH (ref 70–99)

## 2020-07-01 LAB — CBC
HCT: 38.5 % (ref 36.0–46.0)
Hemoglobin: 13.3 g/dL (ref 12.0–15.0)
MCH: 33.3 pg (ref 26.0–34.0)
MCHC: 34.5 g/dL (ref 30.0–36.0)
MCV: 96.5 fL (ref 80.0–100.0)
Platelets: 275 10*3/uL (ref 150–400)
RBC: 3.99 MIL/uL (ref 3.87–5.11)
RDW: 12 % (ref 11.5–15.5)
WBC: 6 10*3/uL (ref 4.0–10.5)
nRBC: 0 % (ref 0.0–0.2)

## 2020-07-01 LAB — HEMOGLOBIN A1C
Hgb A1c MFr Bld: 10.7 % — ABNORMAL HIGH (ref 4.8–5.6)
Mean Plasma Glucose: 260 mg/dL

## 2020-07-01 LAB — ANTI-ISLET CELL ANTIBODY: Pancreatic Islet Cell Antibody: NEGATIVE

## 2020-07-01 MED ORDER — INSULIN GLARGINE 100 UNIT/ML ~~LOC~~ SOLN
30.0000 [IU] | Freq: Every day | SUBCUTANEOUS | Status: DC
  Filled 2020-07-01: qty 0.3

## 2020-07-01 MED ORDER — INSULIN ASPART 100 UNIT/ML ~~LOC~~ SOLN
3.0000 [IU] | Freq: Three times a day (TID) | SUBCUTANEOUS | Status: DC
  Administered 2020-07-01: 3 [IU] via SUBCUTANEOUS

## 2020-07-01 MED ORDER — INSULIN GLARGINE 100 UNIT/ML ~~LOC~~ SOLN
40.0000 [IU] | Freq: Every day | SUBCUTANEOUS | Status: DC
  Filled 2020-07-01: qty 0.4

## 2020-07-01 MED ORDER — INSULIN ASPART 100 UNIT/ML ~~LOC~~ SOLN
5.0000 [IU] | Freq: Three times a day (TID) | SUBCUTANEOUS | Status: DC
  Administered 2020-07-01: 5 [IU] via SUBCUTANEOUS

## 2020-07-01 MED ORDER — METFORMIN HCL ER 500 MG PO TB24: 500.0000 mg | ORAL_TABLET | Freq: Every day | ORAL | Status: DC

## 2020-07-01 MED ORDER — INSULIN GLARGINE 100 UNIT/ML ~~LOC~~ SOLN
40.0000 [IU] | Freq: Every day | SUBCUTANEOUS | Status: DC
  Administered 2020-07-01: 40 [IU] via SUBCUTANEOUS
  Filled 2020-07-01 (×2): qty 0.4

## 2020-07-01 MED ORDER — CLOTRIMAZOLE 1 % EX CREA
TOPICAL_CREAM | Freq: Two times a day (BID) | CUTANEOUS | Status: DC
  Filled 2020-07-01: qty 15

## 2020-07-01 MED ORDER — METFORMIN HCL ER 500 MG PO TB24
500.0000 mg | ORAL_TABLET | Freq: Every day | ORAL | Status: DC
  Administered 2020-07-01 – 2020-07-04 (×4): 500 mg via ORAL
  Filled 2020-07-01 (×4): qty 1

## 2020-07-01 NOTE — Progress Notes (Addendum)
Inpatient Diabetes Program Recommendations  AACE/ADA: New Consensus Statement on Inpatient Glycemic Control (2015)  Target Ranges:  Prepandial:   less than 140 mg/dL      Peak postprandial:   less than 180 mg/dL (1-2 hours)      Critically ill patients:  140 - 180 mg/dL   Lab Results  Component Value Date   GLUCAP 346 (H) 07/01/2020   HGBA1C 10.7 (H) 06/29/2020    Review of Glycemic Control  Diabetes history: New diagnosis  Current orders for Inpatient glycemic control:  Lantus 30 units Novolog 0-15 units tid + hs  Novolog 3 units tid meal coverage  Inpatient Diabetes Program Recommendations:    Glucose 405 after Lantus 30 units given yesterday  - Consider Increasing Lantus to 45-50 units  - Novolog 5 units tid meal coverage if eating >50% of meals    Based on basal insulin needs 70/30 35 units bid   Note: Follow up [facility]. Pt will need glucose meter. Ponder to see which insulins they have on hand to order for d/c for MD to order.  Pt showing up with [employer] insurance today. Still able to get her meds through [facility] pharmacy but they run it through insurance instead of their pricing.   Novolog 70/30 Order # [id number] $25/30 day supply Insulin pen needles order # [id number] Glucose meter kit order # [id number]   They do have Lantus and Novolog/Humalog at the pharmacy but it will be cheaper to convert pt to 70/30 dosing.   Thanks, Tama Headings RN, MSN, BC-ADM Inpatient Diabetes Coordinator Team Pager [phone] (8a-5p)

## 2020-07-01 NOTE — TOC Initial Note (Addendum)
Transition of Care [facility]) - Initial/Assessment Note    Patient Details  Name: Peggy Warren MRN: [id number] Date of Birth: 16-Sep-1966  Transition of Care [facility]) CM/SW Contact:    Bess Kinds, RN Phone Number: [phone] 07/01/2020, 3:57 PM  Clinical Narrative:                  Acknowledging TOC consult to schedule PCP appointment. Patient is active with [facility]. Hospital follow up appointment scheduled for first available 10/19 2:30 pm - message sent to PCP by scheduler to advise of new diabetes diagnosis.   Benefits check requested for insulin.   TOC following for transition needs.   Expected Discharge Plan: Home/Self Care Barriers to Discharge: Continued Medical Work up   Patient Goals and CMS Choice        Expected Discharge Plan and Services Expected Discharge Plan: Home/Self Care                                              Prior Living Arrangements/Services                       Activities of Daily Living      Permission Sought/Granted                  Emotional Assessment              Admission diagnosis:  DKA (diabetic ketoacidoses) (HCC) [E11.10] Hyperglycemia [R73.9] Lymphadenopathy of left cervical region [R59.0] Hyperosmolar hyperglycemic state (HHS) (HCC) [E11.00, E11.65] Patient Active Problem List   Diagnosis Date Noted  . Osteoarthritis 06/30/2020  . Hyperosmolar hyperglycemic state (HHS) (HCC) 06/29/2020  . COPD (chronic obstructive pulmonary disease) (HCC)   . Alcohol dependence (HCC) 07/23/2013  . Alcohol withdrawal (HCC) 07/23/2013  . Cocaine abuse with cocaine-induced mood disorder (HCC) 07/23/2013  . Substance induced mood disorder (HCC) 07/23/2013   PCP:  Hoy Register, MD Pharmacy:   [facility] [place], [place] [address] [place] [address] Phone: [phone] Fax: [phone]     Social Determinants of Health (SDOH) Interventions     Readmission Risk Interventions No flowsheet data found.

## 2020-07-01 NOTE — Progress Notes (Addendum)
   Subjective:   Patient feels well this morning. States that he dizziness has resolved. Notes continued vaginal itching and dysuria. Initial UA unremarkable. 1 dose Fluconazole administered.   Objective:  Vital signs in last 24 hours: Vitals:   06/30/20 1800 06/30/20 1930 06/30/20 2130 07/01/20 0446  BP: 113/64  136/87 138/77  Pulse: 67 68 73 70  Resp: 19 (!) 24 18 18   Temp:   98.1 F (36.7 C) 98.9 F (37.2 C)  TempSrc:   Oral Oral  SpO2: 100% 95% 96% 94%  Weight:      Height:        Physical Exam Constitutional: no acute distress Head: atraumatic ENT: external ears normal, anterior cervical lymphadenopathy present reduced, supraclavicular lymphadenopathy no longer present, no axillary lymphadenopathy Eyes: EOMI Cardiovascular: regular rate and rhythm, normal heart sounds Pulmonary: effort normal, normal breath sounds bilaterally Abdominal: flat Skin: warm and dry Neurological: alert, no focal deficit Psychiatric: normal mood and affect   Assessment/Plan: Ms. Vanderford is a 54 y/o F with medical history of osteoarthritis, COPD, GERD, and alcohol use disorder presenting with HHS, no prior DM diagnosis. Also with supraclavicular lymphadenopathy, no prior mammogram. Symptoms resolved with hyperglycemia treatment. Some difficulty with blood sugar.  Principal Problem:   Hyperosmolar hyperglycemic state (HHS) (HCC) Active Problems:   Alcohol withdrawal (HCC)   COPD (chronic obstructive pulmonary disease) (HCC)   Osteoarthritis  HHS Diabetes, type II vs LADA vs secondary to another cause Hgb A1c 10.7. Patient with no history of diabetes presented with generalized weakness and dizziness. Found to have blood sugar of 894 on admission accompanied by elevated serum osmolality and beta hydroxybutyrate with mild anion gap of 16, not acidotic. Started on IV insulin and fluids with improvements in BS. Transitioned to subcutaneous insulin. Seen by diabetes educator -thiamine 100mg   PO -f/u islet cell Ab, insulin Ab, GAD Ab. Normal TSH -start mealtime insulin 5 units -continue SSI -continue Lantus 20 units at bedtime -outpatient collect C-peptide and insulin level -TOC consult for medication assistance -start GLP-1 outpatient  Supraclavicular lymphadenopathy, no mammogram Has noticed lymphadenopathy over the past few weeks. Denies any breast lumps. Has never had a mammogram. Hx of smoking. Some lymphadenopathy has improved today. CT scan of chest and abdomen unrevealing -f/u CEA, CA 19-9, and CA 125 -f/u with PCP for cancer screening  Alcohol use disorder Patient reports drinking 2-3 40 ounces bottle of liquor a day. Her last drink was yesterday and she has no history of alcohol withdraw. CIWA scores of 0 overpast day -Folic acid 1 mg daily -thiamine 100mg  PO -CIWA protocol  Dysuria Vaginal itching 1 dose flucoanzole administered. Initial UA unremarkable. -repeat UA -start clotrimazole cream -another dose fluconazole if still symptomatic tomorrow  COPD Denies SOB or cough. No wheezing on exam.  --Albuterol inhaler q6h prn for wheezing --Tiotropium Bromide 1 puffs daily  Osteoarthritis Patient has osteoarthritis of both hips, right worse than left. -Tylenol as needed for pain  Cigarette Use Patient reports smoking about 15 cigarettes/day for many years. Currently working on smoking cessation with medication. -continue home Bupropion 150 mg p.o. daily  Diet: carb modified IVF:  n/a VTE:  lovenox Prior to Admission Living Arrangement:  home Anticipated Discharge Location:  home Barriers to Discharge:  hyperglycemia Dispo: Anticipated discharge in approximately 1 day(s).   54, MD 07/01/2020, 7:13 AM Pager: [phone] After 5pm on weekdays and 1pm on weekends: On Call pager [phone]

## 2020-07-01 NOTE — Telephone Encounter (Signed)
FYI  Copied from CRM [id number]. Topic: General - Other >> Jul 01, 2020  3:30 PM Herby Abraham C wrote: Reason for CRM: case manager at the hospital Toniann Fail called in to make provider aware that pt has been diagnosed with diabetes. Scheduled pt for providers first available.   No call back needed.

## 2020-07-01 NOTE — TOC Benefit Eligibility Note (Signed)
Transition of Care [facility]) Benefit Eligibility Note    Patient Details  Name: Peggy Warren MRN: [id number] Date of Birth: October 22, 1965   Medication/Dose: Lantus and Novolog 25mg  - 75mg .  Covered?: Yes  Tier: 3 Drug  Prescription Coverage Preferred Pharmacy: [facility]  Spoke with Person/Company/Phone Number:: Pharmacy PH# [phone]  Co-Pay: $100.00  Prior Approval: No  Deductible: Unmet       Peggy Warren Phone Number: 07/01/2020, 5:07 PM

## 2020-07-02 ENCOUNTER — Inpatient Hospital Stay (HOSPITAL_COMMUNITY): Payer: 59

## 2020-07-02 LAB — BASIC METABOLIC PANEL
Anion gap: 11 (ref 5–15)
BUN: 5 mg/dL — ABNORMAL LOW (ref 6–20)
CO2: 23 mmol/L (ref 22–32)
Calcium: 9.2 mg/dL (ref 8.9–10.3)
Chloride: 101 mmol/L (ref 98–111)
Creatinine, Ser: 0.66 mg/dL (ref 0.44–1.00)
GFR calc Af Amer: >60 mL/min (ref >60)
GFR calc non Af Amer: >60 mL/min (ref >60)
Glucose, Bld: 337 mg/dL — ABNORMAL HIGH (ref 70–99)
Potassium: 3.9 mmol/L (ref 3.5–5.1)
Sodium: 135 mmol/L (ref 135–145)

## 2020-07-02 LAB — CBC
HCT: 39.8 % (ref 36.0–46.0)
Hemoglobin: 13.3 g/dL (ref 12.0–15.0)
MCH: 32 pg (ref 26.0–34.0)
MCHC: 33.4 g/dL (ref 30.0–36.0)
MCV: 95.9 fL (ref 80.0–100.0)
Platelets: 286 10*3/uL (ref 150–400)
RBC: 4.15 MIL/uL (ref 3.87–5.11)
RDW: 11.9 % (ref 11.5–15.5)
WBC: 6 10*3/uL (ref 4.0–10.5)
nRBC: 0 % (ref 0.0–0.2)

## 2020-07-02 LAB — GLUCOSE, CAPILLARY
Glucose-Capillary: 225 mg/dL — ABNORMAL HIGH (ref 70–99)
Glucose-Capillary: 336 mg/dL — ABNORMAL HIGH (ref 70–99)
Glucose-Capillary: 338 mg/dL — ABNORMAL HIGH (ref 70–99)
Glucose-Capillary: 340 mg/dL — ABNORMAL HIGH (ref 70–99)
Glucose-Capillary: 341 mg/dL — ABNORMAL HIGH (ref 70–99)
Glucose-Capillary: 414 mg/dL — ABNORMAL HIGH (ref 70–99)

## 2020-07-02 LAB — CA 125: Cancer Antigen (CA) 125: 3.5 U/mL (ref 0.0–38.1)

## 2020-07-02 LAB — GLUTAMIC ACID DECARBOXYLASE AUTO ABS: Glutamic Acid Decarb Ab: <5 U/mL (ref 0.0–5.0)

## 2020-07-02 LAB — CANCER ANTIGEN 19-9: CA 19-9: 5 U/mL (ref 0–35)

## 2020-07-02 LAB — CEA: CEA: 2.9 ng/mL (ref 0.0–4.7)

## 2020-07-02 MED ORDER — ACETAMINOPHEN 500 MG PO TABS
1000.0000 mg | ORAL_TABLET | Freq: Once | ORAL | Status: AC
  Administered 2020-07-02: 1000 mg via ORAL
  Filled 2020-07-02: qty 2

## 2020-07-02 MED ORDER — INSULIN GLARGINE 100 UNIT/ML ~~LOC~~ SOLN
50.0000 [IU] | Freq: Every day | SUBCUTANEOUS | Status: DC
  Administered 2020-07-02: 50 [IU] via SUBCUTANEOUS
  Filled 2020-07-02 (×2): qty 0.5

## 2020-07-02 MED ORDER — INSULIN ASPART 100 UNIT/ML ~~LOC~~ SOLN
7.0000 [IU] | Freq: Three times a day (TID) | SUBCUTANEOUS | Status: DC
  Administered 2020-07-02 (×3): 7 [IU] via SUBCUTANEOUS

## 2020-07-02 MED ORDER — INSULIN ASPART 100 UNIT/ML ~~LOC~~ SOLN
10.0000 [IU] | Freq: Three times a day (TID) | SUBCUTANEOUS | Status: DC
  Administered 2020-07-03: 10 [IU] via SUBCUTANEOUS

## 2020-07-02 NOTE — Plan of Care (Signed)
  Problem: Health Behavior/Discharge Planning: Goal: Ability to manage health-related needs will improve Outcome: Progressing   Problem: Clinical Measurements: Goal: Ability to maintain clinical measurements within normal limits will improve Outcome: Progressing   

## 2020-07-02 NOTE — Progress Notes (Signed)
Inpatient Diabetes Program Recommendations  AACE/ADA: New Consensus Statement on Inpatient Glycemic Control (2015)  Target Ranges:  Prepandial:   less than 140 mg/dL      Peak postprandial:   less than 180 mg/dL (1-2 hours)      Critically ill patients:  140 - 180 mg/dL   Lab Results  Component Value Date   GLUCAP 338 (H) 07/02/2020   HGBA1C 10.7 (H) 06/29/2020    Review of Glycemic Control  Diabetes history: New diagnosis  Current orders for Inpatient glycemic control:  Lantus 40 units Novolog 0-15 units tid + hs  Novolog 7 units tid meal coverage Metformin 500 mg Daily  Inpatient Diabetes Program Recommendations:    Glucose 337 after Lantus 40 units given yesterday  -   consider switching to 70/30 first dose this am.  - Consider Increasing total basal insulin dose to 50 units (70/30 35 units bid, equivalent to 49 units of basal)    D/C:  Novolog 70/30 Order # [id number] $25/30 day supply Insulin pen needles order # [id number] Glucose meter kit order # [id number]   They do have Lantus and Novolog/Humalog at the pharmacy but it will be cheaper to convert pt to 70/30 dosing.   Thanks, Tama Headings RN, MSN, BC-ADM Inpatient Diabetes Coordinator Team Pager [phone] (8a-5p)

## 2020-07-02 NOTE — TOC Progression Note (Signed)
Transition of Care [facility]) - Progression Note    Patient Details  Name: Peggy Warren MRN: [id number] Date of Birth: 1966/09/18  Transition of Care [facility]) CM/SW Contact  Bess Kinds, RN Phone Number: 07/02/2020, 4:26 PM  Clinical Narrative:     Spoke with patient at the bedside to discuss transition plans to return home with her daughter. Patient with new diagnosis of diabetes. Discussed copay for insulins $100/30 day supply. [facility] can offer payment plans.   Spoke with pharmacy at [facility] who advised that because patient has insurance that she does not qualify for patient assistance program stating that [employer] will typically kick out any patient assistance offered.   Spoke with pharmacy contact, Junious Dresser, at [facility] who verified copay. Pharmacy assistance programs provided for [employer]; [employer]; and [employer], however, [facility] could not confirm if [employer] would approve of patient assistance.   Spoke with member services at [facility] who could not confirm if patient assistance was allowed. Same patient assistance programs provided. Advised to call the Marketplace which is where member was signed up for plan.   Spoke with [place] at [employer] [phone] along with patient. Patient was able to update her information. Unfortunately patient does not qualify for special enrollment and needs to wait for open enrollment from 11/1-12/15, and new coverage will begin 10/11/2020. Information provided to patient.   Spoke with patient's daughter, Shanda Bumps, on the phone in patient's room and provided the above information. Unfortunately false income information had been provided, but [employer] stated only thing patient can do is change plans during open enrollment. Patient assistance program information provided to patient to give to her daughter. Daughter advised of [facility] willing to make payment arrangements. Daughter stated that she would not be able to pick up medications until  Friday.   Discussed patient follow up appointment with [facility] on 10/19, however, advised that Dr. Alvis Lemmings may bump her appointment to an earlier date. Discussed possible follow up with internal medicine, patient is agreeable to follow up, however, she would like to continue to be followed by Dr. Alvis Lemmings.   TOC following for transition needs.   Expected Discharge Plan: Home/Self Care Barriers to Discharge: Continued Medical Work up  Expected Discharge Plan and Services Expected Discharge Plan: Home/Self Care                                               Social Determinants of Health (SDOH) Interventions    Readmission Risk Interventions No flowsheet data found.

## 2020-07-02 NOTE — Telephone Encounter (Signed)
FYI

## 2020-07-02 NOTE — Progress Notes (Addendum)
Subjective:   Patient states she feels better today, improvement in dizziness and fatigue. Does note a new headache behind her L and R eyes. Also notes polyuria with odor. Itchiness has significant improved with clotrimazole. She expresses frustration with how long it has taken her sugars to improve. She notes difficulty in affording medications.   Objective:  Vital signs in last 24 hours: Vitals:   07/01/20 1715 07/01/20 2033 07/02/20 0123 07/02/20 0508  BP: (!) 131/95 (!) 139/93 (!) 136/116 130/85  Pulse: 68 73 71 63  Resp: 18 17 16 18   Temp: 98.3 F (36.8 C) 98.7 F (37.1 C) 98.6 F (37 C) 98.2 F (36.8 C)  TempSrc: Oral     SpO2: 99% 91% 93% 95%  Weight:  86.1 kg    Height:        Physical Exam Constitutional: no acute distress Head: atraumatic ENT: external ears normal, anterior cervical lymphadenopathy present reduced, supraclavicular lymphadenopathy no longer present, no axillary lymphadenopathy Eyes: EOMI Cardiovascular: regular rate and rhythm, normal heart sounds Pulmonary: effort normal, normal breath sounds bilaterally Abdominal: flat Skin: warm and dry Neurological: alert, no focal deficit Psychiatric: normal mood and affect   Assessment/Plan: Ms. Yerby is a 54 y/o F with medical history of osteoarthritis, COPD, GERD, and alcohol use disorder presenting with HHS, no prior DM diagnosis. Also with supraclavicular lymphadenopathy, no prior mammogram. Symptoms resolved with hyperglycemia treatment. difficulty with blood sugar control over past 2 days.  Principal Problem:   Hyperosmolar hyperglycemic state (HHS) (HCC) Active Problems:   Alcohol withdrawal (HCC)   COPD (chronic obstructive pulmonary disease) (HCC)   Osteoarthritis  HHS - resolved Diabetes, type II vs LADA vs secondary to another cause Hgb A1c 10.7. Patient with no history of diabetes presented with generalized weakness and dizziness. Found to have blood sugar of 894 on admission accompanied  by elevated serum osmolality and beta hydroxybutyrate with mild anion gap of 16, not acidotic. Started on IV insulin and fluids with improvements in BS. Transitioned to subcutaneous insulin. Seen by diabetes educator. Anti-islet Ab negative. -thiamine 100mg  PO -f/u insulin Ab, GAD Ab. Normal TSH -increase mealtime insulin to 7 units -continue SSI -increase Lantus to 50 units at bedtime -outpatient collect C-peptide and insulin level -TOC consult for medication assistance -start GLP-1 outpatient  Supraclavicular lymphadenopathy, no mammogram Has noticed lymphadenopathy over the past few weeks. Denies any breast lumps. Has never had a mammogram. Hx of smoking. Some lymphadenopathy has improved today. CT scan of chest and abdomen unrevealing. Negative CEA, CA 19-9, and CA 125. R-sided thyroid enlargement noted on exam -f/u with PCP for cancer screening -f/u 53 of thyroid and neck soft tissue   Alcohol use disorder Patient reports drinking 2-3 40 ounces bottle of liquor a day. Her last drink was yesterday and she has no history of alcohol withdraw. CIWA scores of 0. -Folic acid 1 mg daily -thiamine 100mg  PO -CIWA protocol  Dysuria Vaginal itching 1 dose flucoanzole administered. Initial and repeat UA unremarkable besides glucose >500. -continue clotrimazole cream -another dose fluconazole if still symptomatic tomorrow   COPD Denies SOB or cough. No wheezing on exam.  --Albuterol inhaler q6h prn for wheezing --Tiotropium Bromide 1 puffs daily   Osteoarthritis Patient has osteoarthritis of both hips, right worse than left. -Tylenol as needed for pain   Cigarette Use Patient reports smoking about 15 cigarettes/day for many years. Currently working on smoking cessation with medication. -continue home Bupropion 150 mg p.o. daily  Diet: carb modified IVF:  n/a VTE:  lovenox Prior to Admission Living Arrangement:  home Anticipated Discharge Location:  home Barriers to Discharge:   hyperglycemia Dispo: Anticipated discharge in approximately 0-1 day(s).   Remo Lipps, MD 07/02/2020, 6:38 AM Pager: [phone] After 5pm on weekdays and 1pm on weekends: On Call pager [phone]

## 2020-07-03 LAB — CBC
HCT: 39.6 % (ref 36.0–46.0)
Hemoglobin: 13.3 g/dL (ref 12.0–15.0)
MCH: 32.4 pg (ref 26.0–34.0)
MCHC: 33.6 g/dL (ref 30.0–36.0)
MCV: 96.4 fL (ref 80.0–100.0)
Platelets: 312 10*3/uL (ref 150–400)
RBC: 4.11 MIL/uL (ref 3.87–5.11)
RDW: 12 % (ref 11.5–15.5)
WBC: 5.2 10*3/uL (ref 4.0–10.5)
nRBC: 0 % (ref 0.0–0.2)

## 2020-07-03 LAB — BASIC METABOLIC PANEL
Anion gap: 9 (ref 5–15)
BUN: 8 mg/dL (ref 6–20)
CO2: 24 mmol/L (ref 22–32)
Calcium: 9 mg/dL (ref 8.9–10.3)
Chloride: 102 mmol/L (ref 98–111)
Creatinine, Ser: 0.81 mg/dL (ref 0.44–1.00)
GFR calc Af Amer: >60 mL/min (ref >60)
GFR calc non Af Amer: >60 mL/min (ref >60)
Glucose, Bld: 375 mg/dL — ABNORMAL HIGH (ref 70–99)
Potassium: 3.8 mmol/L (ref 3.5–5.1)
Sodium: 135 mmol/L (ref 135–145)

## 2020-07-03 LAB — GLUCOSE, CAPILLARY
Glucose-Capillary: 161 mg/dL — ABNORMAL HIGH (ref 70–99)
Glucose-Capillary: 287 mg/dL — ABNORMAL HIGH (ref 70–99)
Glucose-Capillary: 306 mg/dL — ABNORMAL HIGH (ref 70–99)
Glucose-Capillary: 319 mg/dL — ABNORMAL HIGH (ref 70–99)
Glucose-Capillary: 354 mg/dL — ABNORMAL HIGH (ref 70–99)
Glucose-Capillary: 356 mg/dL — ABNORMAL HIGH (ref 70–99)

## 2020-07-03 MED ORDER — GUAIFENESIN-DM 100-10 MG/5ML PO SYRP
5.0000 mL | ORAL_SOLUTION | ORAL | Status: DC | PRN
  Administered 2020-07-03: 5 mL via ORAL
  Filled 2020-07-03: qty 5

## 2020-07-03 MED ORDER — BUPROPION HCL ER (XL) 150 MG PO TB24
150.0000 mg | ORAL_TABLET | Freq: Two times a day (BID) | ORAL | Status: DC
  Administered 2020-07-03 – 2020-07-04 (×2): 150 mg via ORAL
  Filled 2020-07-03 (×2): qty 1

## 2020-07-03 MED ORDER — INSULIN ASPART 100 UNIT/ML ~~LOC~~ SOLN
13.0000 [IU] | Freq: Three times a day (TID) | SUBCUTANEOUS | Status: DC
  Administered 2020-07-03: 13 [IU] via SUBCUTANEOUS

## 2020-07-03 MED ORDER — INSULIN GLARGINE 100 UNIT/ML ~~LOC~~ SOLN
65.0000 [IU] | Freq: Every day | SUBCUTANEOUS | Status: DC
  Administered 2020-07-03: 65 [IU] via SUBCUTANEOUS
  Filled 2020-07-03 (×3): qty 0.65

## 2020-07-03 MED ORDER — INSULIN GLARGINE 100 UNIT/ML ~~LOC~~ SOLN
60.0000 [IU] | Freq: Every day | SUBCUTANEOUS | Status: DC
  Filled 2020-07-03: qty 0.6

## 2020-07-03 MED ORDER — INSULIN ASPART 100 UNIT/ML ~~LOC~~ SOLN
14.0000 [IU] | Freq: Three times a day (TID) | SUBCUTANEOUS | Status: DC
  Administered 2020-07-03 – 2020-07-04 (×3): 14 [IU] via SUBCUTANEOUS

## 2020-07-03 MED ORDER — NALTREXONE HCL 50 MG PO TABS
50.0000 mg | ORAL_TABLET | Freq: Every day | ORAL | Status: DC
  Administered 2020-07-03 – 2020-07-04 (×2): 50 mg via ORAL
  Filled 2020-07-03 (×2): qty 1

## 2020-07-03 NOTE — Progress Notes (Signed)
Patient self administer insulin dose this morning via teach back method.

## 2020-07-03 NOTE — Progress Notes (Signed)
Inpatient Diabetes Program Recommendations  AACE/ADA: New Consensus Statement on Inpatient Glycemic Control (2015)  Target Ranges:  Prepandial:   less than 140 mg/dL      Peak postprandial:   less than 180 mg/dL (1-2 hours)      Critically ill patients:  140 - 180 mg/dL   Lab Results  Component Value Date   GLUCAP 306 (H) 07/03/2020   HGBA1C 10.7 (H) 06/29/2020    Review of Glycemic Control Results for Peggy Warren, Peggy Warren (MRN [id number]) as of 07/03/2020 10:27  Ref. Range 07/02/2020 20:25 07/03/2020 00:23 07/03/2020 05:21 07/03/2020 07:38  Glucose-Capillary Latest Ref Range: 70 - 99 mg/dL 488 (H) 891 (H) 694 (H) 306 (H)    Diabetes history: New diagnosis  Current orders for Inpatient glycemic control:  Lantus 60 units qhs Novolog 0-15 units tid + hs  Novolog 10 units tid meal coverage Metformin 500 mg BID  Inpatient Diabetes Program Recommendations:    Per chart review, appears patient needs affordable medication regimen at discharge. Discussed plan with Dr Imogene Burn and looking at patient's co-pay and amount of insulin pen vial/month patient would be paying around the same amount ~$75-100. Verified with pharmacy and even reviewed updated co-pay cards for Novolog & Lantus.    Noted changes, if plan is to continue with crrrent regimen, consider increasing Novolog 14 units TID (assuming patient is consuming >50% of meals).    Thanks, Lujean Rave, MSN, RNC-OB Diabetes Coordinator [phone] (8a-5p)

## 2020-07-03 NOTE — Progress Notes (Signed)
Subjective:   Patient evaluated at bedside. Denies any particular complaints. Denies fatigue, SOB, CP.   Notes that Buproprion has not been that effective in reducing cigarette craving. Agreeable to trying naltrexone to help with alcohol.  I have spoken extensively with patient, Child psychotherapist, and daughter about her difficulty affording insulin.   Objective:  Vital signs in last 24 hours: Vitals:   07/02/20 1641 07/02/20 2120 07/03/20 0045 07/03/20 0524  BP: 129/89 132/83 (!) 143/84 119/76  Pulse: 70 72 73 64  Resp: 18 17 17 17   Temp: 98 F (36.7 C) 97.9 F (36.6 C) 98.7 F (37.1 C) 97.9 F (36.6 C)  TempSrc: Oral Oral Oral Oral  SpO2: 95% 93% 92% 92%  Weight:  87.5 kg    Height:        Physical Exam Constitutional: no acute distress Head: atraumatic ENT: external ears normal, anterior cervical lymphadenopathy present reduced, supraclavicular lymphadenopathy no longer present, no axillary lymphadenopathy Eyes: EOMI Cardiovascular: regular rate and rhythm, normal heart sounds Pulmonary: effort normal, normal breath sounds bilaterally Abdominal: flat Skin: warm and dry Neurological: alert, no focal deficit Psychiatric: normal mood and affect   Assessment/Plan: Ms. Mink is a 54 y/o F with medical history of osteoarthritis, COPD, GERD, and alcohol use disorder presenting with HHS, no prior DM diagnosis. Also with supraclavicular lymphadenopathy, no prior mammogram. Symptoms resolved with hyperglycemia treatment. Having difficulty with blood sugar requiring very high doses of insulin.  Principal Problem:   Hyperosmolar hyperglycemic state (HHS) (HCC) Active Problems:   Alcohol withdrawal (HCC)   COPD (chronic obstructive pulmonary disease) (HCC)   Osteoarthritis  HHS - resolved Diabetes, type II vs LADA vs secondary to another cause Hgb A1c 10.7. Patient with no history of diabetes presented with generalized weakness and dizziness. Found to have blood sugar of 894  on admission accompanied by elevated serum osmolality and beta hydroxybutyrate with mild anion gap of 16, not acidotic. Started on IV insulin and fluids with improvements in BS. Transitioned to subcutaneous insulin. Seen by diabetes educator. Anti-islet and GAD Ab negative. -thiamine 100mg  PO -f/u insulin Ab -increase mealtime insulin to 14 units -continue SSI -increase Lantus to 65 units at bedtime -outpatient collect C-peptide and insulin level -SW assisting with medication assistance -start GLP-1 outpatient  Supraclavicular lymphadenopathy, no mammogram Has noticed lymphadenopathy over the past few weeks. Denies any breast lumps. Has never had a mammogram. Hx of smoking. Some lymphadenopathy has improved today. CT scan of chest and abdomen unrevealing. Negative CEA, CA 19-9, and CA 125. R-sided thyroid enlargement noted on exam. Thyroid 53 unremarkable, lymph nodes with normal morphology. -f/u with PCP for age appropriate cancer screening   Alcohol use disorder Patient reports drinking 2-3 40 ounces bottle of liquor a day. Her last drink was yesterday and she has no history of alcohol withdraw. CIWA scores of 0. -Folic acid 1 mg daily -thiamine 100mg  PO -start naltrexone 50mg  daily -CIWA protocol  Dysuria Vaginal itching 1 dose flucoanzole administered. Initial and repeat UA unremarkable besides glucose >500. Itching much improved with clotrimazole cream. -continue clotrimazole cream   COPD Denies SOB or cough. No wheezing on exam.  --Albuterol inhaler q6h prn for wheezing --Tiotropium Bromide 1 puffs daily   Osteoarthritis Patient has osteoarthritis of both hips, right worse than left. -Tylenol as needed for pain   Cigarette Use Patient reports smoking about 15 cigarettes/day for many years. Currently working on smoking cessation with medication. -increase Bupropion to 150 mg BID daily  Diet: carb modified  IVF:  n/a VTE:  lovenox Prior to Admission Living Arrangement:   home Anticipated Discharge Location:  home Barriers to Discharge:  hyperglycemia Dispo: Anticipated discharge in approximately 1 day(s).   Remo Lipps, MD 07/03/2020, 6:54 AM Pager: [phone] After 5pm on weekdays and 1pm on weekends: On Call pager [phone]

## 2020-07-04 ENCOUNTER — Telehealth: Payer: Self-pay

## 2020-07-04 ENCOUNTER — Other Ambulatory Visit: Payer: Self-pay | Admitting: Student

## 2020-07-04 DIAGNOSIS — Z720 Tobacco use: Secondary | ICD-10-CM

## 2020-07-04 LAB — GLUCOSE, CAPILLARY
Glucose-Capillary: 282 mg/dL — ABNORMAL HIGH (ref 70–99)
Glucose-Capillary: 193 mg/dL — ABNORMAL HIGH (ref 70–99)

## 2020-07-04 MED ORDER — NALTREXONE HCL 50 MG PO TABS: 50.0000 mg | ORAL_TABLET | Freq: Every day | ORAL | 2 refills | Status: AC

## 2020-07-04 MED ORDER — NOVOLOG FLEXPEN 100 UNIT/ML ~~LOC~~ SOPN: 20.0000 [IU] | PEN_INJECTOR | Freq: Three times a day (TID) | SUBCUTANEOUS | 1 refills | Status: DC

## 2020-07-04 MED ORDER — LANTUS SOLOSTAR 100 UNIT/ML ~~LOC~~ SOPN: 65.0000 [IU] | PEN_INJECTOR | Freq: Every day | SUBCUTANEOUS | 1 refills | Status: DC

## 2020-07-04 MED ORDER — METFORMIN HCL 500 MG PO TABS: 500.0000 mg | ORAL_TABLET | Freq: Every day | ORAL | 2 refills | Status: DC

## 2020-07-04 MED ORDER — BUPROPION HCL ER (XL) 150 MG PO TB24: 150.0000 mg | ORAL_TABLET | Freq: Two times a day (BID) | ORAL | 2 refills | Status: DC

## 2020-07-04 MED ORDER — INSULIN GLARGINE 100 UNITS/ML SOLOSTAR PEN: 65.0000 [IU] | PEN_INJECTOR | Freq: Every day | SUBCUTANEOUS | 1 refills | Status: DC

## 2020-07-04 MED ORDER — PEN NEEDLES 32G X 4 MM MISC: 11 refills | Status: DC

## 2020-07-04 MED FILL — NOVOFINE PLUS PEN NDL 32GX1: 32G X 4 MM | 25 days supply | Qty: 100 | Fill #0

## 2020-07-04 MED FILL — LANTUS SOLOSTAR 100 UNITS/M: 100 | 23 days supply | Qty: 15 | Fill #0

## 2020-07-04 MED FILL — METFORMIN HCL 500 MG TABS: 500 | 30 days supply | Qty: 30 | Fill #0

## 2020-07-04 MED FILL — NOVOLOG FLEXPEN SYRINGE: 100 | 25 days supply | Qty: 15 | Fill #0

## 2020-07-04 NOTE — Discharge Summary (Signed)
Name: Peggy Warren MRN: [id number] DOB: [birth year] 54 y.o. PCP: Hoy Register, MD  Date of Admission: 06/29/2020  2:02 PM Date of Discharge: 07/04/2020 Attending Physician: Jessy Oto Md PhD Discharge Diagnosis: 1. HHS 2. New onset diabetes 3. Supraclavicular lymphadenopathy 4. Vaginal candidiasis 5. Alcohol use disorder 6. Cigarette use disorder  Discharge Medications: Allergies as of 07/04/2020   No Known Allergies     Medication List    STOP taking these medications   metroNIDAZOLE 0.75 % vaginal gel Commonly known as: METROGEL VAGINAL     TAKE these medications   albuterol 108 (90 Base) MCG/ACT inhaler Commonly known as: VENTOLIN HFA Inhale 2 puffs into the lungs every 6 (six) hours as needed for wheezing or shortness of breath.   buPROPion 150 MG 24 hr tablet Commonly known as: Wellbutrin XL Take 1 tablet (150 mg total) by mouth in the morning and at bedtime. What changed: when to take this   cetirizine 10 MG tablet Commonly known as: ZYRTEC Take 1 tablet (10 mg total) by mouth daily.   cyclobenzaprine 10 MG tablet Commonly known as: FLEXERIL Take 1 tablet (10 mg total) by mouth 2 (two) times daily as needed for muscle spasms.   diclofenac 75 MG EC tablet Commonly known as: VOLTAREN Take 1 tablet (75 mg total) by mouth 2 (two) times daily.   Lantus SoloStar 100 UNIT/ML Solostar Pen Generic drug: insulin glargine Inject 65 Units into the skin at bedtime.   metFORMIN 500 MG tablet Commonly known as: GLUCOPHAGE Take 1 tablet (500 mg total) by mouth daily with breakfast.   naltrexone 50 MG tablet Commonly known as: DEPADE Take 1 tablet (50 mg total) by mouth daily.   naproxen 500 MG tablet Commonly known as: NAPROSYN Take 1 tablet (500 mg total) by mouth 2 (two) times daily with a meal.   NovoLOG FlexPen 100 UNIT/ML FlexPen Generic drug: insulin aspart Inject 20 Units into the skin 3 (three) times daily with meals.   Pen Needles 32G X  4 MM Misc Use to administer insulin   Spiriva Respimat 2.5 MCG/ACT Aers Generic drug: Tiotropium Bromide Monohydrate Inhale 2 puffs into the lungs daily.       Disposition and follow-up:   [name] was discharged from [facility] in Good condition.  At the hospital follow up visit please address:  1.  Follow up     A. New onset diabetes - A1c of 10.7, requiring high doses of insulin, please continue titrating insulin, check insulin level and C-peptide     B. Supraclavicular lymphadenopathy - reports no prior mammogram, obtain age-appropriate cancer screenings     C. Alcohol use disorder - started on naltrexone, monitor      D. Cigarette use disorder - increased dose of Wellbutrin, monitor  2.  Labs / imaging needed at time of follow-up: C-peptide, insulin level, BMP  3.  Pending labs/ test needing follow-up: anti-insulin Ab  Follow-up Appointments:   Hospital Course by problem list: HHS Diabetes, type II vs LADA vs secondary to another cause Patient presented with blood sugar of 894 on admission accompaniedby elevatedserumosmolality and beta hydroxybutyrate withmildanion gap of 16, CBG not acidotic. No Hx of diabetes, A1c of 10.7. Complained of generalized weakness and dizziness over the past few weeks. Started on endotool with improvement in glucose and anion gap closed. Transitioned to subcutaneous insulin. Required high levels of insulins to manage blood sugar. Last CBG of 193 prior to discharge. Anti-islet and GAD  Ab negative, anti-insulin pending. Of note, had significant difficulties with affordability of insulin. I spoke extensively with case manager, her insurance company, and her family to coordinate a feasible plan. Started also on metformin. On f/u consider starting GLP-1 and checking insulin level and C-peptide level.  Supraclavicular lymphadenopathy Delays in age appropriate cancer screening Has noticed lymphadenopathy over the past few  weeks. On exam has shotty shotty anterior cervical lymphadenopathy and supraclavicular lymphadenopathy, no axillary lymph nodes. Denies any breast lumps. Has never had a mammogram. Hx of smoking. CT scan of chest and abdomen unrevealing. Negative CEA, CA 19-9, and CA 125. R-sided thyroid enlargement noted on exam, but thyroid was unremarkable on [place]. Several enlarged lymph nodes were observed but had normal morphology. F/u with PCP for age appropriate cancer screening  Alcohol use disorder Patient reports drinking 2-3 40 ounces bottle of liquor a day. Her last drink was 1 day prior to admission and she has no history of alcohol withdraw. CIWA scores of 0 this admission. Patient motivated to quit, but admits it will be difficult. Start naltrexone 50mg  daily.  Dysuria Vaginal itching UA unremarkable x2 besides glucose >500. 1 dose flucoanzole administered but continued to have itching. This improved with clotrimazole cream.  Cigarette use Patient reports smoking about 15 cigarettes/day for many years. Has been taking Bupropion 150mg  daily but states it is not helping much. Increased frequency to BID.   Discharge Vitals:   BP 127/76 (BP Location: Left Arm)   Pulse 67   Temp 97.9 F (36.6 C) (Oral)   Resp 18   Ht 5\' 1"  (1.549 m)   Wt 87.6 kg   LMP 06/24/2012   SpO2 91%   BMI 36.48 kg/m   Pertinent Labs, Studies, and Procedures:  CA 125: negative CA 19-9: egative CEA: negative Anti-islet cell Ab: negative Anti-GAD Ab: negative Anti-insulin Ab: pending  Lab Results  Component Value Date   HGBA1C 10.7 (H) 06/29/2020      Discharge Instructions: Discharge Instructions    Call MD for:  extreme fatigue   Complete by: As directed    Call MD for:  persistant dizziness or light-headedness   Complete by: As directed    Diet Carb Modified   Complete by: As directed    Discharge instructions   Complete by: As directed    [name], it has been a pleasure taking care of you!  Diabetes is a challenging diagnosis, but I believe that you will do well.  Here are your discharge instructions.  1) START Lantus, 65 units at bedtime. This is your long-acting insulin 2) START Novolog, 20 units before each meal. This is your short-acting insulin 3) START metformin, 500mg  with breakfast. This is a diabetes medication. 4) START Naltrexone, once daily. This will help reduce your cravings for alcohol 5) INCREASE Wellbutrin to 1 tablet twice a day. This will help reduce your cravings for cigarettes 6) Check your blood sugar in the morning and before each meal. Keep a log and bring this and your glucose meter to you next PCP visit.  6) Follow up with your PCP at [facility]. It will be important to also get your age-appropriate cancer screening tests (mammogram, colonoscopy) 7) When possible, follow up with podiatry, ophthalmology, and dentistry. [facility] may be able to help with this   Increase activity slowly   Complete by: As directed       Signed: 07/01/2020, MD 07/04/2020, 3:56 PM   Pager: [phone]

## 2020-07-04 NOTE — Telephone Encounter (Signed)
I have the One Touch Verio meter, test strips and lancets being dispensed for this patient through ins.

## 2020-07-04 NOTE — Progress Notes (Signed)
DISCHARGE NOTE HOME Peggy Warren to be discharged Home per MD order. Discussed prescriptions and follow up appointments with the patient. Prescriptions given to patient; medication list explained in detail. Patient verbalized understanding.  Skin clean, dry and intact without evidence of skin break down, no evidence of skin tears noted. IV catheter discontinued intact. Site without signs and symptoms of complications. Dressing and pressure applied. Pt denies pain at the site currently. No complaints noted.  Patient free of lines, drains, and wounds.   An After Visit Summary (AVS) was printed and given to the patient. Patient escorted via wheelchair, and discharged home via private auto.  Myrtis Hopping, RN

## 2020-07-04 NOTE — Progress Notes (Signed)
Subjective:   Patient states that she became dizzy and light-headed then developed abdominal pain shortly after eating breakfast this morning but states that all these symptoms have resolved. They lasted 5-10 minutes. She was instructed to let our team know if these symptoms recur. These symptoms occurred before she received her insulin and she states she had been feeling great other than this episode.   Objective:  Vital signs in last 24 hours: Vitals:   07/03/20 1617 07/03/20 1648 07/03/20 2100 07/04/20 0500  BP: 123/69 137/82 140/79 (!) 148/74  Pulse: 69 68 71 70  Resp: 18 16 18 18   Temp:  97.9 F (36.6 C) 98.3 F (36.8 C) 98 F (36.7 C)  TempSrc:  Oral Oral Oral  SpO2: 97% 93% 92% 92%  Weight:   87.6 kg   Height:        Physical Exam Constitutional: no acute distress Head: atraumatic ENT: external ears normal, shotty anterior cervical lymphadenopathy and supraclavicular lymphadenopathy Eyes: EOMI Cardiovascular: regular rate and rhythm, normal heart sounds Pulmonary: effort normal, normal breath sounds bilaterally Abdominal: flat Skin: warm and dry Neurological: alert, no focal deficit Psychiatric: normal mood and affect   Assessment/Plan: Peggy Warren is a 54 y/o F with medical history of osteoarthritis, COPD, GERD, and alcohol use disorder presenting with HHS, no prior DM diagnosis. Also with supraclavicular lymphadenopathy, no prior mammogram. Symptoms resolved with hyperglycemia treatment. Having difficulty with blood sugar requiring very high doses of insulin.  Principal Problem:   Hyperosmolar hyperglycemic state (HHS) (HCC) Active Problems:   Alcohol withdrawal (HCC)   COPD (chronic obstructive pulmonary disease) (HCC)   Osteoarthritis  HHS - resolved Diabetes, type II vs LADA vs secondary to another cause Hgb A1c 10.7. Patient with no history of diabetes presented with generalized weakness and dizziness. Found to have blood sugar of 894 on admission  accompanied by elevated serum osmolality and beta hydroxybutyrate with mild anion gap of 16, not acidotic. Started on IV insulin and fluids with improvements in BS. Transitioned to subcutaneous insulin. Seen by diabetes educator. Anti-islet and GAD Ab negative. -thiamine 100mg  PO -f/u insulin Ab -increase meal time insulin to 20 units on discharge -continue SSI -continue Lantus to 65 units at bedtime -outpatient collect C-peptide and insulin level -SW assisting with medication assistance -start GLP-1 outpatient  Supraclavicular lymphadenopathy, no mammogram Has noticed lymphadenopathy over the past few weeks. Denies any breast lumps. Has never had a mammogram. Hx of smoking. Some lymphadenopathy has improved today. CT scan of chest and abdomen unrevealing. Negative CEA, CA 19-9, and CA 125. R-sided thyroid enlargement noted on exam. Thyroid 53 unremarkable, lymph nodes with normal morphology. -f/u with PCP for age appropriate cancer screening   Alcohol use disorder Patient reports drinking 2-3 40 ounces bottle of liquor a day. Her last drink was yesterday and she has no history of alcohol withdraw. CIWA scores of 0. -Folic acid 1 mg daily -thiamine 100mg  PO -continue naltrexone 50mg  daily -CIWA protocol  Dysuria Vaginal itching 1 dose flucoanzole administered. Initial and repeat UA unremarkable besides glucose >500. Itching much improved with clotrimazole cream. -continue clotrimazole cream   COPD Denies SOB or cough. No wheezing on exam.  --Albuterol inhaler q6h prn for wheezing --Tiotropium Bromide 1 puffs daily   Osteoarthritis Patient has osteoarthritis of both hips, right worse than left. -Tylenol as needed for pain   Cigarette Use Patient reports smoking about 15 cigarettes/day for many years. Currently working on smoking cessation with medication. -continue Bupropion to 150 mg  BID daily  Diet: carb modified IVF:  n/a VTE:  lovenox Prior to Admission Living  Arrangement:  home Anticipated Discharge Location:  home Barriers to Discharge:  hyperglycemia Dispo: Anticipated discharge in approximately 0 day(s).   Remo Lipps, MD 07/04/2020, 6:19 AM Pager: [phone] After 5pm on weekdays and 1pm on weekends: On Call pager [phone]

## 2020-07-06 LAB — INSULIN ANTIBODIES, BLOOD: Insulin Antibodies, Human: <5 uU/mL

## 2020-07-07 ENCOUNTER — Telehealth: Payer: Self-pay

## 2020-07-07 MED ORDER — TRUE METRIX BLOOD GLUCOSE TEST VI STRP: ORAL_STRIP | 2 refills | Status: DC

## 2020-07-07 MED ORDER — TRUE METRIX METER W/DEVICE KIT: PACK | 0 refills | Status: DC

## 2020-07-07 MED ORDER — TRUEPLUS LANCETS 28G MISC: 2 refills | Status: DC

## 2020-07-07 MED FILL — BUPROPION HCL XL 150 MG TAB: 150 | 30 days supply | Qty: 60 | Fill #0

## 2020-07-07 MED FILL — NALTREXONE 50 MG TABLET: 50 | 30 days supply | Qty: 30 | Fill #0

## 2020-07-07 NOTE — Telephone Encounter (Signed)
Call placed to patient and informed her that the prescription for the glucometer has been sent to [facility]. This CM explained that the pharmacy did recommend that she may want to check on a [employer] brand meter at [facility] because that meter and the strips /lancets will be cheaper than the meter that her insurance will cover.   Also explained to her that as per Mardee Postin, [facility] Pharmacy Tech, she can charge the 2 medications that she needs - bupropion and naltrexone. That would bring her charges with the pharmacy account to $88 and she is allowed to charge up to $100.  She can make payments on that account as she is able. She said she understood and to have the prescriptions filled but she is not sure when she can pick them up.

## 2020-07-07 NOTE — Telephone Encounter (Signed)
Transition Care Management Follow-up Telephone Call  Date of discharge and from where: 07/04/2020, [facility]  How have you been since you were released from the hospital? She said she is doing okay right now but she can't afford her medications. She has the insulins and metformin but nothing else. Informed her that this CM would contact  [facility] pharmacy to inquire if they have any programs available to assist her with medication costs.    Any questions or concerns? noted above  Items Reviewed:  Did the pt receive and understand the discharge instructions provided?  yes  Medications obtained and verified?  concerns noted above  Any new allergies since your discharge?  none reported   Do you have support at home?  she lives with her daughter  No home health or DME ordered  Needs a glucometer  Functional Questionnaire: (I = Independent and D = Dependent) ADLs: independent Follow up appointments reviewed:   PCP Hospital f/u appt confirmed? Dr Alvis Lemmings 07/16/2020  Specialist Hospital f/u appt confirmed? none scheduled at this time   Are transportation arrangements needed?  she takes the bus.  Instructed her to call [facility] to inquire if they provide transportation to medical appointments  If their condition worsens, is the pt aware to call PCP or go to the Emergency Dept.? yes  Was the patient provided with contact information for the PCP's office or ED? yes, she has the phone number for the clinic  Was to pt encouraged to call back with questions or concerns?  yes

## 2020-07-07 NOTE — Telephone Encounter (Signed)
From the discharge call.  She has appointment with D Newlin 07/16/2020  She could benefit from an appointment with [facility] for DM teaching.    She said she is doing okay right now but she can't afford her medications. She has the insulins and metformin but nothing else. Informed her that this CM would contact  [facility] pharmacy to inquire if they have any programs available to assist her with medication costs.   I have reached out to Mardee Postin, Pharmacy Tech to inquire if there are any programs that might offer assistance with the medication copays.    She needs a glucometer

## 2020-07-07 NOTE — Telephone Encounter (Signed)
Rx sent for glucometer and testing supplies.

## 2020-07-16 ENCOUNTER — Ambulatory Visit: Payer: 59 | Attending: Family Medicine | Admitting: Family Medicine

## 2020-07-16 ENCOUNTER — Other Ambulatory Visit: Payer: Self-pay

## 2020-07-16 ENCOUNTER — Encounter: Payer: Self-pay | Admitting: Family Medicine

## 2020-07-16 VITALS — BP 123/79 | HR 65 | Ht 61.0 in | Wt 194.8 lb

## 2020-07-16 DIAGNOSIS — G4709 Other insomnia: Secondary | ICD-10-CM

## 2020-07-16 DIAGNOSIS — H538 Other visual disturbances: Secondary | ICD-10-CM | POA: Diagnosis not present

## 2020-07-16 DIAGNOSIS — Z1231 Encounter for screening mammogram for malignant neoplasm of breast: Secondary | ICD-10-CM | POA: Diagnosis not present

## 2020-07-16 DIAGNOSIS — Z794 Long term (current) use of insulin: Secondary | ICD-10-CM

## 2020-07-16 DIAGNOSIS — E1165 Type 2 diabetes mellitus with hyperglycemia: Secondary | ICD-10-CM

## 2020-07-16 DIAGNOSIS — Z23 Encounter for immunization: Secondary | ICD-10-CM | POA: Diagnosis not present

## 2020-07-16 LAB — GLUCOSE, POCT (MANUAL RESULT ENTRY): POC Glucose: 126 mg/dl — AB (ref 70–99)

## 2020-07-16 MED ORDER — ATORVASTATIN CALCIUM 20 MG PO TABS: 20.0000 mg | ORAL_TABLET | Freq: Every day | ORAL | 3 refills | Status: DC

## 2020-07-16 MED ORDER — TRAZODONE HCL 50 MG PO TABS: 50.0000 mg | ORAL_TABLET | Freq: Every evening | ORAL | 3 refills | Status: DC | PRN

## 2020-07-16 MED FILL — traZODone HCL 50 MG TABS: 50 | 30 days supply | Qty: 30 | Fill #0

## 2020-07-16 MED FILL — ATORVASTATIN CALCIUM 20 MG: 20 | 30 days supply | Qty: 30 | Fill #0

## 2020-07-16 NOTE — Progress Notes (Signed)
Having trouble sleeping.  Needs referral to eye doctor.  Getting shingrix vaccine today.

## 2020-07-16 NOTE — Patient Instructions (Signed)
Diabetes Mellitus and Exercise Exercising regularly is important for your overall health, especially when you have diabetes (diabetes mellitus). Exercising is not only about losing weight. It has many other health benefits, such as increasing muscle strength and bone density and reducing body fat and stress. This leads to improved fitness, flexibility, and endurance, all of which result in better overall health. Exercise has additional benefits for people with diabetes, including:  Reducing appetite.  Helping to lower and control blood glucose.  Lowering blood pressure.  Helping to control amounts of fatty substances (lipids) in the blood, such as cholesterol and triglycerides.  Helping the body to respond better to insulin (improving insulin sensitivity).  Reducing how much insulin the body needs.  Decreasing the risk for heart disease by: ? Lowering cholesterol and triglyceride levels. ? Increasing the levels of good cholesterol. ? Lowering blood glucose levels. What is my activity plan? Your health care provider or certified diabetes educator can help you make a plan for the type and frequency of exercise (activity plan) that works for you. Make sure that you:  Do at least 150 minutes of moderate-intensity or vigorous-intensity exercise each week. This could be brisk walking, biking, or water aerobics. ? Do stretching and strength exercises, such as yoga or weightlifting, at least 2 times a week. ? Spread out your activity over at least 3 days of the week.  Get some form of physical activity every day. ? Do not go more than 2 days in a row without some kind of physical activity. ? Avoid being inactive for more than 30 minutes at a time. Take frequent breaks to walk or stretch.  Choose a type of exercise or activity that you enjoy, and set realistic goals.  Start slowly, and gradually increase the intensity of your exercise over time. What do I need to know about managing my  diabetes?   Check your blood glucose before and after exercising. ? If your blood glucose is 240 mg/dL (13.3 mmol/L) or higher before you exercise, check your urine for ketones. If you have ketones in your urine, do not exercise until your blood glucose returns to normal. ? If your blood glucose is 100 mg/dL (5.6 mmol/L) or lower, eat a snack containing 15-20 grams of carbohydrate. Check your blood glucose 15 minutes after the snack to make sure that your level is above 100 mg/dL (5.6 mmol/L) before you start your exercise.  Know the symptoms of low blood glucose (hypoglycemia) and how to treat it. Your risk for hypoglycemia increases during and after exercise. Common symptoms of hypoglycemia can include: ? Hunger. ? Anxiety. ? Sweating and feeling clammy. ? Confusion. ? Dizziness or feeling light-headed. ? Increased heart rate or palpitations. ? Blurry vision. ? Tingling or numbness around the mouth, lips, or tongue. ? Tremors or shakes. ? Irritability.  Keep a rapid-acting carbohydrate snack available before, during, and after exercise to help prevent or treat hypoglycemia.  Avoid injecting insulin into areas of the body that are going to be exercised. For example, avoid injecting insulin into: ? The arms, when playing tennis. ? The legs, when jogging.  Keep records of your exercise habits. Doing this can help you and your health care provider adjust your diabetes management plan as needed. Write down: ? Food that you eat before and after you exercise. ? Blood glucose levels before and after you exercise. ? The type and amount of exercise you have done. ? When your insulin is expected to peak, if you use   insulin. Avoid exercising at times when your insulin is peaking.  When you start a new exercise or activity, work with your health care provider to make sure the activity is safe for you, and to adjust your insulin, medicines, or food intake as needed.  Drink plenty of water while  you exercise to prevent dehydration or heat stroke. Drink enough fluid to keep your urine clear or pale yellow. Summary  Exercising regularly is important for your overall health, especially when you have diabetes (diabetes mellitus).  Exercising has many health benefits, such as increasing muscle strength and bone density and reducing body fat and stress.  Your health care provider or certified diabetes educator can help you make a plan for the type and frequency of exercise (activity plan) that works for you.  When you start a new exercise or activity, work with your health care provider to make sure the activity is safe for you, and to adjust your insulin, medicines, or food intake as needed. This information is not intended to replace advice given to you by your health care provider. Make sure you discuss any questions you have with your health care provider. Document Revised: 04/21/2017 Document Reviewed: 03/08/2016 Elsevier Patient Education  2020 Elsevier Inc.  
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
## 2020-07-25 MED FILL — BD PEN NDL NANO 32GX5/32: 32G X 4 MM | 25 days supply | Qty: 100 | Fill #1

## 2020-07-25 MED FILL — LANTUS SOLOSTAR 100 UNITS/M: 100 | 23 days supply | Qty: 15 | Fill #1

## 2020-07-25 MED FILL — NOVOLOG FLEXPEN SYRINGE: 100 | 25 days supply | Qty: 15 | Fill #1

## 2020-07-28 ENCOUNTER — Encounter: Payer: Self-pay | Admitting: Pharmacist

## 2020-07-28 ENCOUNTER — Other Ambulatory Visit: Payer: Self-pay

## 2020-07-28 ENCOUNTER — Ambulatory Visit: Payer: 59 | Attending: Family Medicine | Admitting: Pharmacist

## 2020-07-28 DIAGNOSIS — E1165 Type 2 diabetes mellitus with hyperglycemia: Secondary | ICD-10-CM | POA: Diagnosis not present

## 2020-07-28 DIAGNOSIS — Z794 Long term (current) use of insulin: Secondary | ICD-10-CM

## 2020-07-28 MED ORDER — NOVOLOG FLEXPEN 100 UNIT/ML ~~LOC~~ SOPN: 18.0000 [IU] | PEN_INJECTOR | Freq: Three times a day (TID) | SUBCUTANEOUS | 1 refills | Status: DC

## 2020-07-28 MED ORDER — LANTUS SOLOSTAR 100 UNIT/ML ~~LOC~~ SOPN: 60.0000 [IU] | PEN_INJECTOR | Freq: Every day | SUBCUTANEOUS | 1 refills | Status: DC

## 2020-07-28 MED ORDER — METFORMIN HCL 500 MG PO TABS
500.0000 mg | ORAL_TABLET | Freq: Two times a day (BID) | ORAL | 2 refills | Status: DC
Start: 2020-07-28 — End: 2020-10-21

## 2020-07-28 MED FILL — LANTUS SOLOSTAR 100 UNITS/M: 100 | 25 days supply | Qty: 15 | Fill #0

## 2020-07-28 MED FILL — NOVOLOG FLEXPEN SYRINGE: 100 | 27 days supply | Qty: 15 | Fill #0

## 2020-07-28 MED FILL — traZODone HCL 50 MG TABS: 50 | 30 days supply | Qty: 30 | Fill #0

## 2020-07-28 MED FILL — METFORMIN HCL 500 MG TABS: 500 | 30 days supply | Qty: 60 | Fill #0

## 2020-07-28 NOTE — Progress Notes (Signed)
S:    PCP: Dr. Alvis Lemmings  Patient arrives in good spirits.  Presents for diabetes evaluation, education, and management. Patient was referred and last seen by Primary Care Provider on 07/16/2020 for a follow-up visit after a recent hospitalization. It was during that hospitalization that diabetes was diagnosed. At this visit her medications were continued and patient was referred to ophthalmology for blurred vision and to pharmacy for a 1 week follow up to review glucometer and regimen.   Today, patient endorses compliance with medications and brings her glucometer and sugar log from the last week for review.  Family/Social History: Current every day smoker (1 PPD), no pertinent family history noted  Insurance coverage/medication affordability: [employer]  Medication adherence reported. Sometimes has late doses, but doesn't miss doses.  Current diabetes medications include: Lantus 65 units subcutaneously nightly (of note, she reports her "pen is stuck"), Novolog 20 units subcutaneously three times daily with meals, Metformin 500 mg tablet every morning.  Current hyperlipidemia medications include: Atorvastatin 20 mg tablet daily  Patient reports hypoglycemic events. Patient endorses feeling hot and sweaty at these times.   Patient reported dietary habits: Eats 3 meals and a snack/day Breakfast:oatmeal, cereal, or grits and bacon Lunch: leftovers from dinner Dinner: chicken, pork, vegetables. Bakes meals.  Snacks: Ice cream (sugar free), popcorn, breakfast bar, crackers, fruit Drinks: diet mountain dew, coffee in the morning sweetened with splenda, water  Patient-reported exercise habits: started walking some, small exercises at home.    Patient reports nocturia (nighttime urination). Wakes up 2-4 times/night.  Patient reports neuropathy (nerve pain). Patient reports visual changes. Has appointment with ophthalmology November 15th.  Patient reports self foot exams.     O:  Lab  Results  Component Value Date   HGBA1C 10.7 (H) 06/29/2020   There were no vitals filed for this visit.  Lipid Panel     Component Value Date/Time   CHOL 162 06/06/2019 1124   TRIG 255 (H) 06/06/2019 1124   HDL 49 06/06/2019 1124   CHOLHDL 3.3 06/06/2019 1124   LDLCALC 62 06/06/2019 1124    Home fasting blood sugars: 83, 92, 85, 121, 116, 97, 66, 101, 105 Post-prandial lunch: 132, 110, 141, 78 Post-prandial dinner/PM: 86, 70, 126, 158, 101, 88, 101, 133, 101, 97, 136 7 day average: 109 Two hypoglycemic values of 66 and 70.    Clinical Atherosclerotic Cardiovascular Disease (ASCVD): No  The 10-year ASCVD risk score Denman George DC Jr., et al., 2013) is: 11.9%   Values used to calculate the score:     Age: 36 years     Sex: Female     Is Non-Hispanic African American: Yes     Diabetic: Yes     Tobacco smoker: Yes     Systolic Blood Pressure: 123 mmHg     Is BP treated: No     HDL Cholesterol: 49 mg/dL     Total Cholesterol: 162 mg/dL    A/P: Patient with recently diagnosed diabetes currently uncontrolled with A1c >7. However, glycemic control at home has improved. Medication adherence reported Device troubleshooting performed with her Lantus pen; problem resolved. Her glucose values this past week are at goal and some are even low. Because of episodes of hypoglycemia will titrate down on insulin and increase her metformin today. Patient verbalizes appropriate hypoglycemia management plan after education today.   -Decreased dose of Lantus (glargine) to 60 units subcutaneously nightly.  -Decreased dose of Novolog (aspart) to 18 units subcutaneously three times a day  with meals.  -Increased dose of metformin to 500 mg by mouth twice daily with goal to titrate up to optimal dose over time.  -Provided patient with written goals of fasting (before meal) sugars of 80-130 and post-prandial (after eating) sugars of <180 as well as verbal education on how to check glucometer log. -Explained  A1c to patient as her 6-month averages of her glucose readings and that her goal is <7. -Follow up with pharmacy in 1 week, advised patient to call if sugars change significantly before then.  -Hypoglycemia management counseling provided; Rule of 15's -Encouraged patient to continue lifestyle management to increase glycemic control (diet, 150 minutes/wk of exercise) -Next A1C anticipated 09/2020.   ASCVD risk - primary prevention in patient with diabetes. Last LDL is controlled. ASCVD risk score is not >20%. Moderate intensity statin indicated.  -Continued Atorvastatin 20 mg daily.    Health maintenance: Lipid panel collected today, UACR will be collected at follow up visit as patient unable to void bladder at time of lab collection.   Of note, patient scored positive on GAD-7 and PCQ-9 screening, recommend PCP follow-up at next visit.   Written patient instructions provided. Pt verbalized understanding of the plan discussed. Total time in face to face counseling 30 minutes.   Follow up Pharmacist Clinic Visit in one week.     Patient seen with:  Levada Schilling PharmD Candidate 2022 07/28/2020 11:51 AM   Butch Penny, PharmD, CPP Clinical Pharmacist [facility] [phone]

## 2020-07-28 NOTE — Patient Instructions (Signed)
Thank you for coming to see me today. Please do the following:  1. Take 1 tablet of metformin in the morning after breakfast. Take 1 tablet of metformin in the evening after supper.  2. Start taking Lantus 60 units daily.  3. Start taking Novolog 18 units three times a day before meals.   4. Continue checking blood sugars at home. 5. Continue making the lifestyle changes we've discussed together during our visit. Diet and exercise play a significant role in improving your blood sugars.  6. Follow-up with me/ in 1 week.    Hypoglycemia or low blood sugar:   Low blood sugar can happen quickly and may become an emergency if not treated right away.   While this shouldn't happen often, it can be brought upon if you skip a meal or do not eat enough. Also, if your insulin or other diabetes medications are dosed too high, this can cause your blood sugar to go to low.   Warning signs of low blood sugar include: 1. Feeling shaky or dizzy 2. Feeling weak or tired  3. Excessive hunger 4. Feeling anxious or upset  5. Sweating even when you aren't exercising  What to do if I experience low blood sugar? 1. Check your blood sugar with your meter. If lower than 70, proceed to step 2.  2. Treat with 3-4 glucose tablets or 3 packets of regular sugar. If these aren't around, you can try hard candy. Yet another option would be to drink 4 ounces of fruit juice or 6 ounces of REGULAR soda.  3. Re-check your sugar in 15 minutes. If it is still below 70, do what you did in step 2 again. If has come back up, go ahead and eat a snack or small meal at this time.

## 2020-07-29 ENCOUNTER — Inpatient Hospital Stay: Payer: 59 | Admitting: Family Medicine

## 2020-07-29 LAB — LIPID PANEL
Chol/HDL Ratio: 4.5 ratio — ABNORMAL HIGH (ref 0.0–4.4)
Cholesterol, Total: 184 mg/dL (ref 100–199)
HDL: 41 mg/dL (ref >39)
LDL Chol Calc (NIH): 118 mg/dL — ABNORMAL HIGH (ref 0–99)
Triglycerides: 141 mg/dL (ref 0–149)
VLDL Cholesterol Cal: 25 mg/dL (ref 5–40)

## 2020-08-01 ENCOUNTER — Telehealth: Payer: Self-pay

## 2020-08-01 NOTE — Telephone Encounter (Signed)
-----   Message from Hoy Register, MD sent at 07/30/2020  1:29 PM EDT ----- Labs reveal elevated LDL (bad cholesterol).  Please advised to adhere to atorvastatin which was started at her last visit along with a low-cholesterol diet and exercise.

## 2020-08-01 NOTE — Telephone Encounter (Signed)
Patient was called and informed of lab results via voicemail. 

## 2020-08-04 ENCOUNTER — Ambulatory Visit: Payer: 59 | Admitting: Pharmacist

## 2020-08-04 ENCOUNTER — Ambulatory Visit (HOSPITAL_COMMUNITY)
Admission: EM | Admit: 2020-08-04 | Discharge: 2020-08-05 | Disposition: A | Discharge status: Home / Self Care | Payer: 59 | Attending: Registered Nurse | Admitting: Registered Nurse

## 2020-08-04 ENCOUNTER — Encounter (HOSPITAL_COMMUNITY): Payer: Self-pay | Admitting: Registered Nurse

## 2020-08-04 ENCOUNTER — Other Ambulatory Visit: Payer: Self-pay

## 2020-08-04 DIAGNOSIS — F1414 Cocaine abuse with cocaine-induced mood disorder: Secondary | ICD-10-CM | POA: Diagnosis not present

## 2020-08-04 DIAGNOSIS — F1024 Alcohol dependence with alcohol-induced mood disorder: Secondary | ICD-10-CM

## 2020-08-04 DIAGNOSIS — F1994 Other psychoactive substance use, unspecified with psychoactive substance-induced mood disorder: Secondary | ICD-10-CM | POA: Diagnosis present

## 2020-08-04 DIAGNOSIS — Z79899 Other long term (current) drug therapy: Secondary | ICD-10-CM | POA: Diagnosis not present

## 2020-08-04 DIAGNOSIS — J438 Other emphysema: Secondary | ICD-10-CM

## 2020-08-04 DIAGNOSIS — F1014 Alcohol abuse with alcohol-induced mood disorder: Secondary | ICD-10-CM | POA: Diagnosis not present

## 2020-08-04 DIAGNOSIS — F17210 Nicotine dependence, cigarettes, uncomplicated: Secondary | ICD-10-CM | POA: Diagnosis not present

## 2020-08-04 DIAGNOSIS — Z20822 Contact with and (suspected) exposure to covid-19: Secondary | ICD-10-CM | POA: Insufficient documentation

## 2020-08-04 DIAGNOSIS — F1410 Cocaine abuse, uncomplicated: Secondary | ICD-10-CM | POA: Diagnosis present

## 2020-08-04 DIAGNOSIS — R450 Nervousness: Secondary | ICD-10-CM | POA: Diagnosis not present

## 2020-08-04 DIAGNOSIS — F332 Major depressive disorder, recurrent severe without psychotic features: Secondary | ICD-10-CM | POA: Diagnosis not present

## 2020-08-04 DIAGNOSIS — F419 Anxiety disorder, unspecified: Secondary | ICD-10-CM | POA: Insufficient documentation

## 2020-08-04 DIAGNOSIS — F1020 Alcohol dependence, uncomplicated: Secondary | ICD-10-CM | POA: Diagnosis present

## 2020-08-04 DIAGNOSIS — R45851 Suicidal ideations: Secondary | ICD-10-CM | POA: Diagnosis not present

## 2020-08-04 HISTORY — DX: Type 2 diabetes mellitus without complications: E11.9

## 2020-08-04 LAB — POCT URINE DRUG SCREEN - MANUAL ENTRY (I-SCREEN)
POC Amphetamine UR: NOT DETECTED
POC Buprenorphine (BUP): NOT DETECTED
POC Cocaine UR: NOT DETECTED
POC Marijuana UR: NOT DETECTED
POC Methadone UR: NOT DETECTED
POC Methamphetamine UR: NOT DETECTED
POC Morphine: NOT DETECTED
POC Oxazepam (BZO): NOT DETECTED
POC Oxycodone UR: NOT DETECTED
POC Secobarbital (BAR): NOT DETECTED

## 2020-08-04 LAB — RESPIRATORY PANEL BY RT PCR (FLU A&B, COVID)
Influenza A by PCR: NEGATIVE
Influenza B by PCR: NEGATIVE
SARS Coronavirus 2 by RT PCR: NEGATIVE

## 2020-08-04 LAB — POCT URINALYSIS DIP (DEVICE)
Bilirubin Urine: NEGATIVE
Glucose, UA: NEGATIVE mg/dL
Ketones, ur: NEGATIVE mg/dL
Leukocytes,Ua: NEGATIVE
Nitrite: NEGATIVE
Protein, ur: NEGATIVE mg/dL
Specific Gravity, Urine: >=1.03 (ref 1.005–1.030)
Urobilinogen, UA: 0.2 mg/dL (ref 0.0–1.0)
pH: 5.5 (ref 5.0–8.0)

## 2020-08-04 LAB — COMPREHENSIVE METABOLIC PANEL
ALT: 21 U/L (ref 0–44)
AST: 26 U/L (ref 15–41)
Albumin: 3.9 g/dL (ref 3.5–5.0)
Alkaline Phosphatase: 74 U/L (ref 38–126)
Anion gap: 11 (ref 5–15)
BUN: 11 mg/dL (ref 6–20)
CO2: 22 mmol/L (ref 22–32)
Calcium: 9.1 mg/dL (ref 8.9–10.3)
Chloride: 107 mmol/L (ref 98–111)
Creatinine, Ser: 0.74 mg/dL (ref 0.44–1.00)
GFR, Estimated: >60 mL/min (ref >60)
Glucose, Bld: 97 mg/dL (ref 70–99)
Potassium: 3.7 mmol/L (ref 3.5–5.1)
Sodium: 140 mmol/L (ref 135–145)
Total Bilirubin: 0.6 mg/dL (ref 0.3–1.2)
Total Protein: 7.6 g/dL (ref 6.5–8.1)

## 2020-08-04 LAB — CBC WITH DIFFERENTIAL/PLATELET
Abs Immature Granulocytes: 0.02 10*3/uL (ref 0.00–0.07)
Basophils Absolute: 0.1 10*3/uL (ref 0.0–0.1)
Basophils Relative: 1 %
Eosinophils Absolute: 0.2 10*3/uL (ref 0.0–0.5)
Eosinophils Relative: 2 %
HCT: 39.3 % (ref 36.0–46.0)
Hemoglobin: 13 g/dL (ref 12.0–15.0)
Immature Granulocytes: 0 %
Lymphocytes Relative: 45 %
Lymphs Abs: 3.4 10*3/uL (ref 0.7–4.0)
MCH: 32.2 pg (ref 26.0–34.0)
MCHC: 33.1 g/dL (ref 30.0–36.0)
MCV: 97.3 fL (ref 80.0–100.0)
Monocytes Absolute: 0.6 10*3/uL (ref 0.1–1.0)
Monocytes Relative: 7 %
Neutro Abs: 3.4 10*3/uL (ref 1.7–7.7)
Neutrophils Relative %: 45 %
Platelets: 366 10*3/uL (ref 150–400)
RBC: 4.04 MIL/uL (ref 3.87–5.11)
RDW: 13 % (ref 11.5–15.5)
WBC: 7.7 10*3/uL (ref 4.0–10.5)
nRBC: 0 % (ref 0.0–0.2)

## 2020-08-04 LAB — LIPID PANEL
Cholesterol: 158 mg/dL (ref 0–200)
HDL: 33 mg/dL — ABNORMAL LOW (ref >40)
LDL Cholesterol: 82 mg/dL (ref 0–99)
Total CHOL/HDL Ratio: 4.8 RATIO
Triglycerides: 214 mg/dL — ABNORMAL HIGH (ref <150)
VLDL: 43 mg/dL — ABNORMAL HIGH (ref 0–40)

## 2020-08-04 LAB — GLUCOSE, CAPILLARY: Glucose-Capillary: 116 mg/dL — ABNORMAL HIGH (ref 70–99)

## 2020-08-04 LAB — POC SARS CORONAVIRUS 2 AG: SARS Coronavirus 2 Ag: NEGATIVE

## 2020-08-04 LAB — ETHANOL: Alcohol, Ethyl (B): <10 mg/dL (ref <10)

## 2020-08-04 LAB — MAGNESIUM: Magnesium: 2 mg/dL (ref 1.7–2.4)

## 2020-08-04 LAB — TSH: TSH: 3.958 u[IU]/mL (ref 0.350–4.500)

## 2020-08-04 MED ORDER — MAGNESIUM HYDROXIDE 400 MG/5ML PO SUSP: 30.0000 mL | Freq: Every day | ORAL | Status: DC | PRN

## 2020-08-04 MED ORDER — ALBUTEROL SULFATE HFA 108 (90 BASE) MCG/ACT IN AERS: 2.0000 | INHALATION_SPRAY | Freq: Four times a day (QID) | RESPIRATORY_TRACT | Status: DC | PRN

## 2020-08-04 MED ORDER — BUPROPION HCL ER (XL) 150 MG PO TB24
150.0000 mg | ORAL_TABLET | Freq: Every day | ORAL | Status: DC
  Administered 2020-08-04 – 2020-08-05 (×2): 150 mg via ORAL
  Filled 2020-08-04 (×2): qty 1

## 2020-08-04 MED ORDER — INSULIN ASPART 100 UNIT/ML ~~LOC~~ SOLN: 0.0000 [IU] | Freq: Three times a day (TID) | SUBCUTANEOUS | Status: DC

## 2020-08-04 MED ORDER — ALUM & MAG HYDROXIDE-SIMETH 200-200-20 MG/5ML PO SUSP: 30.0000 mL | ORAL | Status: DC | PRN

## 2020-08-04 MED ORDER — METFORMIN HCL 500 MG PO TABS
500.0000 mg | ORAL_TABLET | Freq: Two times a day (BID) | ORAL | Status: DC
  Administered 2020-08-05: 500 mg via ORAL
  Filled 2020-08-04: qty 1

## 2020-08-04 MED ORDER — THIAMINE HCL 100 MG/ML IJ SOLN
100.0000 mg | Freq: Once | INTRAMUSCULAR | Status: AC
  Administered 2020-08-04: 100 mg via INTRAMUSCULAR
  Filled 2020-08-04: qty 2

## 2020-08-04 MED ORDER — ATORVASTATIN CALCIUM 10 MG PO TABS
20.0000 mg | ORAL_TABLET | Freq: Every day | ORAL | Status: DC
  Administered 2020-08-04 – 2020-08-05 (×2): 20 mg via ORAL
  Filled 2020-08-04 (×2): qty 2

## 2020-08-04 MED ORDER — LOPERAMIDE HCL 2 MG PO CAPS: 2.0000 mg | ORAL_CAPSULE | ORAL | Status: DC | PRN

## 2020-08-04 MED ORDER — HYDROXYZINE HCL 25 MG PO TABS: 25.0000 mg | ORAL_TABLET | Freq: Four times a day (QID) | ORAL | Status: DC | PRN

## 2020-08-04 MED ORDER — INSULIN GLARGINE 100 UNIT/ML ~~LOC~~ SOLN
60.0000 [IU] | Freq: Every day | SUBCUTANEOUS | Status: DC
  Administered 2020-08-04: 60 [IU] via SUBCUTANEOUS

## 2020-08-04 MED ORDER — ACETAMINOPHEN 325 MG PO TABS
650.0000 mg | ORAL_TABLET | Freq: Four times a day (QID) | ORAL | Status: DC | PRN
  Administered 2020-08-04: 650 mg via ORAL
  Filled 2020-08-04: qty 2

## 2020-08-04 MED ORDER — DICLOFENAC SODIUM 75 MG PO TBEC
75.0000 mg | DELAYED_RELEASE_TABLET | Freq: Two times a day (BID) | ORAL | Status: DC
  Administered 2020-08-04 – 2020-08-05 (×2): 75 mg via ORAL
  Filled 2020-08-04 (×2): qty 1

## 2020-08-04 MED ORDER — ONDANSETRON 4 MG PO TBDP: 4.0000 mg | ORAL_TABLET | Freq: Four times a day (QID) | ORAL | Status: DC | PRN

## 2020-08-04 MED ORDER — THIAMINE HCL 100 MG PO TABS
100.0000 mg | ORAL_TABLET | Freq: Every day | ORAL | Status: DC
  Administered 2020-08-05: 100 mg via ORAL
  Filled 2020-08-04: qty 1

## 2020-08-04 MED ORDER — ADULT MULTIVITAMIN W/MINERALS CH
1.0000 | ORAL_TABLET | Freq: Every day | ORAL | Status: DC
  Administered 2020-08-04 – 2020-08-05 (×2): 1 via ORAL
  Filled 2020-08-04 (×2): qty 1

## 2020-08-04 MED ORDER — TRAZODONE HCL 50 MG PO TABS
50.0000 mg | ORAL_TABLET | Freq: Every evening | ORAL | Status: DC | PRN
  Administered 2020-08-04: 50 mg via ORAL
  Filled 2020-08-04: qty 1

## 2020-08-04 MED ORDER — INSULIN ASPART 100 UNIT/ML ~~LOC~~ SOLN: 18.0000 [IU] | Freq: Three times a day (TID) | SUBCUTANEOUS | Status: DC

## 2020-08-04 MED ORDER — CHLORDIAZEPOXIDE HCL 25 MG PO CAPS: 25.0000 mg | ORAL_CAPSULE | Freq: Four times a day (QID) | ORAL | Status: DC | PRN
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
## 2020-08-04 NOTE — ED Notes (Signed)
Items in locker 26 
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
## 2020-08-04 NOTE — ED Triage Notes (Signed)
Pt arrived with daughter. States I need to get off these drugs and alcohol. Pt says she isn't suicidal but she did say that because she wants help to get clean. Pt calm & cooperative with assessment process.

## 2020-08-05 LAB — HEMOGLOBIN A1C
Hgb A1c MFr Bld: 8.2 % — ABNORMAL HIGH (ref 4.8–5.6)
Mean Plasma Glucose: 189 mg/dL

## 2020-08-05 LAB — GLUCOSE, CAPILLARY: Glucose-Capillary: 99 mg/dL (ref 70–99)

## 2020-08-05 MED ORDER — TRAZODONE HCL 50 MG PO TABS: 50.0000 mg | ORAL_TABLET | Freq: Every evening | ORAL | 0 refills | Status: DC | PRN

## 2020-08-05 MED ORDER — ALBUTEROL SULFATE HFA 108 (90 BASE) MCG/ACT IN AERS: 2.0000 | INHALATION_SPRAY | Freq: Four times a day (QID) | RESPIRATORY_TRACT | 0 refills | Status: DC | PRN

## 2020-08-05 MED ORDER — BUPROPION HCL ER (XL) 150 MG PO TB24: 150.0000 mg | ORAL_TABLET | Freq: Every day | ORAL | 0 refills | Status: DC

## 2020-08-05 NOTE — ED Notes (Signed)
Pt given chicken salad with chips and coke

## 2020-08-05 NOTE — ED Notes (Signed)
Nurse Discharge Note:  D:Patient denies SI/HI/AVH at this time. Pt appears calm and cooperative, and no distress noted.  A: All Personal items in locker returned to pt. Pt given AVS/Follow-UP/Prescriptions. Pt escorted to meet safe transport driver..  R:  Pt States she will comply with outpatient services, and take medications as prescribed.

## 2020-08-05 NOTE — ED Notes (Signed)
Patient denies SI/HI. Patient resting on bed with eyes open. Monitoring continues. 

## 2020-08-05 NOTE — ED Notes (Signed)
Patient resting on bed with eyes closed. Respirations even and non labored. No distress noted. Monitoring continues. ?

## 2020-08-05 NOTE — ED Notes (Signed)
Pt resting on pull out with eyes closed unlabored respirations through night in no acute distress. Safety maintained.  

## 2020-08-05 NOTE — Discharge Instructions (Addendum)
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
## 2020-08-07 ENCOUNTER — Ambulatory Visit (HOSPITAL_COMMUNITY): Payer: 59 | Admitting: Behavioral Health

## 2020-08-15 ENCOUNTER — Ambulatory Visit: Payer: 59

## 2020-08-25 ENCOUNTER — Encounter (INDEPENDENT_AMBULATORY_CARE_PROVIDER_SITE_OTHER): Payer: 59 | Admitting: Ophthalmology

## 2020-08-29 ENCOUNTER — Other Ambulatory Visit: Payer: Self-pay | Admitting: Family Medicine

## 2020-08-29 DIAGNOSIS — M5431 Sciatica, right side: Secondary | ICD-10-CM

## 2020-08-29 MED FILL — LANTUS SOLOSTAR 100 UNITS/M: 100 | 25 days supply | Qty: 15 | Fill #1

## 2020-08-29 MED FILL — DICLOFENAC SOD EC 75 MG TAB: 75 | 30 days supply | Qty: 60 | Fill #1

## 2020-08-29 MED FILL — CYCLOBENZAPRINE 10 MG TAB: 10 | 30 days supply | Qty: 60 | Fill #1

## 2020-08-29 MED FILL — NOVOLOG FLEXPEN SYRINGE: 100 | 27 days supply | Qty: 15 | Fill #1

## 2020-08-29 NOTE — Telephone Encounter (Signed)
Requested medication (s) are due for refill today: yes  Requested medication (s) are on the active medication list: yes  Last refill:  04/17/20  #60  0 refills  Future visit scheduled: yes  Notes to clinic:  Not delegated    Requested Prescriptions  Pending Prescriptions Disp Refills   cyclobenzaprine (FLEXERIL) 10 MG tablet [Pharmacy Med Name: CYCLOBENZAPRINE 10 MG TAB 10 Tablet] 60 tablet 0    Sig: Take 1 tablet (10 mg total) by mouth 2 (two) times daily as needed for muscle spasms.      Not Delegated - Analgesics:  Muscle Relaxants Failed - 08/29/2020  3:01 PM      Failed - This refill cannot be delegated      Passed - Valid encounter within last 6 months    Recent Outpatient Visits           1 month ago Type 2 diabetes mellitus with hyperglycemia, with long-term current use of insulin [facility])   [facility] [place], Cornelius Moras, RPH-CPP   1 month ago Type 2 diabetes mellitus with hyperglycemia, with long-term current use of insulin (HCC)   [facility] Hoy Register, MD   3 months ago Other emphysema [facility])   [place] [facility] Hoy Register, MD   11 months ago Sciatica of right side   [facility] Hoy Register, MD   1 year ago Annual physical exam   [facility] Hoy Register, MD       Future Appointments             In 2 weeks Lois Huxley, Cornelius Moras, RPH-CPP [place] [facility]   In 1 month Hoy Register, MD [facility]

## 2020-09-15 ENCOUNTER — Ambulatory Visit: Payer: 59 | Admitting: Pharmacist

## 2020-10-15 LAB — HM DIABETES EYE EXAM

## 2020-10-21 ENCOUNTER — Encounter: Payer: Self-pay | Admitting: Family Medicine

## 2020-10-21 ENCOUNTER — Other Ambulatory Visit: Payer: Self-pay

## 2020-10-21 ENCOUNTER — Ambulatory Visit: Payer: 59 | Attending: Family Medicine | Admitting: Family Medicine

## 2020-10-21 ENCOUNTER — Other Ambulatory Visit: Payer: Self-pay | Admitting: Family Medicine

## 2020-10-21 DIAGNOSIS — M5431 Sciatica, right side: Secondary | ICD-10-CM | POA: Diagnosis not present

## 2020-10-21 DIAGNOSIS — Z720 Tobacco use: Secondary | ICD-10-CM | POA: Diagnosis not present

## 2020-10-21 DIAGNOSIS — E1165 Type 2 diabetes mellitus with hyperglycemia: Secondary | ICD-10-CM | POA: Diagnosis not present

## 2020-10-21 DIAGNOSIS — Z794 Long term (current) use of insulin: Secondary | ICD-10-CM

## 2020-10-21 MED ORDER — DICLOFENAC SODIUM 75 MG PO TBEC: 75.0000 mg | DELAYED_RELEASE_TABLET | Freq: Two times a day (BID) | ORAL | 3 refills | Status: DC

## 2020-10-21 MED ORDER — LANTUS SOLOSTAR 100 UNIT/ML ~~LOC~~ SOPN: 60.0000 [IU] | PEN_INJECTOR | Freq: Every day | SUBCUTANEOUS | 3 refills | Status: DC

## 2020-10-21 MED ORDER — NOVOLOG FLEXPEN 100 UNIT/ML ~~LOC~~ SOPN: 18.0000 [IU] | PEN_INJECTOR | Freq: Three times a day (TID) | SUBCUTANEOUS | 1 refills | Status: DC

## 2020-10-21 MED ORDER — BUPROPION HCL ER (XL) 150 MG PO TB24: 150.0000 mg | ORAL_TABLET | Freq: Every day | ORAL | 2 refills | Status: DC

## 2020-10-21 MED ORDER — ATORVASTATIN CALCIUM 20 MG PO TABS: 20.0000 mg | ORAL_TABLET | Freq: Every day | ORAL | 3 refills | Status: DC

## 2020-10-21 MED ORDER — CYCLOBENZAPRINE HCL 10 MG PO TABS: 10.0000 mg | ORAL_TABLET | Freq: Two times a day (BID) | ORAL | 0 refills | Status: DC | PRN

## 2020-10-21 MED ORDER — METFORMIN HCL 500 MG PO TABS: 500.0000 mg | ORAL_TABLET | Freq: Two times a day (BID) | ORAL | 3 refills | Status: DC

## 2020-10-21 NOTE — Progress Notes (Signed)
Needs refills on medications. States that she feels a lump under her neck.
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
## 2020-10-22 MED FILL — METFORMIN HCL 500 MG TABS: 500 | 30 days supply | Qty: 60 | Fill #0

## 2020-10-22 MED FILL — LANTUS SOLOSTAR 100 UNITS/M: 100 | 30 days supply | Qty: 18 | Fill #0

## 2020-10-22 MED FILL — CYCLOBENZAPRINE 10 MG TAB: 10 | 30 days supply | Qty: 60 | Fill #0

## 2020-10-22 MED FILL — NOVOLOG FLEXPEN SYRINGE: 100 | 27 days supply | Qty: 15 | Fill #0

## 2020-10-22 MED FILL — BUPROPION HCL XL 150 MG TAB: 150 | 30 days supply | Qty: 30 | Fill #0

## 2020-11-20 MED FILL — PROAIR HFA 90 MCG INHALER: 108 (90 BAS | 25 days supply | Qty: 9 | Fill #0

## 2020-11-21 MED FILL — LANTUS SOLOSTAR 100 UNITS/M: 100 | 30 days supply | Qty: 18 | Fill #1

## 2020-11-21 MED FILL — NOVOLOG FLEXPEN SYRINGE: 100 | 27 days supply | Qty: 15 | Fill #1

## 2020-11-25 ENCOUNTER — Telehealth: Payer: Self-pay

## 2020-11-25 MED FILL — DICLOFENAC SOD EC 75 MG TAB: 75 | 90 days supply | Qty: 180 | Fill #0

## 2020-11-25 NOTE — Telephone Encounter (Signed)
PA FOR DICLOFENAC TAB IS APPROVED UNTIL 11/25/21

## 2020-12-31 ENCOUNTER — Other Ambulatory Visit: Payer: Self-pay

## 2020-12-31 ENCOUNTER — Ambulatory Visit: Payer: Medicaid Other | Attending: Family Medicine | Admitting: Family Medicine

## 2020-12-31 DIAGNOSIS — E1165 Type 2 diabetes mellitus with hyperglycemia: Secondary | ICD-10-CM | POA: Diagnosis not present

## 2020-12-31 DIAGNOSIS — Z794 Long term (current) use of insulin: Secondary | ICD-10-CM | POA: Diagnosis not present
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
## 2020-12-31 NOTE — Progress Notes (Signed)
No concerns today 

## 2021-01-10 ENCOUNTER — Other Ambulatory Visit: Payer: Self-pay

## 2021-01-23 ENCOUNTER — Other Ambulatory Visit: Payer: Self-pay | Admitting: Physician Assistant

## 2021-01-23 DIAGNOSIS — Z122 Encounter for screening for malignant neoplasm of respiratory organs: Secondary | ICD-10-CM

## 2021-03-27 ENCOUNTER — Other Ambulatory Visit: Payer: Self-pay | Admitting: Physician Assistant

## 2021-03-27 DIAGNOSIS — Z122 Encounter for screening for malignant neoplasm of respiratory organs: Secondary | ICD-10-CM

## 2021-03-30 ENCOUNTER — Ambulatory Visit: Payer: Medicaid Other | Attending: Physician Assistant

## 2021-04-30 ENCOUNTER — Inpatient Hospital Stay: Admission: RE | Admit: 2021-04-30 | Payer: Medicaid Other | Source: Ambulatory Visit

## 2021-05-27 ENCOUNTER — Ambulatory Visit
Admission: RE | Admit: 2021-05-27 | Discharge: 2021-05-27 | Disposition: A | Discharge status: Home / Self Care | Payer: Medicaid Other | Source: Ambulatory Visit | Attending: Physician Assistant | Admitting: Physician Assistant

## 2021-05-27 ENCOUNTER — Other Ambulatory Visit: Payer: Self-pay

## 2021-05-27 DIAGNOSIS — Z122 Encounter for screening for malignant neoplasm of respiratory organs: Secondary | ICD-10-CM

## 2021-06-01 ENCOUNTER — Other Ambulatory Visit: Payer: Self-pay | Admitting: Physician Assistant

## 2021-06-01 DIAGNOSIS — R928 Other abnormal and inconclusive findings on diagnostic imaging of breast: Secondary | ICD-10-CM

## 2021-06-12 ENCOUNTER — Other Ambulatory Visit: Payer: Self-pay | Admitting: Family Medicine

## 2021-06-12 DIAGNOSIS — R928 Other abnormal and inconclusive findings on diagnostic imaging of breast: Secondary | ICD-10-CM

## 2021-06-16 ENCOUNTER — Other Ambulatory Visit: Payer: Medicaid Other

## 2021-06-16 ENCOUNTER — Telehealth: Payer: Self-pay

## 2021-06-18 ENCOUNTER — Other Ambulatory Visit: Payer: Self-pay | Admitting: Family Medicine

## 2021-06-18 DIAGNOSIS — M5431 Sciatica, right side: Secondary | ICD-10-CM

## 2021-06-18 NOTE — Telephone Encounter (Signed)
Requested Prescriptions  Pending Prescriptions Disp Refills  . diclofenac (VOLTAREN) 75 MG EC tablet [Pharmacy Med Name: DICLOFENAC SODIUM 75MG  DR TABLETS] 60 tablet 2    Sig: TAKE 1 TABLET BY MOUTH TWICE DAILY     Analgesics:  NSAIDS Passed - 06/18/2021  3:48 AM      Passed - Cr in normal range and within 360 days    Creatinine, Ser  Date Value Ref Range Status  08/04/2020 0.74 0.44 - 1.00 mg/dL Final         Passed - HGB in normal range and within 360 days    Hemoglobin  Date Value Ref Range Status  08/04/2020 13.0 12.0 - 15.0 g/dL Final         Passed - Patient is not pregnant      Passed - Valid encounter within last 12 months    Recent Outpatient Visits          5 months ago Type 2 diabetes mellitus with hyperglycemia, with long-term current use of insulin (HCC)   [place] [facility] [place], [place], MD   8 months ago Tobacco abuse   [place] [facility] [place], [place], MD   10 months ago Type 2 diabetes mellitus with hyperglycemia, with long-term current use of insulin [facility])   [place] [facility] [place], KOOMBERKINE L, RPH-CPP   11 months ago Type 2 diabetes mellitus with hyperglycemia, with long-term current use of insulin (HCC)    [facility] [place]ann[place] Senior, MD   1 year ago Other emphysema [facility])    [facility], MD

## 2021-06-22 NOTE — Telephone Encounter (Signed)
Error

## 2021-06-30 ENCOUNTER — Ambulatory Visit
Admission: RE | Admit: 2021-06-30 | Discharge: 2021-06-30 | Disposition: A | Discharge status: Home / Self Care | Payer: Medicaid Other | Source: Ambulatory Visit | Attending: Family Medicine | Admitting: Family Medicine

## 2021-06-30 ENCOUNTER — Other Ambulatory Visit: Payer: Self-pay

## 2021-06-30 ENCOUNTER — Other Ambulatory Visit: Payer: Self-pay | Admitting: Family Medicine

## 2021-06-30 DIAGNOSIS — R928 Other abnormal and inconclusive findings on diagnostic imaging of breast: Secondary | ICD-10-CM

## 2021-06-30 DIAGNOSIS — R921 Mammographic calcification found on diagnostic imaging of breast: Secondary | ICD-10-CM

## 2021-07-08 ENCOUNTER — Other Ambulatory Visit: Payer: Self-pay

## 2021-07-08 ENCOUNTER — Ambulatory Visit
Admission: RE | Admit: 2021-07-08 | Discharge: 2021-07-08 | Disposition: A | Discharge status: Home / Self Care | Payer: Medicaid Other | Source: Ambulatory Visit | Attending: Family Medicine | Admitting: Family Medicine

## 2021-07-08 DIAGNOSIS — R921 Mammographic calcification found on diagnostic imaging of breast: Secondary | ICD-10-CM

## 2021-08-14 ENCOUNTER — Other Ambulatory Visit: Payer: Self-pay | Admitting: Family Medicine

## 2021-08-14 DIAGNOSIS — E1165 Type 2 diabetes mellitus with hyperglycemia: Secondary | ICD-10-CM

## 2021-08-14 DIAGNOSIS — Z794 Long term (current) use of insulin: Secondary | ICD-10-CM

## 2021-08-14 NOTE — Telephone Encounter (Signed)
Requested medication (s) are due for refill today - yes  Requested medication (s) are on the active medication list -yes  Future visit scheduled -no  Last refill: 10/21/20 22ml 1RF  Notes to clinic: Request RF: Attempted to call patient- left message to call back- unsure if still current patient  Requested Prescriptions  Pending Prescriptions Disp Refills   NOVOLOG FLEXPEN 100 UNIT/ML FlexPen [Pharmacy Med Name: NOVOLOG FLEXPEN INJ ([place])] 30 mL 1    Sig: INJECT 18 UNITS INTO THE SKIN THREE TIMES DAILY WITH MEALS     Endocrinology:  Diabetes - Insulins Failed - 08/14/2021  3:50 AM      Failed - HBA1C is between 0 and 7.9 and within 180 days    Hgb A1c MFr Bld  Date Value Ref Range Status  08/04/2020 8.2 (H) 4.8 - 5.6 % Final    Comment:    (NOTE)         Prediabetes: 5.7 - 6.4         Diabetes: >6.4         Glycemic control for adults with diabetes: <7.0           Failed - Valid encounter within last 6 months    Recent Outpatient Visits           7 months ago Type 2 diabetes mellitus with hyperglycemia, with long-term current use of insulin (HCC)   [place] [facility] [place], [place], MD   9 months ago Tobacco abuse   [place] [facility] [place], Odette Horns, MD   1 year ago Type 2 diabetes mellitus with hyperglycemia, with long-term current use of insulin (HCC)   [place] [facility] [place], Cornelius Moras, RPH-CPP   1 year ago Type 2 diabetes mellitus with hyperglycemia, with long-term current use of insulin (HCC)   [place] [facility] [place], Odette Horns, MD   1 year ago Other emphysema [facility])   [place] [facility] [place], Odette Horns, MD                 Requested Prescriptions  Pending Prescriptions Disp Refills   NOVOLOG FLEXPEN 100 UNIT/ML FlexPen [Pharmacy Med Name: NOVOLOG FLEXPEN INJ ([place])] 30 mL 1    Sig: INJECT 18 UNITS INTO THE SKIN THREE TIMES  DAILY WITH MEALS     Endocrinology:  Diabetes - Insulins Failed - 08/14/2021  3:50 AM      Failed - HBA1C is between 0 and 7.9 and within 180 days    Hgb A1c MFr Bld  Date Value Ref Range Status  08/04/2020 8.2 (H) 4.8 - 5.6 % Final    Comment:    (NOTE)         Prediabetes: 5.7 - 6.4         Diabetes: >6.4         Glycemic control for adults with diabetes: <7.0           Failed - Valid encounter within last 6 months    Recent Outpatient Visits           7 months ago Type 2 diabetes mellitus with hyperglycemia, with long-term current use of insulin (HCC)   [place] [facility] [place] Register, MD   9 months ago Tobacco abuse   [place] [facility] [place], Odette Horns, MD   1 year ago Type 2 diabetes mellitus with hyperglycemia, with long-term current use  of insulin [facility])   [place] [facility] [place], Cornelius Moras, RPH-CPP   1 year ago Type 2 diabetes mellitus with hyperglycemia, with long-term current use of insulin [facility])   [place] [facility] Hoy Register, MD   1 year ago Other emphysema [facility])   [place] [facility] Hoy Register, MD

## 2021-12-04 DIAGNOSIS — Z20822 Contact with and (suspected) exposure to covid-19: Secondary | ICD-10-CM | POA: Diagnosis not present

## 2021-12-08 DIAGNOSIS — D8683 Sarcoid iridocyclitis: Secondary | ICD-10-CM | POA: Insufficient documentation

## 2021-12-08 DIAGNOSIS — H21543 Posterior synechiae (iris), bilateral: Secondary | ICD-10-CM | POA: Insufficient documentation

## 2021-12-08 DIAGNOSIS — H2513 Age-related nuclear cataract, bilateral: Secondary | ICD-10-CM | POA: Insufficient documentation

## 2021-12-08 DIAGNOSIS — H3581 Retinal edema: Secondary | ICD-10-CM | POA: Insufficient documentation

## 2022-01-22 ENCOUNTER — Other Ambulatory Visit: Payer: Self-pay | Admitting: Family Medicine

## 2022-01-22 DIAGNOSIS — R921 Mammographic calcification found on diagnostic imaging of breast: Secondary | ICD-10-CM

## 2022-02-19 ENCOUNTER — Other Ambulatory Visit: Payer: Self-pay | Admitting: Family Medicine

## 2022-02-19 ENCOUNTER — Ambulatory Visit
Admission: RE | Admit: 2022-02-19 | Discharge: 2022-02-19 | Disposition: A | Discharge status: Home / Self Care | Payer: Medicaid Other | Source: Ambulatory Visit | Attending: Family Medicine | Admitting: Family Medicine

## 2022-02-19 ENCOUNTER — Ambulatory Visit: Payer: Medicaid Other

## 2022-02-19 DIAGNOSIS — R921 Mammographic calcification found on diagnostic imaging of breast: Secondary | ICD-10-CM

## 2022-03-20 IMAGING — MG MM BREAST LOCALIZATION CLIP
4 series · 4 of 12 positions shown · non-contrast
Comparison: Previous exam(s).

CLINICAL DATA: Evaluate post biopsy marker clip placement following
stereotactic core needle biopsy of left breast calcifications.

EXAM:
3D DIAGNOSTIC LEFT MAMMOGRAM POST STEREOTACTIC BIOPSY

[L CC synth-2D]
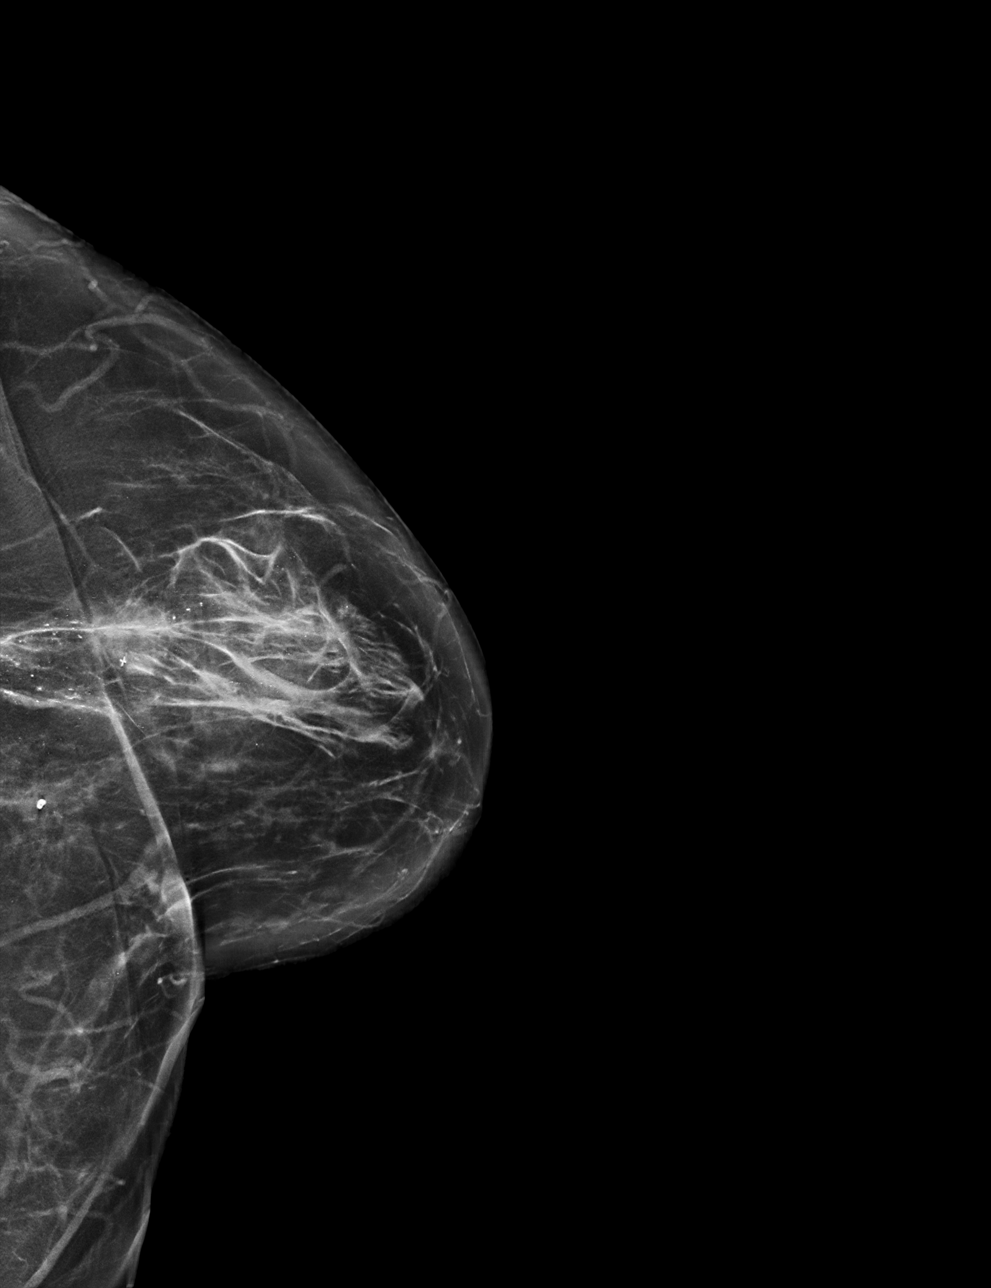

[L ML synth-2D]
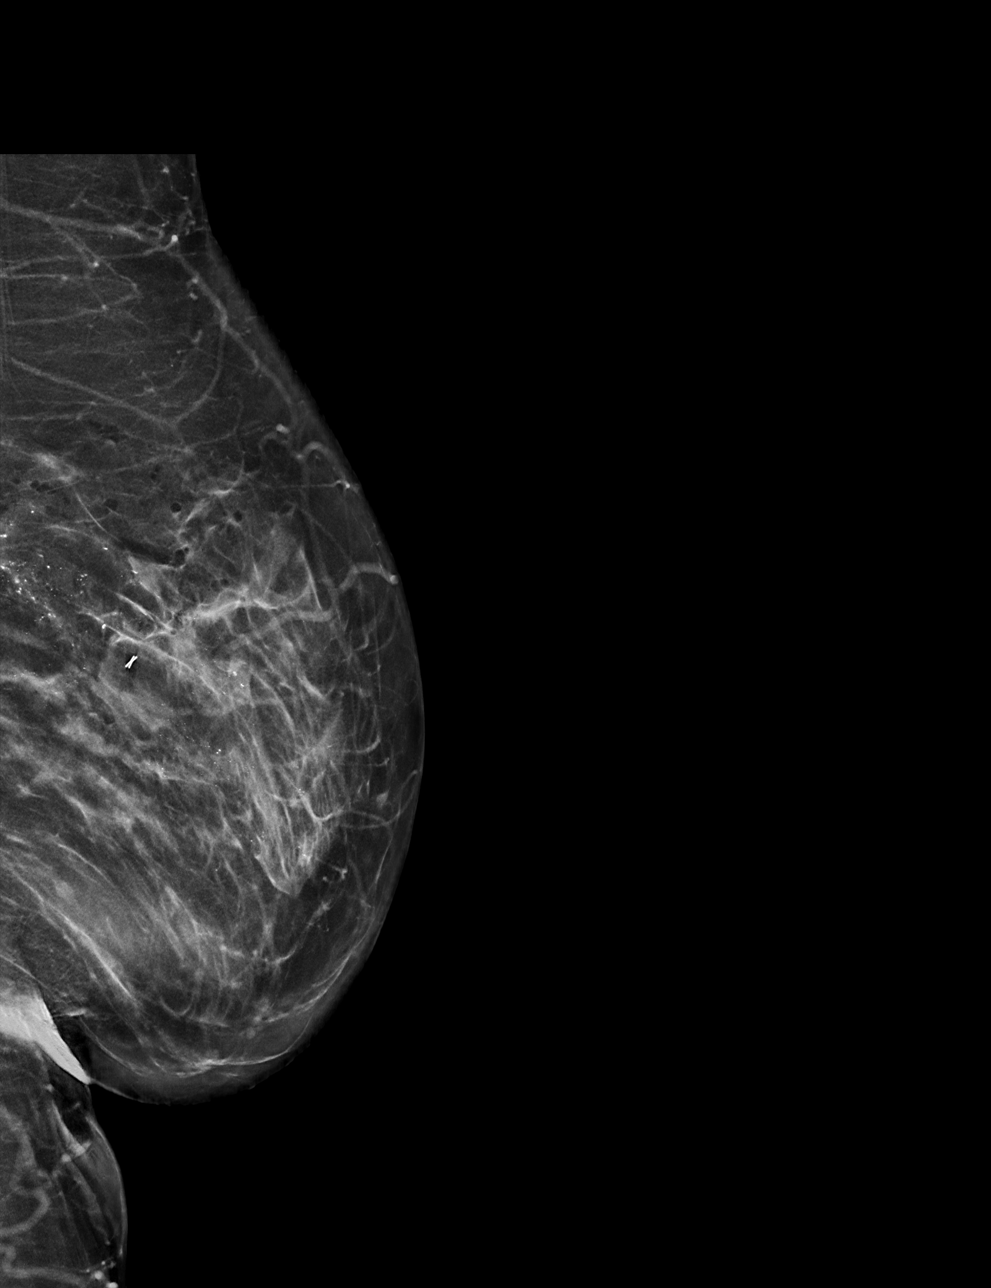

[L ML tomo · tomo slice 31/60.0]
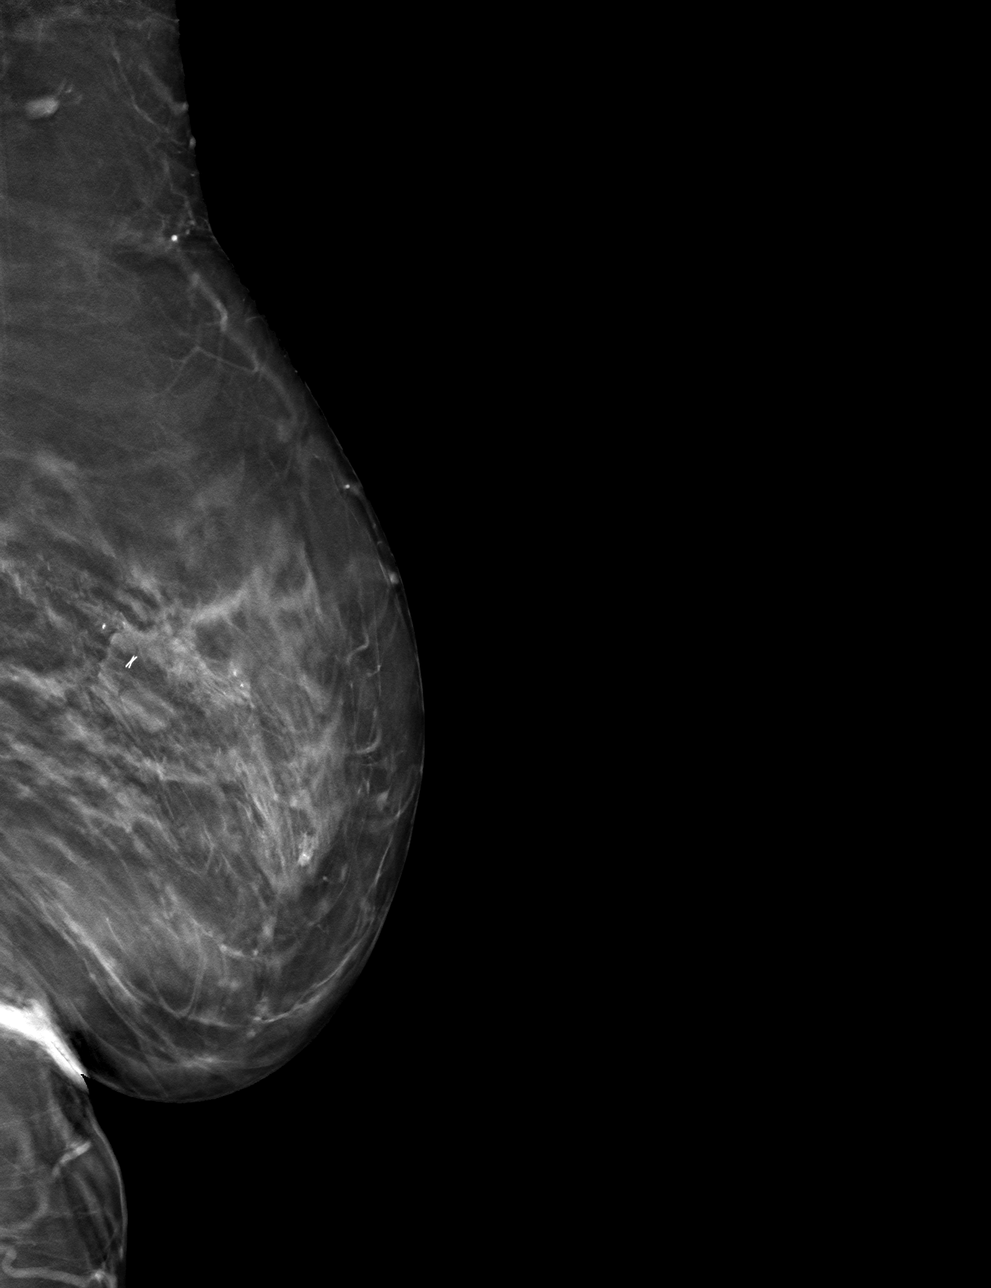

[L CC tomo · tomo slice 30/59.0]
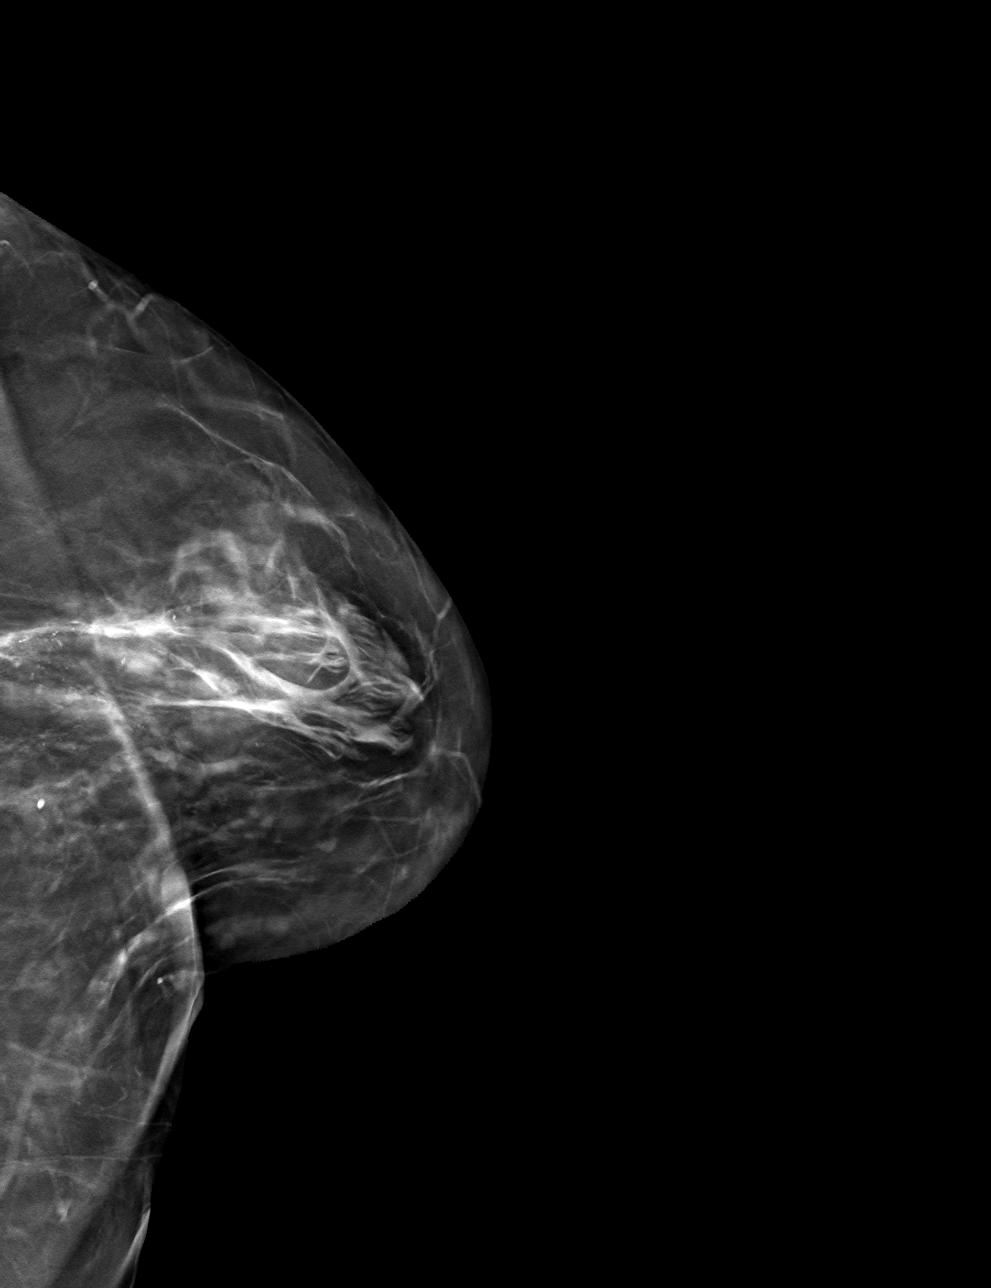

[4 of 12 positions shown; findings below may reference images not displayed]

FINDINGS: 3D Mammographic images were obtained following stereotactic guided
biopsy of left breast calcifications. The biopsy marking clip is in
expected position at the site of biopsy.
IMPRESSION: Appropriate positioning of the X shaped biopsy marking clip at the
site of biopsy in the in the central to posterior left breast,
centrally located within the residual calcifications.

Final Assessment: Post Procedure Mammograms for Marker Placement
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
## 2022-04-28 DIAGNOSIS — E119 Type 2 diabetes mellitus without complications: Secondary | ICD-10-CM | POA: Diagnosis not present

## 2022-05-05 DIAGNOSIS — E119 Type 2 diabetes mellitus without complications: Secondary | ICD-10-CM | POA: Diagnosis not present

## 2022-05-11 DIAGNOSIS — B351 Tinea unguium: Secondary | ICD-10-CM | POA: Diagnosis not present

## 2022-05-11 DIAGNOSIS — E1151 Type 2 diabetes mellitus with diabetic peripheral angiopathy without gangrene: Secondary | ICD-10-CM | POA: Diagnosis not present

## 2022-05-11 DIAGNOSIS — M7732 Calcaneal spur, left foot: Secondary | ICD-10-CM | POA: Diagnosis not present

## 2022-05-11 DIAGNOSIS — M2041 Other hammer toe(s) (acquired), right foot: Secondary | ICD-10-CM | POA: Diagnosis not present

## 2022-05-11 DIAGNOSIS — M2012 Hallux valgus (acquired), left foot: Secondary | ICD-10-CM | POA: Diagnosis not present

## 2022-05-11 DIAGNOSIS — M2011 Hallux valgus (acquired), right foot: Secondary | ICD-10-CM | POA: Diagnosis not present

## 2022-05-11 DIAGNOSIS — M7661 Achilles tendinitis, right leg: Secondary | ICD-10-CM | POA: Diagnosis not present

## 2022-05-11 DIAGNOSIS — M792 Neuralgia and neuritis, unspecified: Secondary | ICD-10-CM | POA: Diagnosis not present

## 2022-05-11 DIAGNOSIS — M2042 Other hammer toe(s) (acquired), left foot: Secondary | ICD-10-CM | POA: Diagnosis not present

## 2022-05-19 DIAGNOSIS — E119 Type 2 diabetes mellitus without complications: Secondary | ICD-10-CM | POA: Diagnosis not present

## 2022-05-26 DIAGNOSIS — E119 Type 2 diabetes mellitus without complications: Secondary | ICD-10-CM | POA: Diagnosis not present

## 2022-06-09 DIAGNOSIS — E119 Type 2 diabetes mellitus without complications: Secondary | ICD-10-CM | POA: Diagnosis not present

## 2022-06-16 DIAGNOSIS — E119 Type 2 diabetes mellitus without complications: Secondary | ICD-10-CM | POA: Diagnosis not present

## 2022-06-23 DIAGNOSIS — E119 Type 2 diabetes mellitus without complications: Secondary | ICD-10-CM | POA: Diagnosis not present

## 2022-06-30 DIAGNOSIS — E119 Type 2 diabetes mellitus without complications: Secondary | ICD-10-CM | POA: Diagnosis not present

## 2022-07-07 DIAGNOSIS — E119 Type 2 diabetes mellitus without complications: Secondary | ICD-10-CM | POA: Diagnosis not present

## 2022-07-14 DIAGNOSIS — E119 Type 2 diabetes mellitus without complications: Secondary | ICD-10-CM | POA: Diagnosis not present

## 2022-07-21 DIAGNOSIS — E119 Type 2 diabetes mellitus without complications: Secondary | ICD-10-CM | POA: Diagnosis not present

## 2022-07-28 DIAGNOSIS — E119 Type 2 diabetes mellitus without complications: Secondary | ICD-10-CM | POA: Diagnosis not present

## 2022-07-29 ENCOUNTER — Other Ambulatory Visit: Payer: Self-pay | Admitting: Family Medicine

## 2022-07-29 ENCOUNTER — Ambulatory Visit
Admission: RE | Admit: 2022-07-29 | Discharge: 2022-07-29 | Disposition: A | Discharge status: Home / Self Care | Payer: Medicaid Other | Source: Ambulatory Visit | Attending: Family Medicine | Admitting: Family Medicine

## 2022-07-29 DIAGNOSIS — R921 Mammographic calcification found on diagnostic imaging of breast: Secondary | ICD-10-CM

## 2022-07-30 ENCOUNTER — Other Ambulatory Visit: Payer: Self-pay | Admitting: Family Medicine

## 2022-07-30 DIAGNOSIS — R921 Mammographic calcification found on diagnostic imaging of breast: Secondary | ICD-10-CM

## 2022-08-03 DIAGNOSIS — E119 Type 2 diabetes mellitus without complications: Secondary | ICD-10-CM | POA: Diagnosis not present

## 2022-08-03 DIAGNOSIS — Z6836 Body mass index (BMI) 36.0-36.9, adult: Secondary | ICD-10-CM | POA: Diagnosis not present

## 2022-08-03 DIAGNOSIS — Z0130 Encounter for examination of blood pressure without abnormal findings: Secondary | ICD-10-CM | POA: Diagnosis not present

## 2022-08-03 DIAGNOSIS — I10 Essential (primary) hypertension: Secondary | ICD-10-CM | POA: Diagnosis not present

## 2022-08-03 DIAGNOSIS — J449 Chronic obstructive pulmonary disease, unspecified: Secondary | ICD-10-CM | POA: Diagnosis not present

## 2022-08-03 DIAGNOSIS — Z23 Encounter for immunization: Secondary | ICD-10-CM | POA: Diagnosis not present

## 2022-08-03 DIAGNOSIS — E7800 Pure hypercholesterolemia, unspecified: Secondary | ICD-10-CM | POA: Diagnosis not present

## 2022-08-03 DIAGNOSIS — G8929 Other chronic pain: Secondary | ICD-10-CM | POA: Diagnosis not present

## 2022-08-03 DIAGNOSIS — Z79899 Other long term (current) drug therapy: Secondary | ICD-10-CM | POA: Diagnosis not present

## 2022-08-03 DIAGNOSIS — E559 Vitamin D deficiency, unspecified: Secondary | ICD-10-CM | POA: Diagnosis not present

## 2022-08-03 DIAGNOSIS — Z0000 Encounter for general adult medical examination without abnormal findings: Secondary | ICD-10-CM | POA: Diagnosis not present

## 2022-08-03 DIAGNOSIS — M109 Gout, unspecified: Secondary | ICD-10-CM | POA: Diagnosis not present

## 2022-08-03 DIAGNOSIS — R7309 Other abnormal glucose: Secondary | ICD-10-CM | POA: Diagnosis not present

## 2022-08-03 DIAGNOSIS — M549 Dorsalgia, unspecified: Secondary | ICD-10-CM | POA: Diagnosis not present

## 2022-08-03 DIAGNOSIS — R5383 Other fatigue: Secondary | ICD-10-CM | POA: Diagnosis not present

## 2022-08-03 DIAGNOSIS — Z1151 Encounter for screening for human papillomavirus (HPV): Secondary | ICD-10-CM | POA: Diagnosis not present

## 2022-08-03 DIAGNOSIS — Z794 Long term (current) use of insulin: Secondary | ICD-10-CM | POA: Diagnosis not present

## 2022-08-04 DIAGNOSIS — E119 Type 2 diabetes mellitus without complications: Secondary | ICD-10-CM | POA: Diagnosis not present

## 2022-08-11 DIAGNOSIS — E119 Type 2 diabetes mellitus without complications: Secondary | ICD-10-CM | POA: Diagnosis not present

## 2022-08-18 DIAGNOSIS — E119 Type 2 diabetes mellitus without complications: Secondary | ICD-10-CM | POA: Diagnosis not present

## 2022-08-25 DIAGNOSIS — E119 Type 2 diabetes mellitus without complications: Secondary | ICD-10-CM | POA: Diagnosis not present

## 2022-09-01 DIAGNOSIS — E119 Type 2 diabetes mellitus without complications: Secondary | ICD-10-CM | POA: Diagnosis not present

## 2022-09-08 DIAGNOSIS — E119 Type 2 diabetes mellitus without complications: Secondary | ICD-10-CM | POA: Diagnosis not present

## 2022-09-15 DIAGNOSIS — E119 Type 2 diabetes mellitus without complications: Secondary | ICD-10-CM | POA: Diagnosis not present

## 2022-09-24 DIAGNOSIS — Z87891 Personal history of nicotine dependence: Secondary | ICD-10-CM | POA: Diagnosis not present

## 2022-09-24 DIAGNOSIS — J439 Emphysema, unspecified: Secondary | ICD-10-CM | POA: Diagnosis not present

## 2022-09-24 DIAGNOSIS — Z794 Long term (current) use of insulin: Secondary | ICD-10-CM | POA: Diagnosis not present

## 2022-09-24 DIAGNOSIS — E119 Type 2 diabetes mellitus without complications: Secondary | ICD-10-CM | POA: Diagnosis not present

## 2022-09-24 DIAGNOSIS — F17210 Nicotine dependence, cigarettes, uncomplicated: Secondary | ICD-10-CM | POA: Diagnosis not present

## 2022-09-24 DIAGNOSIS — J449 Chronic obstructive pulmonary disease, unspecified: Secondary | ICD-10-CM | POA: Diagnosis not present

## 2022-09-29 DIAGNOSIS — E119 Type 2 diabetes mellitus without complications: Secondary | ICD-10-CM | POA: Diagnosis not present

## 2022-10-06 DIAGNOSIS — E119 Type 2 diabetes mellitus without complications: Secondary | ICD-10-CM | POA: Diagnosis not present

## 2022-10-13 DIAGNOSIS — E119 Type 2 diabetes mellitus without complications: Secondary | ICD-10-CM | POA: Diagnosis not present

## 2022-10-20 DIAGNOSIS — E119 Type 2 diabetes mellitus without complications: Secondary | ICD-10-CM | POA: Diagnosis not present

## 2022-10-27 DIAGNOSIS — E119 Type 2 diabetes mellitus without complications: Secondary | ICD-10-CM | POA: Diagnosis not present

## 2022-11-01 DIAGNOSIS — M549 Dorsalgia, unspecified: Secondary | ICD-10-CM | POA: Diagnosis not present

## 2022-11-01 DIAGNOSIS — E7800 Pure hypercholesterolemia, unspecified: Secondary | ICD-10-CM | POA: Diagnosis not present

## 2022-11-01 DIAGNOSIS — E119 Type 2 diabetes mellitus without complications: Secondary | ICD-10-CM | POA: Diagnosis not present

## 2022-11-01 DIAGNOSIS — M109 Gout, unspecified: Secondary | ICD-10-CM | POA: Diagnosis not present

## 2022-11-01 DIAGNOSIS — J449 Chronic obstructive pulmonary disease, unspecified: Secondary | ICD-10-CM | POA: Diagnosis not present

## 2022-11-01 DIAGNOSIS — Z79899 Other long term (current) drug therapy: Secondary | ICD-10-CM | POA: Diagnosis not present

## 2022-11-01 DIAGNOSIS — M47816 Spondylosis without myelopathy or radiculopathy, lumbar region: Secondary | ICD-10-CM | POA: Diagnosis not present

## 2022-11-01 DIAGNOSIS — Z794 Long term (current) use of insulin: Secondary | ICD-10-CM | POA: Diagnosis not present

## 2022-11-01 DIAGNOSIS — Z0130 Encounter for examination of blood pressure without abnormal findings: Secondary | ICD-10-CM | POA: Diagnosis not present

## 2022-11-01 DIAGNOSIS — I10 Essential (primary) hypertension: Secondary | ICD-10-CM | POA: Diagnosis not present

## 2022-11-01 DIAGNOSIS — F17210 Nicotine dependence, cigarettes, uncomplicated: Secondary | ICD-10-CM | POA: Diagnosis not present

## 2022-11-01 DIAGNOSIS — R5383 Other fatigue: Secondary | ICD-10-CM | POA: Diagnosis not present

## 2022-11-01 DIAGNOSIS — E559 Vitamin D deficiency, unspecified: Secondary | ICD-10-CM | POA: Diagnosis not present

## 2022-11-03 DIAGNOSIS — E119 Type 2 diabetes mellitus without complications: Secondary | ICD-10-CM | POA: Diagnosis not present

## 2022-11-10 DIAGNOSIS — E119 Type 2 diabetes mellitus without complications: Secondary | ICD-10-CM | POA: Diagnosis not present

## 2022-11-17 DIAGNOSIS — E119 Type 2 diabetes mellitus without complications: Secondary | ICD-10-CM | POA: Diagnosis not present

## 2022-11-24 DIAGNOSIS — E119 Type 2 diabetes mellitus without complications: Secondary | ICD-10-CM | POA: Diagnosis not present

## 2022-11-24 LAB — GLUCOSE, POCT (MANUAL RESULT ENTRY): POC Glucose: 154 mg/dl — AB (ref 70–99)

## 2022-11-24 NOTE — Progress Notes (Signed)
Patient has had breakfast and a snack. Pt has history of high blood pressure and type 2 diabetes. Pt has not had medication today.

## 2022-12-01 DIAGNOSIS — E119 Type 2 diabetes mellitus without complications: Secondary | ICD-10-CM | POA: Diagnosis not present

## 2022-12-06 ENCOUNTER — Encounter: Payer: Self-pay | Admitting: *Deleted

## 2022-12-06 NOTE — Progress Notes (Signed)
Pt states she remembers attending the 11/24/22 event and has the paperwork w/ her screening results - pt was able to update this caller on her current PCP, discuss the signficance of f/u with b/p and POCT blood sugar values and states she has an appt on 12/13/22 with her PCP and will take her info with her. She expressed concern about her b/p bc she remembered "my b/p was up a little bit at my last appt but they did not do anything then." We discussed options such as monitoring, keeping a written journal, like that taken at the event, to give her PCP an idea how her b/p was doing, and her blood sugar." Pt also thanked caller for f/u with her and making sure she had access to PCP and healthcare.

## 2022-12-08 DIAGNOSIS — E119 Type 2 diabetes mellitus without complications: Secondary | ICD-10-CM | POA: Diagnosis not present

## 2022-12-13 DIAGNOSIS — J449 Chronic obstructive pulmonary disease, unspecified: Secondary | ICD-10-CM | POA: Diagnosis not present

## 2022-12-13 DIAGNOSIS — Z6835 Body mass index (BMI) 35.0-35.9, adult: Secondary | ICD-10-CM | POA: Diagnosis not present

## 2022-12-13 DIAGNOSIS — Z79899 Other long term (current) drug therapy: Secondary | ICD-10-CM | POA: Diagnosis not present

## 2022-12-13 DIAGNOSIS — E559 Vitamin D deficiency, unspecified: Secondary | ICD-10-CM | POA: Diagnosis not present

## 2022-12-13 DIAGNOSIS — Z0130 Encounter for examination of blood pressure without abnormal findings: Secondary | ICD-10-CM | POA: Diagnosis not present

## 2022-12-13 DIAGNOSIS — F17210 Nicotine dependence, cigarettes, uncomplicated: Secondary | ICD-10-CM | POA: Diagnosis not present

## 2022-12-13 DIAGNOSIS — I10 Essential (primary) hypertension: Secondary | ICD-10-CM | POA: Diagnosis not present

## 2022-12-13 DIAGNOSIS — Z794 Long term (current) use of insulin: Secondary | ICD-10-CM | POA: Diagnosis not present

## 2022-12-13 DIAGNOSIS — M539 Dorsopathy, unspecified: Secondary | ICD-10-CM | POA: Diagnosis not present

## 2022-12-13 DIAGNOSIS — E119 Type 2 diabetes mellitus without complications: Secondary | ICD-10-CM | POA: Diagnosis not present

## 2022-12-13 DIAGNOSIS — M109 Gout, unspecified: Secondary | ICD-10-CM | POA: Diagnosis not present

## 2022-12-13 DIAGNOSIS — E7800 Pure hypercholesterolemia, unspecified: Secondary | ICD-10-CM | POA: Diagnosis not present

## 2022-12-15 DIAGNOSIS — E119 Type 2 diabetes mellitus without complications: Secondary | ICD-10-CM | POA: Diagnosis not present

## 2022-12-17 DIAGNOSIS — Z79899 Other long term (current) drug therapy: Secondary | ICD-10-CM | POA: Diagnosis not present

## 2022-12-17 DIAGNOSIS — E1151 Type 2 diabetes mellitus with diabetic peripheral angiopathy without gangrene: Secondary | ICD-10-CM | POA: Diagnosis not present

## 2022-12-17 DIAGNOSIS — M7731 Calcaneal spur, right foot: Secondary | ICD-10-CM | POA: Diagnosis not present

## 2022-12-17 DIAGNOSIS — M7661 Achilles tendinitis, right leg: Secondary | ICD-10-CM | POA: Diagnosis not present

## 2022-12-17 DIAGNOSIS — B351 Tinea unguium: Secondary | ICD-10-CM | POA: Diagnosis not present

## 2022-12-17 DIAGNOSIS — L603 Nail dystrophy: Secondary | ICD-10-CM | POA: Diagnosis not present

## 2022-12-20 DIAGNOSIS — F17210 Nicotine dependence, cigarettes, uncomplicated: Secondary | ICD-10-CM | POA: Diagnosis not present

## 2022-12-20 DIAGNOSIS — E559 Vitamin D deficiency, unspecified: Secondary | ICD-10-CM | POA: Diagnosis not present

## 2022-12-20 DIAGNOSIS — M539 Dorsopathy, unspecified: Secondary | ICD-10-CM | POA: Diagnosis not present

## 2022-12-20 DIAGNOSIS — M47816 Spondylosis without myelopathy or radiculopathy, lumbar region: Secondary | ICD-10-CM | POA: Diagnosis not present

## 2022-12-20 DIAGNOSIS — Z79899 Other long term (current) drug therapy: Secondary | ICD-10-CM | POA: Diagnosis not present

## 2022-12-20 DIAGNOSIS — Z6835 Body mass index (BMI) 35.0-35.9, adult: Secondary | ICD-10-CM | POA: Diagnosis not present

## 2022-12-20 DIAGNOSIS — M109 Gout, unspecified: Secondary | ICD-10-CM | POA: Diagnosis not present

## 2022-12-20 DIAGNOSIS — Z0130 Encounter for examination of blood pressure without abnormal findings: Secondary | ICD-10-CM | POA: Diagnosis not present

## 2022-12-20 DIAGNOSIS — I10 Essential (primary) hypertension: Secondary | ICD-10-CM | POA: Diagnosis not present

## 2022-12-20 DIAGNOSIS — Z794 Long term (current) use of insulin: Secondary | ICD-10-CM | POA: Diagnosis not present

## 2022-12-20 DIAGNOSIS — M549 Dorsalgia, unspecified: Secondary | ICD-10-CM | POA: Diagnosis not present

## 2022-12-20 DIAGNOSIS — E119 Type 2 diabetes mellitus without complications: Secondary | ICD-10-CM | POA: Diagnosis not present

## 2022-12-22 DIAGNOSIS — E119 Type 2 diabetes mellitus without complications: Secondary | ICD-10-CM | POA: Diagnosis not present

## 2022-12-28 DIAGNOSIS — Z79899 Other long term (current) drug therapy: Secondary | ICD-10-CM | POA: Diagnosis not present

## 2022-12-29 DIAGNOSIS — E119 Type 2 diabetes mellitus without complications: Secondary | ICD-10-CM | POA: Diagnosis not present

## 2023-01-03 DIAGNOSIS — J449 Chronic obstructive pulmonary disease, unspecified: Secondary | ICD-10-CM | POA: Diagnosis not present

## 2023-01-03 DIAGNOSIS — Z87891 Personal history of nicotine dependence: Secondary | ICD-10-CM | POA: Diagnosis not present

## 2023-01-04 DIAGNOSIS — M109 Gout, unspecified: Secondary | ICD-10-CM | POA: Diagnosis not present

## 2023-01-04 DIAGNOSIS — Z79899 Other long term (current) drug therapy: Secondary | ICD-10-CM | POA: Diagnosis not present

## 2023-01-04 DIAGNOSIS — Z794 Long term (current) use of insulin: Secondary | ICD-10-CM | POA: Diagnosis not present

## 2023-01-04 DIAGNOSIS — E559 Vitamin D deficiency, unspecified: Secondary | ICD-10-CM | POA: Diagnosis not present

## 2023-01-04 DIAGNOSIS — I10 Essential (primary) hypertension: Secondary | ICD-10-CM | POA: Diagnosis not present

## 2023-01-04 DIAGNOSIS — M549 Dorsalgia, unspecified: Secondary | ICD-10-CM | POA: Diagnosis not present

## 2023-01-04 DIAGNOSIS — F17210 Nicotine dependence, cigarettes, uncomplicated: Secondary | ICD-10-CM | POA: Diagnosis not present

## 2023-01-04 DIAGNOSIS — Z0130 Encounter for examination of blood pressure without abnormal findings: Secondary | ICD-10-CM | POA: Diagnosis not present

## 2023-01-04 DIAGNOSIS — E119 Type 2 diabetes mellitus without complications: Secondary | ICD-10-CM | POA: Diagnosis not present

## 2023-01-04 DIAGNOSIS — Z6836 Body mass index (BMI) 36.0-36.9, adult: Secondary | ICD-10-CM | POA: Diagnosis not present

## 2023-01-04 DIAGNOSIS — M539 Dorsopathy, unspecified: Secondary | ICD-10-CM | POA: Diagnosis not present

## 2023-01-05 DIAGNOSIS — E119 Type 2 diabetes mellitus without complications: Secondary | ICD-10-CM | POA: Diagnosis not present

## 2023-01-12 DIAGNOSIS — E119 Type 2 diabetes mellitus without complications: Secondary | ICD-10-CM | POA: Diagnosis not present

## 2023-01-19 DIAGNOSIS — E119 Type 2 diabetes mellitus without complications: Secondary | ICD-10-CM | POA: Diagnosis not present

## 2023-01-24 ENCOUNTER — Encounter: Payer: Self-pay | Admitting: *Deleted

## 2023-01-24 DIAGNOSIS — Z87891 Personal history of nicotine dependence: Secondary | ICD-10-CM | POA: Diagnosis not present

## 2023-01-24 NOTE — Progress Notes (Signed)
Chart review indicates pt being followed by podiatry where pt had 12/17/22 appt with extensive counseling by Dr. Harriet Pho about diabetic foot care. However, pt's PCP Chrys Racer, FNP, works at [employer], as confirmed with pt at the post-event initial contact and Toma Copier does not share records with [facility]. Attempt made to contact pt by phone to see if she was able to f/u with PCP r/t elevated b/p at 11/24/22 event but number no longer in service. Letter sent and Get Care Now flyer and [facility] flyers included in case pt needs additional PCP options.

## 2023-01-26 DIAGNOSIS — E119 Type 2 diabetes mellitus without complications: Secondary | ICD-10-CM | POA: Diagnosis not present

## 2023-02-02 DIAGNOSIS — E119 Type 2 diabetes mellitus without complications: Secondary | ICD-10-CM | POA: Diagnosis not present

## 2023-03-31 ENCOUNTER — Ambulatory Visit
Admission: RE | Admit: 2023-03-31 | Discharge: 2023-03-31 | Disposition: A | Discharge status: Home / Self Care | Payer: Medicaid Other | Source: Ambulatory Visit | Attending: Family Medicine | Admitting: Family Medicine

## 2023-03-31 DIAGNOSIS — R921 Mammographic calcification found on diagnostic imaging of breast: Secondary | ICD-10-CM

## 2023-04-25 ENCOUNTER — Other Ambulatory Visit: Payer: Self-pay | Admitting: Family Medicine

## 2023-04-25 DIAGNOSIS — R921 Mammographic calcification found on diagnostic imaging of breast: Secondary | ICD-10-CM

## 2023-05-17 ENCOUNTER — Encounter: Payer: Self-pay | Admitting: *Deleted

## 2023-05-17 NOTE — Progress Notes (Signed)
Pt attended 11/24/22 screening event where her b/p was 147/103 and her blood sugar was 154. At the event pt did not identify any SDOH insecurities. During the initial event f/u, pt shared that she would tell her PCP (at that time), who was Peggy Racer FNP at [facility] on [address],  about her event results at her 01/02/23 appt. During the 2nd event f/u, the health equity team member was unable to reach pt by phone and the PCP f/u documentation does not appear in EPIC. Additional PCP info mailed to pt in case needed. No other encounters with b/p values noted during chart review. During the final event follow-up, the health equity team member was again unable to contact pt by phone, but  did reach PCP office at [facility] on [address] and office confirmed pt was seen by Peggy Racer FNP on 01/02/23. FNP Peggy Warren had been transferred to [facility] and the PCP office stated pt had appt with her new PCP at the same office location, Peggy Scale, NP, on May 31, 2023. No additional health equity team support scheduled at this time.

## 2023-07-28 DIAGNOSIS — Z743 Need for continuous supervision: Secondary | ICD-10-CM | POA: Diagnosis not present

## 2023-07-28 DIAGNOSIS — I10 Essential (primary) hypertension: Secondary | ICD-10-CM | POA: Diagnosis not present

## 2023-07-28 DIAGNOSIS — R531 Weakness: Secondary | ICD-10-CM | POA: Diagnosis not present

## 2023-07-29 ENCOUNTER — Observation Stay (HOSPITAL_COMMUNITY): Payer: Medicaid Other

## 2023-07-29 ENCOUNTER — Emergency Department (HOSPITAL_COMMUNITY): Payer: Medicaid Other

## 2023-07-29 ENCOUNTER — Other Ambulatory Visit: Payer: Self-pay

## 2023-07-29 ENCOUNTER — Other Ambulatory Visit (HOSPITAL_COMMUNITY): Payer: Self-pay

## 2023-07-29 ENCOUNTER — Encounter (HOSPITAL_COMMUNITY): Payer: Self-pay

## 2023-07-29 ENCOUNTER — Encounter (HOSPITAL_COMMUNITY): Payer: Medicaid Other

## 2023-07-29 ENCOUNTER — Observation Stay (HOSPITAL_COMMUNITY)
Admission: EM | Admit: 2023-07-29 | Discharge: 2023-07-30 | Disposition: A | Discharge status: Home / Self Care | Payer: Medicaid Other | Attending: Student | Admitting: Student

## 2023-07-29 DIAGNOSIS — R200 Anesthesia of skin: Secondary | ICD-10-CM | POA: Diagnosis present

## 2023-07-29 DIAGNOSIS — R9082 White matter disease, unspecified: Secondary | ICD-10-CM | POA: Diagnosis not present

## 2023-07-29 DIAGNOSIS — R202 Paresthesia of skin: Secondary | ICD-10-CM | POA: Diagnosis not present

## 2023-07-29 DIAGNOSIS — G459 Transient cerebral ischemic attack, unspecified: Principal | ICD-10-CM | POA: Diagnosis present

## 2023-07-29 DIAGNOSIS — J449 Chronic obstructive pulmonary disease, unspecified: Secondary | ICD-10-CM | POA: Diagnosis not present

## 2023-07-29 DIAGNOSIS — E119 Type 2 diabetes mellitus without complications: Secondary | ICD-10-CM

## 2023-07-29 DIAGNOSIS — F17200 Nicotine dependence, unspecified, uncomplicated: Secondary | ICD-10-CM | POA: Diagnosis not present

## 2023-07-29 DIAGNOSIS — R29818 Other symptoms and signs involving the nervous system: Secondary | ICD-10-CM | POA: Diagnosis not present

## 2023-07-29 DIAGNOSIS — F1020 Alcohol dependence, uncomplicated: Secondary | ICD-10-CM | POA: Insufficient documentation

## 2023-07-29 DIAGNOSIS — I160 Hypertensive urgency: Secondary | ICD-10-CM | POA: Diagnosis not present

## 2023-07-29 DIAGNOSIS — M1990 Unspecified osteoarthritis, unspecified site: Secondary | ICD-10-CM | POA: Diagnosis present

## 2023-07-29 DIAGNOSIS — E876 Hypokalemia: Secondary | ICD-10-CM | POA: Diagnosis not present

## 2023-07-29 DIAGNOSIS — M1611 Unilateral primary osteoarthritis, right hip: Secondary | ICD-10-CM

## 2023-07-29 DIAGNOSIS — F1090 Alcohol use, unspecified, uncomplicated: Secondary | ICD-10-CM | POA: Diagnosis not present

## 2023-07-29 DIAGNOSIS — Z7984 Long term (current) use of oral hypoglycemic drugs: Secondary | ICD-10-CM | POA: Diagnosis not present

## 2023-07-29 DIAGNOSIS — Z79899 Other long term (current) drug therapy: Secondary | ICD-10-CM | POA: Diagnosis not present

## 2023-07-29 DIAGNOSIS — Z8673 Personal history of transient ischemic attack (TIA), and cerebral infarction without residual deficits: Secondary | ICD-10-CM | POA: Diagnosis not present

## 2023-07-29 DIAGNOSIS — J438 Other emphysema: Secondary | ICD-10-CM | POA: Diagnosis not present

## 2023-07-29 DIAGNOSIS — F17210 Nicotine dependence, cigarettes, uncomplicated: Secondary | ICD-10-CM | POA: Insufficient documentation

## 2023-07-29 DIAGNOSIS — Z794 Long term (current) use of insulin: Secondary | ICD-10-CM | POA: Diagnosis not present

## 2023-07-29 DIAGNOSIS — I10 Essential (primary) hypertension: Secondary | ICD-10-CM | POA: Diagnosis not present

## 2023-07-29 LAB — COMPREHENSIVE METABOLIC PANEL
ALT: 37 U/L (ref 0–44)
AST: 38 U/L (ref 15–41)
Albumin: 3.8 g/dL (ref 3.5–5.0)
Alkaline Phosphatase: 59 U/L (ref 38–126)
Anion gap: 11 (ref 5–15)
BUN: 8 mg/dL (ref 6–20)
CO2: 24 mmol/L (ref 22–32)
Calcium: 9.5 mg/dL (ref 8.9–10.3)
Chloride: 104 mmol/L (ref 98–111)
Creatinine, Ser: 0.56 mg/dL (ref 0.44–1.00)
GFR, Estimated: >60 mL/min (ref >60)
Glucose, Bld: 123 mg/dL — ABNORMAL HIGH (ref 70–99)
Potassium: 3.2 mmol/L — ABNORMAL LOW (ref 3.5–5.1)
Sodium: 139 mmol/L (ref 135–145)
Total Bilirubin: 0.7 mg/dL (ref 0.3–1.2)
Total Protein: 7.4 g/dL (ref 6.5–8.1)

## 2023-07-29 LAB — ECHOCARDIOGRAM COMPLETE
Area-P 1/2: 3.66 cm2
MV M vel: 2.01 m/s
MV Peak grad: 16.2 mm[Hg]
S' Lateral: 2.7 cm
Weight: 2776 [oz_av]

## 2023-07-29 LAB — URINALYSIS, ROUTINE W REFLEX MICROSCOPIC
Bilirubin Urine: NEGATIVE
Glucose, UA: NEGATIVE mg/dL
Hgb urine dipstick: NEGATIVE
Ketones, ur: NEGATIVE mg/dL
Leukocytes,Ua: NEGATIVE
Nitrite: NEGATIVE
Protein, ur: 30 mg/dL — AB
Specific Gravity, Urine: 1.012 (ref 1.005–1.030)
pH: 6 (ref 5.0–8.0)

## 2023-07-29 LAB — DIFFERENTIAL
Abs Immature Granulocytes: 0.01 10*3/uL (ref 0.00–0.07)
Basophils Absolute: 0.1 10*3/uL (ref 0.0–0.1)
Basophils Relative: 1 %
Eosinophils Absolute: 0.2 10*3/uL (ref 0.0–0.5)
Eosinophils Relative: 2 %
Immature Granulocytes: 0 %
Lymphocytes Relative: 46 %
Lymphs Abs: 3.3 10*3/uL (ref 0.7–4.0)
Monocytes Absolute: 0.5 10*3/uL (ref 0.1–1.0)
Monocytes Relative: 7 %
Neutro Abs: 3.1 10*3/uL (ref 1.7–7.7)
Neutrophils Relative %: 44 %

## 2023-07-29 LAB — CBC WITH DIFFERENTIAL/PLATELET
Abs Immature Granulocytes: 0.02 10*3/uL (ref 0.00–0.07)
Basophils Absolute: 0 10*3/uL (ref 0.0–0.1)
Basophils Relative: 1 %
Eosinophils Absolute: 0.2 10*3/uL (ref 0.0–0.5)
Eosinophils Relative: 2 %
HCT: 39.5 % (ref 36.0–46.0)
Hemoglobin: 13.6 g/dL (ref 12.0–15.0)
Immature Granulocytes: 0 %
Lymphocytes Relative: 44 %
Lymphs Abs: 3.1 10*3/uL (ref 0.7–4.0)
MCH: 32.9 pg (ref 26.0–34.0)
MCHC: 34.4 g/dL (ref 30.0–36.0)
MCV: 95.4 fL (ref 80.0–100.0)
Monocytes Absolute: 0.4 10*3/uL (ref 0.1–1.0)
Monocytes Relative: 6 %
Neutro Abs: 3.3 10*3/uL (ref 1.7–7.7)
Neutrophils Relative %: 47 %
Platelets: 308 10*3/uL (ref 150–400)
RBC: 4.14 MIL/uL (ref 3.87–5.11)
RDW: 12.6 % (ref 11.5–15.5)
WBC: 7 10*3/uL (ref 4.0–10.5)
nRBC: 0 % (ref 0.0–0.2)

## 2023-07-29 LAB — BASIC METABOLIC PANEL
Anion gap: 12 (ref 5–15)
BUN: 7 mg/dL (ref 6–20)
CO2: 23 mmol/L (ref 22–32)
Calcium: 8.9 mg/dL (ref 8.9–10.3)
Chloride: 104 mmol/L (ref 98–111)
Creatinine, Ser: 0.56 mg/dL (ref 0.44–1.00)
GFR, Estimated: >60 mL/min (ref >60)
Glucose, Bld: 156 mg/dL — ABNORMAL HIGH (ref 70–99)
Potassium: 3.6 mmol/L (ref 3.5–5.1)
Sodium: 139 mmol/L (ref 135–145)

## 2023-07-29 LAB — I-STAT CHEM 8, ED
BUN: 11 mg/dL (ref 6–20)
Calcium, Ion: 1.08 mmol/L — ABNORMAL LOW (ref 1.15–1.40)
Chloride: 104 mmol/L (ref 98–111)
Creatinine, Ser: 0.6 mg/dL (ref 0.44–1.00)
Glucose, Bld: 119 mg/dL — ABNORMAL HIGH (ref 70–99)
HCT: 43 % (ref 36.0–46.0)
Hemoglobin: 14.6 g/dL (ref 12.0–15.0)
Potassium: 4.9 mmol/L (ref 3.5–5.1)
Sodium: 140 mmol/L (ref 135–145)
TCO2: 27 mmol/L (ref 22–32)

## 2023-07-29 LAB — RAPID URINE DRUG SCREEN, HOSP PERFORMED
Amphetamines: NOT DETECTED
Barbiturates: NOT DETECTED
Benzodiazepines: NOT DETECTED
Cocaine: NOT DETECTED
Opiates: NOT DETECTED
Tetrahydrocannabinol: NOT DETECTED

## 2023-07-29 LAB — CBC
HCT: 34.3 % — ABNORMAL LOW (ref 36.0–46.0)
HCT: 42.6 % (ref 36.0–46.0)
Hemoglobin: 11.4 g/dL — ABNORMAL LOW (ref 12.0–15.0)
Hemoglobin: 14.4 g/dL (ref 12.0–15.0)
MCH: 32.3 pg (ref 26.0–34.0)
MCH: 33.2 pg (ref 26.0–34.0)
MCHC: 33.2 g/dL (ref 30.0–36.0)
MCHC: 33.8 g/dL (ref 30.0–36.0)
MCV: 97.2 fL (ref 80.0–100.0)
MCV: 98.2 fL (ref 80.0–100.0)
Platelets: 232 10*3/uL (ref 150–400)
Platelets: 272 10*3/uL (ref 150–400)
RBC: 3.53 MIL/uL — ABNORMAL LOW (ref 3.87–5.11)
RBC: 4.34 MIL/uL (ref 3.87–5.11)
RDW: 12.4 % (ref 11.5–15.5)
RDW: 12.5 % (ref 11.5–15.5)
WBC: 5.8 10*3/uL (ref 4.0–10.5)
WBC: 7.1 10*3/uL (ref 4.0–10.5)
nRBC: 0 % (ref 0.0–0.2)
nRBC: 0 % (ref 0.0–0.2)

## 2023-07-29 LAB — PROTIME-INR
INR: 1 (ref 0.8–1.2)
Prothrombin Time: 13.3 s (ref 11.4–15.2)

## 2023-07-29 LAB — LIPID PANEL
Cholesterol: 147 mg/dL (ref 0–200)
HDL: 36 mg/dL — ABNORMAL LOW (ref >40)
LDL Cholesterol: 97 mg/dL (ref 0–99)
Total CHOL/HDL Ratio: 4.1 {ratio}
Triglycerides: 69 mg/dL (ref <150)
VLDL: 14 mg/dL (ref 0–40)

## 2023-07-29 LAB — GLUCOSE, CAPILLARY: Glucose-Capillary: 110 mg/dL — ABNORMAL HIGH (ref 70–99)

## 2023-07-29 LAB — MAGNESIUM: Magnesium: 1.6 mg/dL — ABNORMAL LOW (ref 1.7–2.4)

## 2023-07-29 LAB — CBG MONITORING, ED
Glucose-Capillary: 155 mg/dL — ABNORMAL HIGH (ref 70–99)
Glucose-Capillary: 138 mg/dL — ABNORMAL HIGH (ref 70–99)

## 2023-07-29 LAB — ETHANOL: Alcohol, Ethyl (B): <10 mg/dL (ref <10)

## 2023-07-29 LAB — APTT: aPTT: 32 s (ref 24–36)

## 2023-07-29 LAB — I-STAT CG4 LACTIC ACID, ED: Lactic Acid, Venous: 1.7 mmol/L (ref 0.5–1.9)

## 2023-07-29 MED ORDER — ACETAMINOPHEN 650 MG RE SUPP: 650.0000 mg | Freq: Four times a day (QID) | RECTAL | Status: DC | PRN

## 2023-07-29 MED ORDER — ONDANSETRON HCL 4 MG/2ML IJ SOLN: 4.0000 mg | Freq: Four times a day (QID) | INTRAMUSCULAR | Status: DC | PRN

## 2023-07-29 MED ORDER — ALBUTEROL SULFATE HFA 108 (90 BASE) MCG/ACT IN AERS
2.0000 | INHALATION_SPRAY | Freq: Four times a day (QID) | RESPIRATORY_TRACT | 2 refills | Status: DC | PRN
  Filled 2023-07-29: qty 18, 15d supply, fill #0

## 2023-07-29 MED ORDER — STROKE: EARLY STAGES OF RECOVERY BOOK: Freq: Once | Status: AC

## 2023-07-29 MED ORDER — AMLODIPINE BESYLATE 10 MG PO TABS
10.0000 mg | ORAL_TABLET | Freq: Every day | ORAL | Status: DC
  Administered 2023-07-29 – 2023-07-30 (×2): 10 mg via ORAL
  Filled 2023-07-29 (×2): qty 1

## 2023-07-29 MED ORDER — STROKE: EARLY STAGES OF RECOVERY BOOK
Freq: Once | Status: AC
  Filled 2023-07-29: qty 1

## 2023-07-29 MED ORDER — NICOTINE 21 MG/24HR TD PT24: 21.0000 mg | MEDICATED_PATCH | Freq: Every day | TRANSDERMAL | Status: DC

## 2023-07-29 MED ORDER — IPRATROPIUM-ALBUTEROL 0.5-2.5 (3) MG/3ML IN SOLN: 3.0000 mL | RESPIRATORY_TRACT | Status: DC | PRN

## 2023-07-29 MED ORDER — ALUM & MAG HYDROXIDE-SIMETH 200-200-20 MG/5ML PO SUSP
15.0000 mL | Freq: Once | ORAL | Status: AC
  Administered 2023-07-29: 15 mL via ORAL
  Filled 2023-07-29: qty 30

## 2023-07-29 MED ORDER — ATORVASTATIN CALCIUM 40 MG PO TABS
40.0000 mg | ORAL_TABLET | Freq: Every day | ORAL | 1 refills | Status: DC
  Filled 2023-07-29: qty 90, 90d supply, fill #0

## 2023-07-29 MED ORDER — ACETAMINOPHEN 325 MG PO TABS
650.0000 mg | ORAL_TABLET | Freq: Four times a day (QID) | ORAL | Status: DC | PRN
  Administered 2023-07-29 – 2023-07-30 (×2): 650 mg via ORAL
  Filled 2023-07-29 (×2): qty 2

## 2023-07-29 MED ORDER — ALBUTEROL SULFATE (2.5 MG/3ML) 0.083% IN NEBU: 3.0000 mL | INHALATION_SOLUTION | RESPIRATORY_TRACT | Status: DC | PRN

## 2023-07-29 MED ORDER — MELATONIN 3 MG PO TABS: 3.0000 mg | ORAL_TABLET | Freq: Every evening | ORAL | Status: DC | PRN

## 2023-07-29 MED ORDER — ORAL CARE MOUTH RINSE: 15.0000 mL | OROMUCOSAL | Status: DC | PRN

## 2023-07-29 MED ORDER — IOHEXOL 350 MG/ML SOLN
75.0000 mL | Freq: Once | INTRAVENOUS | Status: AC | PRN
  Administered 2023-07-29: 75 mL via INTRAVENOUS

## 2023-07-29 MED ORDER — INSULIN ASPART 100 UNIT/ML IJ SOLN: 0.0000 [IU] | Freq: Every day | INTRAMUSCULAR | Status: DC

## 2023-07-29 MED ORDER — ASPIRIN 81 MG PO TBEC
81.0000 mg | DELAYED_RELEASE_TABLET | Freq: Every day | ORAL | Status: DC
  Administered 2023-07-29 – 2023-07-30 (×2): 81 mg via ORAL
  Filled 2023-07-29 (×2): qty 1

## 2023-07-29 MED ORDER — INSULIN ASPART 100 UNIT/ML IJ SOLN
0.0000 [IU] | Freq: Three times a day (TID) | INTRAMUSCULAR | Status: DC
  Administered 2023-07-29: 2 [IU] via SUBCUTANEOUS
  Administered 2023-07-29: 1 [IU] via SUBCUTANEOUS
  Administered 2023-07-30: 2 [IU] via SUBCUTANEOUS

## 2023-07-29 MED ORDER — NICOTINE 21 MG/24HR TD PT24
21.0000 mg | MEDICATED_PATCH | Freq: Every day | TRANSDERMAL | Status: DC
  Administered 2023-07-29 – 2023-07-30 (×2): 21 mg via TRANSDERMAL
  Filled 2023-07-29 (×2): qty 1

## 2023-07-29 MED ORDER — CLOPIDOGREL BISULFATE 75 MG PO TABS
75.0000 mg | ORAL_TABLET | Freq: Every day | ORAL | Status: DC
  Administered 2023-07-29 – 2023-07-30 (×2): 75 mg via ORAL
  Filled 2023-07-29 (×2): qty 1

## 2023-07-29 MED ORDER — CLOPIDOGREL BISULFATE 75 MG PO TABS
75.0000 mg | ORAL_TABLET | Freq: Every day | ORAL | 0 refills | Status: AC
  Filled 2023-07-29: qty 21, 21d supply, fill #0

## 2023-07-29 MED ORDER — MAGNESIUM SULFATE 2 GM/50ML IV SOLN
2.0000 g | Freq: Once | INTRAVENOUS | Status: AC
  Administered 2023-07-29: 2 g via INTRAVENOUS
  Filled 2023-07-29: qty 50

## 2023-07-29 MED ORDER — POTASSIUM CHLORIDE CRYS ER 20 MEQ PO TBCR
40.0000 meq | EXTENDED_RELEASE_TABLET | Freq: Once | ORAL | Status: AC
  Administered 2023-07-29: 40 meq via ORAL
  Filled 2023-07-29: qty 2

## 2023-07-29 MED ORDER — ASPIRIN 81 MG PO TBEC
81.0000 mg | DELAYED_RELEASE_TABLET | Freq: Every day | ORAL | 12 refills | Status: DC
  Filled 2023-07-29: qty 30, 30d supply, fill #0

## 2023-07-29 MED ORDER — ACETAMINOPHEN 500 MG PO TABS: 1000.0000 mg | ORAL_TABLET | Freq: Three times a day (TID) | ORAL | Status: DC | PRN

## 2023-07-29 MED ORDER — INFLUENZA VIRUS VACC SPLIT PF (FLUZONE) 0.5 ML IM SUSY: 0.5000 mL | PREFILLED_SYRINGE | INTRAMUSCULAR | Status: DC

## 2023-07-29 MED ORDER — ATORVASTATIN CALCIUM 40 MG PO TABS
40.0000 mg | ORAL_TABLET | Freq: Every day | ORAL | Status: DC
  Administered 2023-07-29 – 2023-07-30 (×2): 40 mg via ORAL
  Filled 2023-07-29 (×2): qty 1

## 2023-07-29 MED ORDER — ENOXAPARIN SODIUM 40 MG/0.4ML IJ SOSY
40.0000 mg | PREFILLED_SYRINGE | INTRAMUSCULAR | Status: DC
  Administered 2023-07-29: 40 mg via SUBCUTANEOUS
  Filled 2023-07-29 (×2): qty 0.4
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
## 2023-07-29 NOTE — Evaluation (Signed)
Occupational Therapy Evaluation Patient Details Name: Peggy Warren MRN: [id number] DOB: Aug 10, 1966 Today's Date: 07/29/2023   History of Present Illness 57 y.o. female with PMH of DM-2, COPD, EtOH abuse, cocaine use, tobacco use disorder and osteoarthritis brought to ED by EMS as code stroke due to right-sided numbness including face, upper or lower extremities and with associated weakness, right facial droop and elevated blood pressure.   Clinical Impression   Patient admitted for the diagnosis above.  PTA she lives with her SO, and receives assist from him and her daughter.  Patient presents with weakness to R hemi body compared to the L side, decreased sensation to the R hemi body compared to the L side, although patient describes some of this weakness and numbness is chronic.  She is a little unsteady on her feet, needing CGA to supervision, and light Min A for ADL completion.  She is probably very close to her baseline, but OT will follow in the acute setting to address deficits and no post acute OT is anticipated.         If plan is discharge home, recommend the following: Assist for transportation;Assistance with cooking/housework;A little help with bathing/dressing/bathroom    Functional Status Assessment  Patient has had a recent decline in their functional status and demonstrates the ability to make significant improvements in function in a reasonable and predictable amount of time.  Equipment Recommendations  None recommended by OT    Recommendations for Other Services       Precautions / Restrictions Precautions Precautions: Fall Restrictions Weight Bearing Restrictions: No      Mobility Bed Mobility Overal bed mobility: Modified Independent                  Transfers Overall transfer level: Needs assistance   Transfers: Sit to/from Stand, Bed to chair/wheelchair/BSC Sit to Stand: Modified independent (Device/Increase time)     Step pivot transfers:  Supervision, Contact guard assist            Balance Overall balance assessment: Needs assistance Sitting-balance support: Feet unsupported Sitting balance-Leahy Scale: Good     Standing balance support: No upper extremity supported Standing balance-Leahy Scale: Fair Standing balance comment: limb noted                           ADL either performed or assessed with clinical judgement   ADL       Grooming: Wash/dry hands;Supervision/safety;Standing               Lower Body Dressing: Supervision/safety;Contact guard assist;Sit to/from stand   Toilet Transfer: Supervision/safety;Contact guard Actor;Ambulation                   Vision Patient Visual Report: No change from baseline       Perception Perception: Within Functional Limits       Praxis Praxis: WFL       Pertinent Vitals/Pain Pain Assessment Pain Assessment: No/denies pain     Extremity/Trunk Assessment Upper Extremity Assessment Upper Extremity Assessment: Right hand dominant;RUE deficits/detail RUE Deficits / Details: Weaker than L UE, grossly 3+/5 MMT RUE Sensation: decreased light touch RUE Coordination: WNL   Lower Extremity Assessment Lower Extremity Assessment: Defer to PT evaluation   Cervical / Trunk Assessment Cervical / Trunk Assessment: Kyphotic   Communication Communication Communication: No apparent difficulties   Cognition Arousal: Alert Behavior During Therapy: WFL for tasks assessed/performed Overall Cognitive Status: Within Functional Limits  for tasks assessed                                       General Comments       Exercises     Shoulder Instructions      Home Living Family/patient expects to be discharged to:: Private residence Living Arrangements: Spouse/significant other Available Help at Discharge: Family;Available 24 hours/day Type of Home: Apartment Home Access: Stairs to enter [employer] of Steps: 12-14 Entrance Stairs-Rails: Can reach both;Left;Right Home Layout: One level     Bathroom Shower/Tub: Chief Strategy Officer: Standard Bathroom Accessibility: Yes How Accessible: Accessible via walker Home Equipment: Rollator (4 wheels);Shower seat          Prior Functioning/Environment Prior Level of Function : Needs assist       Physical Assist : ADLs (physical)   ADLs (physical): Bathing;Dressing Mobility Comments: Patient uses 4WRW on occasion, but generally walks without an AD ADLs Comments: Patient admitted to occasional assist with bathing and dressing from daughter.  SO assists with iADL.  Patient does not drive.        OT Problem List: Impaired balance (sitting and/or standing)      OT Treatment/Interventions: Self-care/ADL training;Therapeutic activities;Balance training    OT Goals(Current goals can be found in the care plan section) Acute Rehab OT Goals Patient Stated Goal: return home OT Goal Formulation: With patient Time For Goal Achievement: 08/12/23 Potential to Achieve Goals: Good ADL Goals Pt Will Perform Grooming: Independently;standing Pt Will Perform Lower Body Dressing: Independently;sit to/from stand Pt Will Transfer to Toilet: Independently;regular height toilet;ambulating  OT Frequency: Min 1X/week    Co-evaluation              AM-PAC OT "6 Clicks" Daily Activity     Outcome Measure Help from another person eating meals?: None Help from another person taking care of personal grooming?: A Little Help from another person toileting, which includes using toliet, bedpan, or urinal?: A Little Help from another person bathing (including washing, rinsing, drying)?: A Little Help from another person to put on and taking off regular upper body clothing?: None Help from another person to put on and taking off regular lower body clothing?: A Little 6 Click Score: 20   End of Session Equipment Utilized  During Treatment: Gait belt Nurse Communication: Mobility status  Activity Tolerance: Patient tolerated treatment well Patient left: in bed;with call bell/phone within reach  OT Visit Diagnosis: Unsteadiness on feet (R26.81)                Time: 4696-2952 OT Time Calculation (min): 19 min Charges:  OT General Charges $OT Visit: 1 Visit OT Evaluation $OT Eval Moderate Complexity: 1 Mod  07/29/2023  RP, OTR/L  Acute Rehabilitation Services  Office:  [phone]   Suzanna Obey 07/29/2023, 11:31 AM

## 2023-07-29 NOTE — Plan of Care (Signed)
[facility] Tonsillectomy/Adenoidectomy, Postoperative PEDS care plan entered in error.
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
## 2023-07-29 NOTE — Progress Notes (Signed)
  Echocardiogram 2D Echocardiogram has been performed.  Milda Smart 07/29/2023, 3:54 PM

## 2023-07-29 NOTE — Evaluation (Signed)
Physical Therapy Evaluation Patient Details Name: Peggy Warren MRN: [id number] DOB: 06-01-1966 Today's Date: 07/29/2023  History of Present Illness  57 y.o. female with PMH of DM-2, COPD, EtOH abuse, cocaine use, tobacco use disorder and osteoarthritis brought to ED by EMS as code stroke due to right-sided numbness including face, upper or lower extremities and with associated weakness, right facial droop and elevated blood pressure.  Clinical Impression  Pt admitted with above diagnosis. Pt was able to ambulate with +2 mod assist a short distance with incr right LE numbness and weakness with pt with LOB. Pt reports numbness in right LE is worse than when she got up earlier.  Limited distance and made nurse aware. Will follow acutely.  Pt currently with functional limitations due to the deficits listed below (see PT Problem List). Pt will benefit from acute skilled PT to increase their independence and safety with mobility to allow discharge.           If plan is discharge home, recommend the following: A little help with walking and/or transfers;A little help with bathing/dressing/bathroom;Assistance with cooking/housework;Help with stairs or ramp for entrance;Assist for transportation   Can travel by private vehicle        Equipment Recommendations Other (comment) (TBA)  Recommendations for Other Services  Rehab consult    Functional Status Assessment Patient has had a recent decline in their functional status and demonstrates the ability to make significant improvements in function in a reasonable and predictable amount of time.     Precautions / Restrictions Precautions Precautions: Fall Restrictions Weight Bearing Restrictions: No      Mobility  Bed Mobility Overal bed mobility: Needs Assistance Bed Mobility: Supine to Sit     Supine to sit: Min assist          Transfers Overall transfer level: Needs assistance Equipment used: 2 person hand held  assist Transfers: Sit to/from Stand Sit to Stand: Min assist, +2 safety/equipment           General transfer comment: Pt was able to stand to her feet with min assist of 2.  REports pain and numbness right LE.  Pt reports numbness seems worse than earlier today when she walked to bathroom.    Ambulation/Gait Ambulation/Gait assistance: Mod assist, +2 physical assistance Gait Distance (Feet): 45 Feet Assistive device: 2 person hand held assist Gait Pattern/deviations: Step-through pattern, Decreased stride length, Decreased stance time - right, Decreased step length - right, Decreased weight shift to right, Staggering right, Wide base of support   Gait velocity interpretation: <1.31 ft/sec, indicative of household ambulator   General Gait Details: Pt needed bil HHA to ambulate as pt unsteady due to intermittent numbness right LE and inability to coordinate steps.  Pt needed mod assist of 2 with unsteadiness and needed assist with weight shifting.  Pt was able to turn and make it back to stretcher with +2 assist.  Stairs            Wheelchair Mobility     Tilt Bed    Modified Rankin (Stroke Patients Only) Modified Rankin (Stroke Patients Only) Pre-Morbid Rankin Score: Slight disability Modified Rankin: Moderately severe disability     Balance Overall balance assessment: Needs assistance Sitting-balance support: No upper extremity supported, Feet supported Sitting balance-Leahy Scale: Fair     Standing balance support: Bilateral upper extremity supported, During functional activity, Reliant on assistive device for balance Standing balance-Leahy Scale: Poor Standing balance comment: relies on UE support of 2  Pertinent Vitals/Pain Pain Assessment Pain Assessment: Faces Faces Pain Scale: Hurts even more Pain Location: right hip Pain Descriptors / Indicators: Aching, Grimacing, Guarding Pain Intervention(s): Monitored during  session, Limited activity within patient's tolerance, Repositioned    Home Living Family/patient expects to be discharged to:: Private residence Living Arrangements: Spouse/significant other Available Help at Discharge: Family;Available 24 hours/day Type of Home: Apartment Home Access: Stairs to enter Entrance Stairs-Rails: Can reach both;Left;Right Entrance Stairs-Number of Steps: 12-14   Home Layout: One level Home Equipment: Rollator (4 wheels);Shower seat      Prior Function Prior Level of Function : Needs assist       Physical Assist : ADLs (physical)   ADLs (physical): Bathing;Dressing Mobility Comments: Patient uses 4WRW on occasion, but generally walks without an AD ADLs Comments: Patient admitted to occasional assist with bathing and dressing from daughter.  SO assists with iADL.  Patient does not drive.     Extremity/Trunk Assessment   Upper Extremity Assessment Upper Extremity Assessment: Right hand dominant;RUE deficits/detail RUE Deficits / Details: Weaker than L UE, grossly 3+/5 MMT RUE Sensation: decreased light touch RUE Coordination: WNL    Lower Extremity Assessment Lower Extremity Assessment: Defer to PT evaluation RLE Deficits / Details: grossly 3-/5 RLE Sensation: decreased light touch RLE Coordination: decreased gross motor    Cervical / Trunk Assessment Cervical / Trunk Assessment: Kyphotic  Communication   Communication Communication: No apparent difficulties  Cognition Arousal: Alert Behavior During Therapy: WFL for tasks assessed/performed Overall Cognitive Status: Within Functional Limits for tasks assessed                                          General Comments General comments (skin integrity, edema, etc.): 63 bpm, 94%RA, BP 173/91 on arrival.  BP end of session 196/102. CT came to get pt at end of session    Exercises     Assessment/Plan    PT Assessment Patient needs continued PT services  PT Problem List  Decreased activity tolerance;Decreased balance;Decreased strength;Decreased mobility;Decreased coordination;Decreased knowledge of use of DME;Decreased safety awareness;Decreased knowledge of precautions;Cardiopulmonary status limiting activity       PT Treatment Interventions DME instruction;Gait training;Functional mobility training;Therapeutic activities;Therapeutic exercise;Balance training;Patient/family education    PT Goals (Current goals can be found in the Care Plan section)  Acute Rehab PT Goals Patient Stated Goal: to go home PT Goal Formulation: With patient Time For Goal Achievement: 08/12/23 Potential to Achieve Goals: Good    Frequency Min 1X/week     Co-evaluation               AM-PAC PT "6 Clicks" Mobility  Outcome Measure Help needed turning from your back to your side while in a flat bed without using bedrails?: A Little Help needed moving from lying on your back to sitting on the side of a flat bed without using bedrails?: A Little Help needed moving to and from a bed to a chair (including a wheelchair)?: A Lot Help needed standing up from a chair using your arms (e.g., wheelchair or bedside chair)?: A Lot Help needed to walk in hospital room?: Total Help needed climbing 3-5 steps with a railing? : Total 6 Click Score: 12    End of Session Equipment Utilized During Treatment: Gait belt Activity Tolerance: Patient limited by fatigue Patient left: with call bell/phone within reach (on stretcher) Nurse Communication: Mobility status PT Visit  Diagnosis: Unsteadiness on feet (R26.81);Muscle weakness (generalized) (M62.81)    Time: 7425-9563 PT Time Calculation (min) (ACUTE ONLY): 10 min   Charges:   PT Evaluation $PT Eval Moderate Complexity: 1 Mod   PT General Charges $$ ACUTE PT VISIT: 1 Visit         Gasper Hopes M,PT Acute Rehab Services [phone]   Bevelyn Buckles 07/29/2023, 11:38 AM

## 2023-07-29 NOTE — Progress Notes (Addendum)
STROKE TEAM PROGRESS NOTE   BRIEF HPI [name] is a 57 y.o. female with history of Smoker 1ppd, COPD, DM2 p/w BP of 220s systolic and concern for R facial droop that has since resolved..    INTERIM HISTORY/SUBJECTIVE States she is at her neurological baseline. No family at the bedside.  She states that she has not been taking her medications as prescribed over the last couple of weeks because she did not feel like she needed them.  We did have a discussion regarding the fact that she needs to be compliant with her medications especially her aspirin and her Plavix to prevent her from having another event. MRI shows no acute process.   OBJECTIVE  CBC    Component Value Date/Time   WBC 5.8 07/29/2023 0754   RBC 3.53 (L) 07/29/2023 0754   HGB 11.4 (L) 07/29/2023 0754   HCT 34.3 (L) 07/29/2023 0754   PLT 232 07/29/2023 0754   MCV 97.2 07/29/2023 0754   MCH 32.3 07/29/2023 0754   MCHC 33.2 07/29/2023 0754   RDW 12.5 07/29/2023 0754   LYMPHSABS 3.1 07/29/2023 0515   MONOABS 0.4 07/29/2023 0515   EOSABS 0.2 07/29/2023 0515   BASOSABS 0.0 07/29/2023 0515    BMET    Component Value Date/Time   NA 139 07/29/2023 0620   K 3.6 07/29/2023 0620   CL 104 07/29/2023 0620   CO2 23 07/29/2023 0620   GLUCOSE 156 (H) 07/29/2023 0620   BUN 7 07/29/2023 0620   CREATININE 0.56 07/29/2023 0620   CALCIUM 8.9 07/29/2023 0620   GFRNONAA >60 07/29/2023 0620    IMAGING past 24 hours CT ANGIO HEAD NECK W WO CM  Result Date: 07/29/2023 CLINICAL DATA:  Stroke/TIA, determine embolic source EXAM: CT ANGIOGRAPHY HEAD AND NECK WITH AND WITHOUT CONTRAST TECHNIQUE: Multidetector CT imaging of the head and neck was performed using the standard protocol during bolus administration of intravenous contrast. Multiplanar CT image reconstructions and MIPs were obtained to evaluate the vascular anatomy. Carotid stenosis measurements (when applicable) are obtained utilizing NASCET criteria, using the  distal internal carotid diameter as the denominator. RADIATION DOSE REDUCTION: This exam was performed according to the departmental dose-optimization program which includes automated exposure control, adjustment of the mA and/or kV according to patient size and/or use of iterative reconstruction technique. CONTRAST:  75mL OMNIPAQUE IOHEXOL 350 MG/ML SOLN COMPARISON:  Same day CT head and brain MR FINDINGS: CT HEAD FINDINGS See same day CT head for intracranial findings no contrast-enhancing lesions visualized. CTA NECK FINDINGS Aortic arch: Standard branching. Imaged portion shows no evidence of aneurysm or dissection. No significant stenosis of the major arch vessel origins. Right carotid system: No evidence of dissection, stenosis (50% or greater), or occlusion. Left carotid system: No evidence of dissection, stenosis (50% or greater), or occlusion. Vertebral arteries: Slight left dominance. No evidence of dissection, stenosis (50% or greater), or occlusion. Skeleton: Negative. Other neck: Negative. Upper chest: Negative. Review of the MIP images confirms the above findings CTA HEAD FINDINGS Anterior circulation: No significant stenosis, proximal occlusion, aneurysm, or vascular malformation. Posterior circulation: No significant stenosis, proximal occlusion, aneurysm, or vascular malformation. Venous sinuses: As permitted by contrast timing, patent. Anatomic variants: None Review of the MIP images confirms the above findings IMPRESSION: 1. No intracranial large vessel occlusion or significant stenosis. 2. No hemodynamically significant stenosis in the neck. 3. See same day CT head for intracranial findings. Electronically Signed   By: Lorenza Cambridge M.D.   On:  07/29/2023 11:48   MR BRAIN WO CONTRAST  Result Date: 07/29/2023 CLINICAL DATA:  Stroke suspected EXAM: MRI HEAD WITHOUT CONTRAST TECHNIQUE: Multiplanar, multiecho pulse sequences of the brain and surrounding structures were obtained without  intravenous contrast. COMPARISON:  No prior MRI available, correlation is made with CT head 07/29/2023 FINDINGS: Brain: No restricted diffusion to suggest acute or subacute infarct. No acute hemorrhage, mass, mass effect, or midline shift. No hydrocephalus or extra-axial collection. Normal pituitary and craniocervical junction. No hemosiderin deposition to suggest remote hemorrhage. Scattered T2 hyperintense signal in the periventricular white matter, likely the sequela of mild chronic small vessel ischemic disease. Vascular: Normal arterial flow voids. Skull and upper cervical spine: Normal marrow signal. Sinuses/Orbits: Mucosal thickening in the ethmoid air cells. No acute finding in the orbits. Other: The mastoid air cells are well aerated. IMPRESSION: No acute intracranial process. No evidence of acute or subacute infarct. These findings were discussed by telephone on 07/29/2023 at 1:05 am with provider [facility] . Electronically Signed   By: Wiliam Ke M.D.   On: 07/29/2023 01:07   CT HEAD CODE STROKE WO CONTRAST  Result Date: 07/29/2023 CLINICAL DATA:  Code stroke. EXAM: CT HEAD WITHOUT CONTRAST TECHNIQUE: Contiguous axial images were obtained from the base of the skull through the vertex without intravenous contrast. RADIATION DOSE REDUCTION: This exam was performed according to the departmental dose-optimization program which includes automated exposure control, adjustment of the mA and/or kV according to patient size and/or use of iterative reconstruction technique. COMPARISON:  06/29/2020 FINDINGS: Brain: No evidence of acute infarction, hemorrhage, mass, mass effect, or midline shift. No hydrocephalus or extra-axial collection. Partial empty sella. Normal craniocervical junction. Vascular: No hyperdense vessel. Skull: Negative for fracture or focal lesion. Sinuses/Orbits: Mucosal thickening in the ethmoid air cells. No acute finding in the orbits. Other: The mastoid air cells are well  aerated. ASPECTS [facility] Stroke Program Early CT Score) - Ganglionic level infarction (caudate, lentiform nuclei, internal capsule, insula, M1-M3 cortex): 7 - Supraganglionic infarction (M4-M6 cortex): 3 Total score (0-10 with 10 being normal): 10 IMPRESSION: 1. No acute intracranial process. 2. ASPECTS is 10. Imaging results were communicated on 07/29/2023 at 12:42 am to provider Dr. Derry Lory via secure text paging. Electronically Signed   By: Wiliam Ke M.D.   On: 07/29/2023 00:42    Vitals:   07/29/23 1145 07/29/23 1457 07/29/23 1458 07/29/23 1630  BP: (!) 198/87  (!) 148/75   Pulse: 64  (!) 56   Resp: 19  (!) 21   Temp: 97.8 F (36.6 C)   97.7 F (36.5 C)  TempSrc:    Oral  SpO2: 97% 95% 96%   Weight:         PHYSICAL EXAM General:  Alert, well-nourished, well-developed patient in no acute distress Psych:  Mood and affect appropriate for situation CV: Regular rate and rhythm on monitor Respiratory:  Regular, unlabored respirations on room air GI: Abdomen soft and nontender   NEURO:  Mental Status: AA&Ox3, patient is able to give clear and coherent history Speech/Language: speech is mildly dysarthric at baseline.  Naming, repetition, fluency, and comprehension intact.  Cranial Nerves:  II: PERRL. Visual fields full.  III, IV, VI: EOMI. Eyelids elevate symmetrically.  V: Sensation is intact to light touch and symmetrical to face.  VII: Face is symmetrical resting and smiling VIII: hearing intact to voice. IX, X: Palate elevates symmetrically. Phonation is normal.  ZO:[id number] shrug 5/5. XII: tongue is midline without fasciculations. Motor: 5/5 strength to  all muscle groups tested.  Tone: is normal and bulk is normal Sensation- Intact to light touch bilaterally. Extinction absent to light touch to DSS.   Coordination: FTN intact bilaterally, HKS: no ataxia in BLE.No drift.  Gait- deferred  ASSESSMENT/PLAN  TIA, possible left brain TIA Code Stroke CT head No acute  abnormality.  CTA head & neck No intracranial large vessel occlusion or significant stenosis. No hemodynamically significant stenosis in the neck. MRI  No acute intracranial process. No evidence of acute or subacute infarct. 2D Echo EF 60 to 65% LDL 97 HgbA1c 8.2 UDS negative VTE prophylaxis - Lovenox  No antithrombotic prior to admission, now on aspirin 81 mg daily and clopidogrel 75 mg daily for 3 weeks and then Aspirin alone. Therapy recommendations: CIR Disposition:  Pending   Hypertension Home meds: None BP high on admission, still has high BP but improved Put on amlodipine 10 Gradually normalize BP in 2 to 3 days Long-term BP goal normotensive  Hyperlipidemia Home meds:  Atorvastatin 20mg  LDL 97, goal < 70 Increased to 40mg  Continue statin at discharge  Diabetes type II Uncontrolled Home meds:  Metformin, insulin HgbA1c 8.2, goal < 7.0 CBGs SSI Recommend close follow-up with PCP for better DM control  Tobacco abuse Current smoker Smoking cessation counseling provided Nicotine patch provided Pt is willing to quit  Other acute issues COPD on home inhaler Right hip pain due to arthritis  Hospital day # 0   Patient seen and examined by NP/APP with MD. MD to update note as needed.   Elmer Picker, DNP, FNP-BC [facility] Pager: [phone]  ATTENDING NOTE: I reviewed above note and agree with the assessment and plan. Pt was seen and examined.   No family at bedside.  Patient lying in bed, slight dysarthria but pt said that is her baseline. Numbness has resolved and no significant facial asymmetry.  Right hip decreased muscle strength due to chronic right hip pain.  Otherwise neuro intact.  Mild SOB pt asking for inhaler.  MRI no acute infarct.  Patient symptoms concerning for TIA, will use multiple uncontrolled risk factors especially hypertension, hyperlipidemia, diabetes and smoking.  Put on DAPT for 3 weeks and then aspirin alone.  Increase  Lipitor from 20-40.  Aggressive risk factor modification.  Smoking cessation education provided.  PT and OT recommend CIR.  For detailed assessment and plan, please refer to above/below as I have made changes wherever appropriate.   Neurology will sign off. Please call with questions. Pt will follow up with stroke clinic NP at [address] [facility] in about 4 weeks. Thanks for the consult.   Marvel Plan, MD PhD Stroke Neurology 07/29/2023 8:30 PM    To contact Stroke Continuity provider, please refer to [email or web address]. After hours, contact General Neurology

## 2023-07-29 NOTE — ED Triage Notes (Signed)
Patient comes in as a code stroke. She states she has been feeling weaker on the right side since 2300 on 07/28/23 per EMS. She also has right facial droop when EMS arrived. The daughter said she thought her speech was slurred. Patient is alert and oriented and able to move extremities. The right leg is difficult to move and that is the side with less sensation and weakness. Patient is alert and oriented x4.. Pt has hx of HTN but does not take medications.

## 2023-07-29 NOTE — Progress Notes (Signed)
  Inpatient Rehab Admissions Coordinator :  Per therapy recommendations patient was screened for CIR candidacy by Ottie Glazier RN MSN. Patient does not appear to demonstrate the medical neccesity for a Hospital Rehabilitation /CIR admit. Noted CT and MRI negative.  Neurology suspects TIA. I will not place a Rehab Consult. Recommend other Rehab Venues to be pursued. Please contact me with any questions.  Ottie Glazier RN MSN Admissions Coordinator [phone]

## 2023-07-29 NOTE — Progress Notes (Signed)
  Carryover admission to the Day Admitter.  I discussed this case with the EDP, Dr. Alona Bene.  Per these discussions:   This is a 58 year old female with history of type 2 diabetes mellitus, who is being admitted for TIA workup after presenting with acute onset of right-sided facial numbness, right lower extremity weakness, and some expressive aphasia starting at 2300 on 07/28/2023.  All of the symptoms subsequently resolved in spontaneous fashion, and she is now without acute focal neurologic deficit.  MRI brain showed no evidence of acute stroke.  CTA head and neck without LVO.  EDP discussed patient's case with on-call neurology, Dr. Derry Lory, who recommended [facility] admission for TIA workup, and conveyed that neurology will formally consult.  I have placed an order for observation to med/tele for further evaluation management of the above.  I have placed some additional preliminary admit orders via the adult multi-morbid admission order set.  I have completed the stroke/TIA focused order set.  I have also ordered hemoglobin A1c level, lipid panel, PT/OT/ST consults.    Newton Pigg, DO Hospitalist

## 2023-07-29 NOTE — Code Documentation (Addendum)
Responded to Code Stroke called at 0018 for R sided facial droop, R sided numbness, and slurred speech, [id number]. Pt arrived at 0027, CBG-119, NIH-2 for R leg drift and R sided numbness, CT head negative for acute changes. Pt taken to MRI at 0042. MRI negative for stroke. TNK not given-not a stroke. Plan: TIA workup. Please do VS/neuro checks q2h x 12, then q4h.
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
## 2023-07-30 ENCOUNTER — Other Ambulatory Visit (HOSPITAL_COMMUNITY): Payer: Self-pay

## 2023-07-30 DIAGNOSIS — R202 Paresthesia of skin: Secondary | ICD-10-CM | POA: Diagnosis not present

## 2023-07-30 DIAGNOSIS — M1611 Unilateral primary osteoarthritis, right hip: Secondary | ICD-10-CM | POA: Diagnosis not present

## 2023-07-30 DIAGNOSIS — G459 Transient cerebral ischemic attack, unspecified: Secondary | ICD-10-CM | POA: Diagnosis not present

## 2023-07-30 DIAGNOSIS — F1090 Alcohol use, unspecified, uncomplicated: Secondary | ICD-10-CM | POA: Diagnosis not present

## 2023-07-30 LAB — RENAL FUNCTION PANEL
Albumin: 3.2 g/dL — ABNORMAL LOW (ref 3.5–5.0)
Anion gap: 14 (ref 5–15)
BUN: 7 mg/dL (ref 6–20)
CO2: 24 mmol/L (ref 22–32)
Calcium: 9.5 mg/dL (ref 8.9–10.3)
Chloride: 100 mmol/L (ref 98–111)
Creatinine, Ser: 0.62 mg/dL (ref 0.44–1.00)
GFR, Estimated: >60 mL/min (ref >60)
Glucose, Bld: 150 mg/dL — ABNORMAL HIGH (ref 70–99)
Phosphorus: 4 mg/dL (ref 2.5–4.6)
Potassium: 3.6 mmol/L (ref 3.5–5.1)
Sodium: 138 mmol/L (ref 135–145)

## 2023-07-30 LAB — CBC
HCT: 39.8 % (ref 36.0–46.0)
Hemoglobin: 13.5 g/dL (ref 12.0–15.0)
MCH: 32.7 pg (ref 26.0–34.0)
MCHC: 33.9 g/dL (ref 30.0–36.0)
MCV: 96.4 fL (ref 80.0–100.0)
Platelets: 275 10*3/uL (ref 150–400)
RBC: 4.13 MIL/uL (ref 3.87–5.11)
RDW: 12.6 % (ref 11.5–15.5)
WBC: 5.5 10*3/uL (ref 4.0–10.5)
nRBC: 0 % (ref 0.0–0.2)

## 2023-07-30 LAB — HEMOGLOBIN A1C
Hgb A1c MFr Bld: 6 % — ABNORMAL HIGH (ref 4.8–5.6)
Mean Plasma Glucose: 126 mg/dL

## 2023-07-30 LAB — MAGNESIUM: Magnesium: 2 mg/dL (ref 1.7–2.4)

## 2023-07-30 LAB — GLUCOSE, CAPILLARY: Glucose-Capillary: 187 mg/dL — ABNORMAL HIGH (ref 70–99)

## 2023-07-30 MED ORDER — AMLODIPINE BESYLATE 10 MG PO TABS
10.0000 mg | ORAL_TABLET | Freq: Every day | ORAL | 1 refills | Status: DC
  Filled 2023-07-30: qty 90, 90d supply, fill #0

## 2023-07-30 NOTE — Progress Notes (Signed)
Patient ready for discharge to home; discharge instructions given and reviewed; Rx picked up from [facility] pharmacy and given to patient. All belongings returned; patient discharged out via wheelchair; accompanied home by her daughter.

## 2023-07-30 NOTE — Discharge Summary (Signed)
Physician Discharge Summary  SYLVESTER HIGGERSON ZOX:[id number] DOB: [birth year] DOA: 07/29/2023  PCP: Saundra Shelling, NP  Admit date: 07/29/2023 Discharge date: 07/30/2023 Admitted From: Home Disposition: Home Recommendations for Outpatient Follow-up:  Follow up with PCP in 1 week Outpatient follow-up with neurology Check blood pressure, CMP and CBC at follow-up Please follow up on the following pending results: None  Home Health: HH PT Equipment/Devices: None   Discharge Condition: Stable CODE STATUS: Full code  Follow-up Information     Saundra Shelling, NP. Schedule an appointment as soon as possible for a visit in 1 week(s).   Specialty: Nurse Practitioner Contact information: [address] [place] [address] [phone]         [facility]. Schedule an appointment as soon as possible for a visit in 1 month(s).   Specialty: Neurology Why: stroke clinic Contact information: [address] [place] [address] [phone]        [facility] Follow up.   Specialty: Rehabilitation Contact information: [address] [place] [address] [phone]                Hospital course 57 y.o. female with PMH of DM-2, COPD, EtOH abuse, cocaine use, tobacco use disorder and osteoarthritis brought to ED by EMS as code stroke due to right-sided numbness including face, upper or lower extremities and with associated weakness, right facial droop and elevated blood pressure.  Patient reports intermittent left-sided numbness for about 3 years that she attributes to diabetes.  Lately, her symptoms has gotten frequent, almost about once a day.  Usually it lasts about a day and resolves.  However, her numbness was more than usual yesterday, and her blood pressure was elevated and she had to call EMS.  She denies headache,  vision change, fever, chills, chest pain, dyspnea, cough, nausea, vomiting, abdominal pain or UTI symptoms.  She denies new medications.  Currently her symptoms have improved.  She has right leg weakness that she attributes to hip arthritis.   Patient reports living with a friend.  Smokes about a pack a day.  Drinks about 2-3 beers a day.  Denies recreational drug use.  Ambulates with rolling walker.  Likes to be full code in an event of sudden cardiopulmonary arrest.   In ED, BP was elevated to 213/108 but improved 130/63.  Currently at 172/95.  Basic labs including BMP, CBC, UA and UDS without significant finding.  CT head and MRI brain without acute finding.  Evaluated by neurology who recommended routine CVA workup, aspirin and Plavix for 3 weeks followed by aspirin alone.  Admission accepted by overnight admitter, and she was seen by me as carryover.  CT angio head and neck without acute finding.  TTE with normal LVEF but G2 DD and likely from uncontrolled chronic hypertension. Blood pressure improved with addition of amlodipine.  LDL was 97, and she has been started on high intensity statin.  A1c 6.0%.  Neurology recommended Plavix and aspirin for 3 weeks followed by aspirin alone.  She is discharged on p.o. amlodipine, Lipitor, Plavix and aspirin.  She has been counseled on smoking cessation.  Home health PT ordered on discharge. Patient has been ambulating independently.   See individual problem list below for more.   Problems addressed during this hospitalization Principal Problem:   TIA (transient ischemic attack) Active Problems:   COPD (chronic obstructive pulmonary disease) (HCC)   Osteoarthritis  Physician Discharge Summary  SYLVESTER HIGGERSON ZOX:[id number] DOB: [birth year] DOA: 07/29/2023  PCP: Saundra Shelling, NP  Admit date: 07/29/2023 Discharge date: 07/30/2023 Admitted From: Home Disposition: Home Recommendations for Outpatient Follow-up:  Follow up with PCP in 1 week Outpatient follow-up with neurology Check blood pressure, CMP and CBC at follow-up Please follow up on the following pending results: None  Home Health: HH PT Equipment/Devices: None   Discharge Condition: Stable CODE STATUS: Full code  Follow-up Information     Saundra Shelling, NP. Schedule an appointment as soon as possible for a visit in 1 week(s).   Specialty: Nurse Practitioner Contact information: [address] [place] [address] [phone]         [facility]. Schedule an appointment as soon as possible for a visit in 1 month(s).   Specialty: Neurology Why: stroke clinic Contact information: [address] [place] [address] [phone]        [facility] Follow up.   Specialty: Rehabilitation Contact information: [address] [place] [address] [phone]                Hospital course 57 y.o. female with PMH of DM-2, COPD, EtOH abuse, cocaine use, tobacco use disorder and osteoarthritis brought to ED by EMS as code stroke due to right-sided numbness including face, upper or lower extremities and with associated weakness, right facial droop and elevated blood pressure.  Patient reports intermittent left-sided numbness for about 3 years that she attributes to diabetes.  Lately, her symptoms has gotten frequent, almost about once a day.  Usually it lasts about a day and resolves.  However, her numbness was more than usual yesterday, and her blood pressure was elevated and she had to call EMS.  She denies headache,  vision change, fever, chills, chest pain, dyspnea, cough, nausea, vomiting, abdominal pain or UTI symptoms.  She denies new medications.  Currently her symptoms have improved.  She has right leg weakness that she attributes to hip arthritis.   Patient reports living with a friend.  Smokes about a pack a day.  Drinks about 2-3 beers a day.  Denies recreational drug use.  Ambulates with rolling walker.  Likes to be full code in an event of sudden cardiopulmonary arrest.   In ED, BP was elevated to 213/108 but improved 130/63.  Currently at 172/95.  Basic labs including BMP, CBC, UA and UDS without significant finding.  CT head and MRI brain without acute finding.  Evaluated by neurology who recommended routine CVA workup, aspirin and Plavix for 3 weeks followed by aspirin alone.  Admission accepted by overnight admitter, and she was seen by me as carryover.  CT angio head and neck without acute finding.  TTE with normal LVEF but G2 DD and likely from uncontrolled chronic hypertension. Blood pressure improved with addition of amlodipine.  LDL was 97, and she has been started on high intensity statin.  A1c 6.0%.  Neurology recommended Plavix and aspirin for 3 weeks followed by aspirin alone.  She is discharged on p.o. amlodipine, Lipitor, Plavix and aspirin.  She has been counseled on smoking cessation.  Home health PT ordered on discharge. Patient has been ambulating independently.   See individual problem list below for more.   Problems addressed during this hospitalization Principal Problem:   TIA (transient ischemic attack) Active Problems:   COPD (chronic obstructive pulmonary disease) (HCC)   Osteoarthritis  total) by mouth daily for 21 days.   multivitamin tablet Take 1 tablet by mouth daily.   nicotine 21 mg/24hr patch Commonly known as: NICODERM CQ - dosed in mg/24 hours Place 1 patch (21 mg total) onto the skin daily.   Ventolin HFA 108 (90 Base) MCG/ACT inhaler Generic drug: albuterol Inhale 2 puffs into the lungs every 6 (six) hours as needed for wheezing or shortness of breath.   VITAMIN D-3 PO Take 1 capsule by mouth daily.        Consultations: Neurology  Procedures/Studies:   ECHOCARDIOGRAM COMPLETE  Result Date: 07/29/2023    ECHOCARDIOGRAM REPORT   Patient Name:   Royann Shivers Date of Exam: 07/29/2023 Medical Rec #:  [id number]        Height:       62.0 in Accession #:    [id number]       Weight:       173.5 lb Date of Birth:  1965-11-27        BSA:          1.800 m Patient Age:    56 years         BP:           148/75 mmHg Patient Gender: F                HR:           61 bpm. Exam Location:  Inpatient Procedure: 2D Echo, Color Doppler and Cardiac Doppler Indications:    TIA  History:        Patient has no prior history of Echocardiogram examinations.                 COPD and TIA; Risk Factors:Diabetes and Current Smoker.  ETOH,                 cocaine use.  Sonographer:    Milda Smart Referring Phys: [id number] ALLISON L ELLIS  Sonographer Comments: Patient is obese. Image acquisition challenging due to patient body habitus. IMPRESSIONS  1. Left ventricular ejection fraction, by estimation, is 60 to 65%. The left ventricle has normal function. The left ventricle has no regional wall motion abnormalities. There is mild left ventricular hypertrophy. Left ventricular diastolic parameters are consistent with Grade II diastolic dysfunction (pseudonormalization). Elevated left atrial pressure.  2. Right ventricular systolic function is normal. The right ventricular size is normal. Tricuspid regurgitation signal is inadequate for assessing PA pressure.  3. The mitral valve is normal in structure. Trivial mitral valve regurgitation. No evidence of mitral stenosis.  4. The aortic valve is tricuspid. Aortic valve regurgitation is not visualized. No aortic stenosis is present.  5. The inferior vena cava is normal in size with greater than 50% respiratory variability, suggesting right atrial pressure of 3 mmHg. FINDINGS  Left Ventricle: Left ventricular ejection fraction, by estimation, is 60 to 65%. The left ventricle has normal function. The left ventricle has no regional wall motion abnormalities. The left ventricular internal cavity size was normal in size. There is  mild left ventricular hypertrophy. Left ventricular diastolic parameters are consistent with Grade II diastolic dysfunction (pseudonormalization). Elevated left atrial pressure. Right Ventricle: The right ventricular size is normal. No increase in right ventricular wall thickness. Right ventricular systolic function is normal. Tricuspid regurgitation signal is inadequate for assessing PA pressure. Left Atrium: Left atrial size was normal in size. Right Atrium: Right atrial size was normal in size. Pericardium: There is no evidence of pericardial  total) by mouth daily for 21 days.   multivitamin tablet Take 1 tablet by mouth daily.   nicotine 21 mg/24hr patch Commonly known as: NICODERM CQ - dosed in mg/24 hours Place 1 patch (21 mg total) onto the skin daily.   Ventolin HFA 108 (90 Base) MCG/ACT inhaler Generic drug: albuterol Inhale 2 puffs into the lungs every 6 (six) hours as needed for wheezing or shortness of breath.   VITAMIN D-3 PO Take 1 capsule by mouth daily.        Consultations: Neurology  Procedures/Studies:   ECHOCARDIOGRAM COMPLETE  Result Date: 07/29/2023    ECHOCARDIOGRAM REPORT   Patient Name:   Royann Shivers Date of Exam: 07/29/2023 Medical Rec #:  [id number]        Height:       62.0 in Accession #:    [id number]       Weight:       173.5 lb Date of Birth:  1965-11-27        BSA:          1.800 m Patient Age:    56 years         BP:           148/75 mmHg Patient Gender: F                HR:           61 bpm. Exam Location:  Inpatient Procedure: 2D Echo, Color Doppler and Cardiac Doppler Indications:    TIA  History:        Patient has no prior history of Echocardiogram examinations.                 COPD and TIA; Risk Factors:Diabetes and Current Smoker.  ETOH,                 cocaine use.  Sonographer:    Milda Smart Referring Phys: [id number] ALLISON L ELLIS  Sonographer Comments: Patient is obese. Image acquisition challenging due to patient body habitus. IMPRESSIONS  1. Left ventricular ejection fraction, by estimation, is 60 to 65%. The left ventricle has normal function. The left ventricle has no regional wall motion abnormalities. There is mild left ventricular hypertrophy. Left ventricular diastolic parameters are consistent with Grade II diastolic dysfunction (pseudonormalization). Elevated left atrial pressure.  2. Right ventricular systolic function is normal. The right ventricular size is normal. Tricuspid regurgitation signal is inadequate for assessing PA pressure.  3. The mitral valve is normal in structure. Trivial mitral valve regurgitation. No evidence of mitral stenosis.  4. The aortic valve is tricuspid. Aortic valve regurgitation is not visualized. No aortic stenosis is present.  5. The inferior vena cava is normal in size with greater than 50% respiratory variability, suggesting right atrial pressure of 3 mmHg. FINDINGS  Left Ventricle: Left ventricular ejection fraction, by estimation, is 60 to 65%. The left ventricle has normal function. The left ventricle has no regional wall motion abnormalities. The left ventricular internal cavity size was normal in size. There is  mild left ventricular hypertrophy. Left ventricular diastolic parameters are consistent with Grade II diastolic dysfunction (pseudonormalization). Elevated left atrial pressure. Right Ventricle: The right ventricular size is normal. No increase in right ventricular wall thickness. Right ventricular systolic function is normal. Tricuspid regurgitation signal is inadequate for assessing PA pressure. Left Atrium: Left atrial size was normal in size. Right Atrium: Right atrial size was normal in size. Pericardium: There is no evidence of pericardial  total) by mouth daily for 21 days.   multivitamin tablet Take 1 tablet by mouth daily.   nicotine 21 mg/24hr patch Commonly known as: NICODERM CQ - dosed in mg/24 hours Place 1 patch (21 mg total) onto the skin daily.   Ventolin HFA 108 (90 Base) MCG/ACT inhaler Generic drug: albuterol Inhale 2 puffs into the lungs every 6 (six) hours as needed for wheezing or shortness of breath.   VITAMIN D-3 PO Take 1 capsule by mouth daily.        Consultations: Neurology  Procedures/Studies:   ECHOCARDIOGRAM COMPLETE  Result Date: 07/29/2023    ECHOCARDIOGRAM REPORT   Patient Name:   Royann Shivers Date of Exam: 07/29/2023 Medical Rec #:  [id number]        Height:       62.0 in Accession #:    [id number]       Weight:       173.5 lb Date of Birth:  1965-11-27        BSA:          1.800 m Patient Age:    56 years         BP:           148/75 mmHg Patient Gender: F                HR:           61 bpm. Exam Location:  Inpatient Procedure: 2D Echo, Color Doppler and Cardiac Doppler Indications:    TIA  History:        Patient has no prior history of Echocardiogram examinations.                 COPD and TIA; Risk Factors:Diabetes and Current Smoker.  ETOH,                 cocaine use.  Sonographer:    Milda Smart Referring Phys: [id number] ALLISON L ELLIS  Sonographer Comments: Patient is obese. Image acquisition challenging due to patient body habitus. IMPRESSIONS  1. Left ventricular ejection fraction, by estimation, is 60 to 65%. The left ventricle has normal function. The left ventricle has no regional wall motion abnormalities. There is mild left ventricular hypertrophy. Left ventricular diastolic parameters are consistent with Grade II diastolic dysfunction (pseudonormalization). Elevated left atrial pressure.  2. Right ventricular systolic function is normal. The right ventricular size is normal. Tricuspid regurgitation signal is inadequate for assessing PA pressure.  3. The mitral valve is normal in structure. Trivial mitral valve regurgitation. No evidence of mitral stenosis.  4. The aortic valve is tricuspid. Aortic valve regurgitation is not visualized. No aortic stenosis is present.  5. The inferior vena cava is normal in size with greater than 50% respiratory variability, suggesting right atrial pressure of 3 mmHg. FINDINGS  Left Ventricle: Left ventricular ejection fraction, by estimation, is 60 to 65%. The left ventricle has normal function. The left ventricle has no regional wall motion abnormalities. The left ventricular internal cavity size was normal in size. There is  mild left ventricular hypertrophy. Left ventricular diastolic parameters are consistent with Grade II diastolic dysfunction (pseudonormalization). Elevated left atrial pressure. Right Ventricle: The right ventricular size is normal. No increase in right ventricular wall thickness. Right ventricular systolic function is normal. Tricuspid regurgitation signal is inadequate for assessing PA pressure. Left Atrium: Left atrial size was normal in size. Right Atrium: Right atrial size was normal in size. Pericardium: There is no evidence of pericardial  Physician Discharge Summary  SYLVESTER HIGGERSON ZOX:[id number] DOB: [birth year] DOA: 07/29/2023  PCP: Saundra Shelling, NP  Admit date: 07/29/2023 Discharge date: 07/30/2023 Admitted From: Home Disposition: Home Recommendations for Outpatient Follow-up:  Follow up with PCP in 1 week Outpatient follow-up with neurology Check blood pressure, CMP and CBC at follow-up Please follow up on the following pending results: None  Home Health: HH PT Equipment/Devices: None   Discharge Condition: Stable CODE STATUS: Full code  Follow-up Information     Saundra Shelling, NP. Schedule an appointment as soon as possible for a visit in 1 week(s).   Specialty: Nurse Practitioner Contact information: [address] [place] [address] [phone]         [facility]. Schedule an appointment as soon as possible for a visit in 1 month(s).   Specialty: Neurology Why: stroke clinic Contact information: [address] [place] [address] [phone]        [facility] Follow up.   Specialty: Rehabilitation Contact information: [address] [place] [address] [phone]                Hospital course 57 y.o. female with PMH of DM-2, COPD, EtOH abuse, cocaine use, tobacco use disorder and osteoarthritis brought to ED by EMS as code stroke due to right-sided numbness including face, upper or lower extremities and with associated weakness, right facial droop and elevated blood pressure.  Patient reports intermittent left-sided numbness for about 3 years that she attributes to diabetes.  Lately, her symptoms has gotten frequent, almost about once a day.  Usually it lasts about a day and resolves.  However, her numbness was more than usual yesterday, and her blood pressure was elevated and she had to call EMS.  She denies headache,  vision change, fever, chills, chest pain, dyspnea, cough, nausea, vomiting, abdominal pain or UTI symptoms.  She denies new medications.  Currently her symptoms have improved.  She has right leg weakness that she attributes to hip arthritis.   Patient reports living with a friend.  Smokes about a pack a day.  Drinks about 2-3 beers a day.  Denies recreational drug use.  Ambulates with rolling walker.  Likes to be full code in an event of sudden cardiopulmonary arrest.   In ED, BP was elevated to 213/108 but improved 130/63.  Currently at 172/95.  Basic labs including BMP, CBC, UA and UDS without significant finding.  CT head and MRI brain without acute finding.  Evaluated by neurology who recommended routine CVA workup, aspirin and Plavix for 3 weeks followed by aspirin alone.  Admission accepted by overnight admitter, and she was seen by me as carryover.  CT angio head and neck without acute finding.  TTE with normal LVEF but G2 DD and likely from uncontrolled chronic hypertension. Blood pressure improved with addition of amlodipine.  LDL was 97, and she has been started on high intensity statin.  A1c 6.0%.  Neurology recommended Plavix and aspirin for 3 weeks followed by aspirin alone.  She is discharged on p.o. amlodipine, Lipitor, Plavix and aspirin.  She has been counseled on smoking cessation.  Home health PT ordered on discharge. Patient has been ambulating independently.   See individual problem list below for more.   Problems addressed during this hospitalization Principal Problem:   TIA (transient ischemic attack) Active Problems:   COPD (chronic obstructive pulmonary disease) (HCC)   Osteoarthritis

## 2023-07-30 NOTE — TOC Transition Note (Signed)
Transition of Care [facility]) - CM/SW Discharge Note   Patient Details  Name: Peggy Warren MRN: [id number] Date of Birth: [birth year]  [facility] CM/SW Contact:  Lawerance Sabal, RN Phone Number: 07/30/2023, 8:25 AM   Clinical Narrative:     Spoke w patient over the phone. She states that she is agreeable to [facility] services, discussed difficulty finding accepting provider due to her payor source, she states she would like OP PT and we discussed locations, referral placed to [facility], added to AVS, they will call her to set up appointment. She has needed DME at home, and she states her daughter will provide ride home.   Final next level of care: Home/Self Care Barriers to Discharge: No Barriers Identified   Patient Goals and CMS Choice      Discharge Placement                         Discharge Plan and Services Additional resources added to the After Visit Summary for                                       Social Determinants of Health (SDOH) Interventions SDOH Screenings   Food Insecurity: Food Insecurity Present (07/29/2023)  Housing: Low Risk  (07/29/2023)  Transportation Needs: Unmet Transportation Needs (07/29/2023)  Utilities: Not At Risk (07/29/2023)  Depression (PHQ2-9): Low Risk  (10/21/2020)  Tobacco Use: High Risk (07/29/2023)     Readmission Risk Interventions     No data to display

## 2023-08-25 ENCOUNTER — Ambulatory Visit (INDEPENDENT_AMBULATORY_CARE_PROVIDER_SITE_OTHER): Payer: Medicaid Other

## 2023-08-25 ENCOUNTER — Encounter: Payer: Self-pay | Admitting: Internal Medicine

## 2023-08-25 ENCOUNTER — Other Ambulatory Visit: Payer: Self-pay | Admitting: Internal Medicine

## 2023-08-25 ENCOUNTER — Ambulatory Visit: Payer: Medicaid Other | Admitting: Internal Medicine

## 2023-08-25 VITALS — BP 126/84 | HR 67 | Temp 98.3°F | Ht 61.5 in | Wt 196.0 lb

## 2023-08-25 DIAGNOSIS — R079 Chest pain, unspecified: Secondary | ICD-10-CM

## 2023-08-25 DIAGNOSIS — E559 Vitamin D deficiency, unspecified: Secondary | ICD-10-CM

## 2023-08-25 DIAGNOSIS — Z8673 Personal history of transient ischemic attack (TIA), and cerebral infarction without residual deficits: Secondary | ICD-10-CM | POA: Diagnosis not present

## 2023-08-25 DIAGNOSIS — Z0000 Encounter for general adult medical examination without abnormal findings: Secondary | ICD-10-CM

## 2023-08-25 DIAGNOSIS — E119 Type 2 diabetes mellitus without complications: Secondary | ICD-10-CM | POA: Diagnosis not present

## 2023-08-25 DIAGNOSIS — M1711 Unilateral primary osteoarthritis, right knee: Secondary | ICD-10-CM

## 2023-08-25 DIAGNOSIS — Z0001 Encounter for general adult medical examination with abnormal findings: Secondary | ICD-10-CM

## 2023-08-25 DIAGNOSIS — E538 Deficiency of other specified B group vitamins: Secondary | ICD-10-CM | POA: Diagnosis not present

## 2023-08-25 DIAGNOSIS — F17200 Nicotine dependence, unspecified, uncomplicated: Secondary | ICD-10-CM | POA: Diagnosis not present

## 2023-08-25 DIAGNOSIS — M25551 Pain in right hip: Secondary | ICD-10-CM

## 2023-08-25 DIAGNOSIS — Z1231 Encounter for screening mammogram for malignant neoplasm of breast: Secondary | ICD-10-CM

## 2023-08-25 DIAGNOSIS — Z794 Long term (current) use of insulin: Secondary | ICD-10-CM | POA: Diagnosis not present

## 2023-08-25 DIAGNOSIS — G459 Transient cerebral ischemic attack, unspecified: Secondary | ICD-10-CM

## 2023-08-25 LAB — CBC WITH DIFFERENTIAL/PLATELET
Basophils Absolute: 0.1 10*3/uL (ref 0.0–0.1)
Basophils Relative: 0.8 % (ref 0.0–3.0)
Eosinophils Absolute: 0.1 10*3/uL (ref 0.0–0.7)
Eosinophils Relative: 1.8 % (ref 0.0–5.0)
HCT: 43.3 % (ref 36.0–46.0)
Hemoglobin: 14.3 g/dL (ref 12.0–15.0)
Lymphocytes Relative: 33.2 % (ref 12.0–46.0)
Lymphs Abs: 2.2 10*3/uL (ref 0.7–4.0)
MCHC: 33.2 g/dL (ref 30.0–36.0)
MCV: 100.1 fL — ABNORMAL HIGH (ref 78.0–100.0)
Monocytes Absolute: 0.5 10*3/uL (ref 0.1–1.0)
Monocytes Relative: 7.4 % (ref 3.0–12.0)
Neutro Abs: 3.8 10*3/uL (ref 1.4–7.7)
Neutrophils Relative %: 56.8 % (ref 43.0–77.0)
Platelets: 304 10*3/uL (ref 150.0–400.0)
RBC: 4.32 Mil/uL (ref 3.87–5.11)
RDW: 13.4 % (ref 11.5–15.5)
WBC: 6.7 10*3/uL (ref 4.0–10.5)

## 2023-08-25 LAB — TSH: TSH: 2.73 u[IU]/mL (ref 0.35–5.50)

## 2023-08-25 LAB — HEMOGLOBIN A1C: Hgb A1c MFr Bld: 6.7 % — ABNORMAL HIGH (ref 4.6–6.5)

## 2023-08-25 LAB — BASIC METABOLIC PANEL
BUN: 6 mg/dL (ref 6–23)
CO2: 29 meq/L (ref 19–32)
Calcium: 9.2 mg/dL (ref 8.4–10.5)
Chloride: 105 meq/L (ref 96–112)
Creatinine, Ser: 0.56 mg/dL (ref 0.40–1.20)
GFR: 100.17 mL/min (ref >60.00)
Glucose, Bld: 83 mg/dL (ref 70–99)
Potassium: 3.8 meq/L (ref 3.5–5.1)
Sodium: 139 meq/L (ref 135–145)

## 2023-08-25 LAB — URINALYSIS, ROUTINE W REFLEX MICROSCOPIC
Bilirubin Urine: NEGATIVE
Hgb urine dipstick: NEGATIVE
Ketones, ur: NEGATIVE
Leukocytes,Ua: NEGATIVE
Nitrite: NEGATIVE
Specific Gravity, Urine: 1.025 (ref 1.000–1.030)
Total Protein, Urine: NEGATIVE
Urine Glucose: NEGATIVE
Urobilinogen, UA: 0.2 (ref 0.0–1.0)
pH: 6 (ref 5.0–8.0)

## 2023-08-25 LAB — LIPID PANEL
Cholesterol: 151 mg/dL (ref 0–200)
HDL: 48.4 mg/dL (ref >39.00)
LDL Cholesterol: 69 mg/dL (ref 0–99)
NonHDL: 103.06
Total CHOL/HDL Ratio: 3
Triglycerides: 171 mg/dL — ABNORMAL HIGH (ref 0.0–149.0)
VLDL: 34.2 mg/dL (ref 0.0–40.0)

## 2023-08-25 LAB — VITAMIN B12: Vitamin B-12: 178 pg/mL — ABNORMAL LOW (ref 211–911)

## 2023-08-25 LAB — HEPATIC FUNCTION PANEL
ALT: 25 U/L (ref 0–35)
AST: 33 U/L (ref 0–37)
Albumin: 4.4 g/dL (ref 3.5–5.2)
Alkaline Phosphatase: 68 U/L (ref 39–117)
Bilirubin, Direct: 0.2 mg/dL (ref 0.0–0.3)
Total Bilirubin: 0.9 mg/dL (ref 0.2–1.2)
Total Protein: 7.6 g/dL (ref 6.0–8.3)

## 2023-08-25 LAB — MICROALBUMIN / CREATININE URINE RATIO
Creatinine,U: 156.5 mg/dL
Microalb Creat Ratio: 2.8 mg/g (ref 0.0–30.0)
Microalb, Ur: 4.4 mg/dL — ABNORMAL HIGH (ref 0.0–1.9)

## 2023-08-25 LAB — VITAMIN D 25 HYDROXY (VIT D DEFICIENCY, FRACTURES): VITD: 30.12 ng/mL (ref 30.00–100.00)

## 2023-08-25 MED ORDER — TIRZEPATIDE 2.5 MG/0.5ML ~~LOC~~ SOAJ: 2.5000 mg | SUBCUTANEOUS | 11 refills | Status: DC

## 2023-08-25 MED ORDER — AMLODIPINE BESYLATE 10 MG PO TABS: 10.0000 mg | ORAL_TABLET | Freq: Every day | ORAL | 3 refills | Status: DC

## 2023-08-25 MED ORDER — ATORVASTATIN CALCIUM 40 MG PO TABS: 40.0000 mg | ORAL_TABLET | Freq: Every day | ORAL | 3 refills | Status: DC

## 2023-08-25 MED ORDER — VARENICLINE TARTRATE 1 MG PO TABS: 1.0000 mg | ORAL_TABLET | Freq: Two times a day (BID) | ORAL | 2 refills | Status: DC

## 2023-08-25 MED ORDER — VARENICLINE TARTRATE (STARTER) 0.5 MG X 11 & 1 MG X 42 PO TBPK: ORAL_TABLET | ORAL | 0 refills | Status: DC

## 2023-08-25 MED ORDER — ALBUTEROL SULFATE HFA 108 (90 BASE) MCG/ACT IN AERS: 2.0000 | INHALATION_SPRAY | Freq: Four times a day (QID) | RESPIRATORY_TRACT | 3 refills | Status: DC | PRN

## 2023-08-25 NOTE — Patient Instructions (Addendum)
Please remember to stop at the front desk to sign the Release of Information form  Ok to stop the Plavix (clopidogrel) since you were only to take this for 3 wks only since oct 19  Please take all new medication as prescribed  - the chantix for stopping smoking  Please take all new medication as prescribed - the mounjaro 2.5 mg weekly for 1 month, then CALL in 1 month for the higher dose if you are not having too much nausea or constipation  Please continue all other medications as before, including the aspirin  Please have the pharmacy call with any other refills you may need.  Please continue your efforts at being more active, low cholesterol diet, and weight control.  You are otherwise up to date with prevention measures today.  Please keep your appointments with your specialists as you may have planned  You will be contacted regarding the referral for: sports medicine, eye doctor, mammogram, and eye doctor  Please go to the XRAY Department in the first floor for the x-ray testing  Please go to the LAB at the blood drawing area for the tests to be done  Please make an Appointment to return in 6 months, or sooner if needed
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
## 2023-08-26 ENCOUNTER — Encounter: Payer: Self-pay | Admitting: Internal Medicine

## 2023-08-28 ENCOUNTER — Encounter: Payer: Self-pay | Admitting: Internal Medicine

## 2023-08-28 DIAGNOSIS — R079 Chest pain, unspecified: Secondary | ICD-10-CM | POA: Insufficient documentation

## 2023-08-28 DIAGNOSIS — E559 Vitamin D deficiency, unspecified: Secondary | ICD-10-CM | POA: Insufficient documentation

## 2023-08-28 DIAGNOSIS — E538 Deficiency of other specified B group vitamins: Secondary | ICD-10-CM | POA: Insufficient documentation

## 2023-08-28 DIAGNOSIS — M1711 Unilateral primary osteoarthritis, right knee: Secondary | ICD-10-CM | POA: Insufficient documentation

## 2023-08-28 DIAGNOSIS — M25551 Pain in right hip: Secondary | ICD-10-CM | POA: Insufficient documentation

## 2023-08-28 NOTE — Assessment & Plan Note (Signed)
Last vitamin D Lab Results  Component Value Date   VD25OH 30.12 08/25/2023   Low, to start oral replacement

## 2023-08-28 NOTE — Assessment & Plan Note (Addendum)
Pt counsled to quit, pt ok for Chantix x 3 mo preventive

## 2023-08-28 NOTE — Assessment & Plan Note (Signed)
With vague non discrete swelling  - likely msk strain to right upper chest after fall yesterday but non localized - for cxr

## 2023-08-28 NOTE — Assessment & Plan Note (Signed)
Mild, likely msk strain but for xray r/o fx or other

## 2023-08-28 NOTE — Assessment & Plan Note (Signed)
With mild post traumatic swelling  - for xray, r/o fx, also refer sports med

## 2023-08-28 NOTE — Assessment & Plan Note (Signed)
Age and sex appropriate education and counseling updated with regular exercise and diet Referrals for preventative services - for mammogram, refer optho Immunizations addressed - declines covid booster, for shingrix at pharmacy Smoking counseling  - counsled to quit, pt not ready Evidence for depression or other mood disorder - none significant Most recent labs reviewed. I have personally reviewed and have noted: 1) the patient's medical and social history 2) The patient's current medications and supplements 3) The patient's height, weight, and BMI have been recorded in the chart

## 2023-08-28 NOTE — Assessment & Plan Note (Addendum)
Lab Results  Component Value Date   HGBA1C 6.7 (H) 08/25/2023   uncontrolled, pt to change ozempic that she is out for 4 wks, to mounjaro 2.5 mg weekly

## 2023-08-28 NOTE — Assessment & Plan Note (Signed)
Pt with recent TIA - ok to stop plavix now, as she has finished her 3 wk course, to continue asa only and statin

## 2023-08-28 NOTE — Assessment & Plan Note (Signed)
Lab Results  Component Value Date   VITAMINB12 178 (L) 08/25/2023   Low, to start oral replacement - b12 1000 mcg qd

## 2023-09-01 ENCOUNTER — Ambulatory Visit: Payer: Medicaid Other | Admitting: Adult Health

## 2023-09-01 ENCOUNTER — Encounter: Payer: Self-pay | Admitting: Adult Health

## 2023-09-01 VITALS — BP 122/72 | HR 67 | Ht 62.0 in | Wt 193.0 lb

## 2023-09-01 DIAGNOSIS — Z794 Long term (current) use of insulin: Secondary | ICD-10-CM | POA: Diagnosis not present

## 2023-09-01 DIAGNOSIS — E785 Hyperlipidemia, unspecified: Secondary | ICD-10-CM | POA: Diagnosis not present

## 2023-09-01 DIAGNOSIS — G459 Transient cerebral ischemic attack, unspecified: Secondary | ICD-10-CM

## 2023-09-01 DIAGNOSIS — E119 Type 2 diabetes mellitus without complications: Secondary | ICD-10-CM

## 2023-09-01 NOTE — Patient Instructions (Signed)
Your Plan:  Continue ASA  Blood pressure goal <130/90 Cholesterol LDL goal <70 Diabetes goal A1c <7 Monitor diet and try to exercise   Thank you for coming to see us at [facility]. I hope we have been able to provide you high quality care today.  You may receive a patient satisfaction survey over the next few weeks. We would appreciate your feedback and comments so that we may continue to improve ourselves and the health of our patients.  
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
## 2023-09-01 NOTE — Progress Notes (Signed)
I agree with the above plan 

## 2023-09-28 ENCOUNTER — Other Ambulatory Visit: Payer: Self-pay | Admitting: Internal Medicine

## 2023-09-28 DIAGNOSIS — R921 Mammographic calcification found on diagnostic imaging of breast: Secondary | ICD-10-CM

## 2023-12-28 ENCOUNTER — Ambulatory Visit
Admission: RE | Admit: 2023-12-28 | Discharge: 2023-12-28 | Disposition: A | Discharge status: Home / Self Care | Payer: Medicaid Other | Source: Ambulatory Visit | Attending: Internal Medicine | Admitting: Internal Medicine

## 2023-12-28 DIAGNOSIS — R921 Mammographic calcification found on diagnostic imaging of breast: Secondary | ICD-10-CM

## 2024-01-04 DIAGNOSIS — M2011 Hallux valgus (acquired), right foot: Secondary | ICD-10-CM | POA: Diagnosis not present

## 2024-01-04 DIAGNOSIS — B351 Tinea unguium: Secondary | ICD-10-CM | POA: Diagnosis not present

## 2024-01-04 DIAGNOSIS — L603 Nail dystrophy: Secondary | ICD-10-CM | POA: Diagnosis not present

## 2024-01-04 DIAGNOSIS — M2012 Hallux valgus (acquired), left foot: Secondary | ICD-10-CM | POA: Diagnosis not present

## 2024-01-04 DIAGNOSIS — E1151 Type 2 diabetes mellitus with diabetic peripheral angiopathy without gangrene: Secondary | ICD-10-CM | POA: Diagnosis not present

## 2024-01-04 DIAGNOSIS — M7731 Calcaneal spur, right foot: Secondary | ICD-10-CM | POA: Diagnosis not present

## 2024-01-04 DIAGNOSIS — M7661 Achilles tendinitis, right leg: Secondary | ICD-10-CM | POA: Diagnosis not present

## 2024-01-16 ENCOUNTER — Ambulatory Visit: Admitting: Internal Medicine

## 2024-02-22 ENCOUNTER — Ambulatory Visit: Payer: Medicaid Other | Admitting: Internal Medicine

## 2024-03-14 ENCOUNTER — Encounter: Admitting: Internal Medicine

## 2024-04-27 ENCOUNTER — Other Ambulatory Visit: Payer: Self-pay | Admitting: Internal Medicine

## 2024-04-27 MED ORDER — ALBUTEROL SULFATE HFA 108 (90 BASE) MCG/ACT IN AERS: 2.0000 | INHALATION_SPRAY | Freq: Four times a day (QID) | RESPIRATORY_TRACT | 3 refills | Status: DC | PRN

## 2024-04-27 NOTE — Telephone Encounter (Signed)
 Copied from CRM [id number]. Topic: Clinical - Medication Refill >> Apr 27, 2024 11:24 AM [name] wrote: Medication: albuterol  (VENTOLIN  HFA) 108 (90 Base) MCG/ACT inhaler  Has the patient contacted their pharmacy? No (Agent: If no, request that the patient contact the pharmacy for the refill. If patient does not wish to contact the pharmacy document the reason why and proceed with request.) (Agent: If yes, when and what did the pharmacy advise?)  This is the patient's preferred pharmacy:  [id number] - [place] ([place]), [place] - [address] [place] ([place]) [place] [address] Phone: [phone] Fax: [phone]  Is this the correct pharmacy for this prescription? Yes If no, delete pharmacy and type the correct one.   Has the prescription been filled recently? No  Is the patient out of the medication? Yes  Has the patient been seen for an appointment in the last year OR does the patient have an upcoming appointment? Yes  Can we respond through MyChart? No  Agent: Please be advised that Rx refills may take up to 3 business days. We ask that you follow-up with your pharmacy.   ----------------------------------------------------------------------- From previous Reason for Contact - Medication Refill: Medication: albuterol  (VENTOLIN  HFA) 108 (90 Base) MCG/ACT inhaler  Has the patient contacted their pharmacy?   (Agent: If no, request that the patient contact the pharmacy for the refill. If patient does not wish to contact the pharmacy document the reason why and proceed with request.) (Agent: If yes, when and what did the pharmacy advise?)  This is the patient's preferred pharmacy:    Is this the correct pharmacy for this prescription?   If no, delete pharmacy and type the correct one.   Has the prescription been filled recently?    Is the patient out of the medication?    Has the patient been seen for an appointment in the last year OR  does the patient have an upcoming appointment?    Can we respond through MyChart?    Agent: Please be advised that Rx refills may take up to 3 business days. We ask that you follow-up with your pharmacy.

## 2024-05-01 ENCOUNTER — Encounter (HOSPITAL_BASED_OUTPATIENT_CLINIC_OR_DEPARTMENT_OTHER): Payer: Self-pay | Admitting: Emergency Medicine

## 2024-05-01 DIAGNOSIS — Z7982 Long term (current) use of aspirin: Secondary | ICD-10-CM | POA: Insufficient documentation

## 2024-05-01 DIAGNOSIS — M62830 Muscle spasm of back: Secondary | ICD-10-CM | POA: Insufficient documentation

## 2024-05-01 DIAGNOSIS — R109 Unspecified abdominal pain: Secondary | ICD-10-CM | POA: Insufficient documentation

## 2024-05-01 DIAGNOSIS — R918 Other nonspecific abnormal finding of lung field: Secondary | ICD-10-CM | POA: Diagnosis not present

## 2024-05-01 DIAGNOSIS — M545 Low back pain, unspecified: Secondary | ICD-10-CM | POA: Diagnosis not present

## 2024-05-01 DIAGNOSIS — R911 Solitary pulmonary nodule: Secondary | ICD-10-CM | POA: Diagnosis not present

## 2024-05-01 LAB — CBC
HCT: 42 % (ref 36.0–46.0)
Hemoglobin: 14.2 g/dL (ref 12.0–15.0)
MCH: 33.6 pg (ref 26.0–34.0)
MCHC: 33.8 g/dL (ref 30.0–36.0)
MCV: 99.5 fL (ref 80.0–100.0)
Platelets: 276 K/uL (ref 150–400)
RBC: 4.22 MIL/uL (ref 3.87–5.11)
RDW: 12.2 % (ref 11.5–15.5)
WBC: 5.9 K/uL (ref 4.0–10.5)
nRBC: 0 % (ref 0.0–0.2)

## 2024-05-01 LAB — COMPREHENSIVE METABOLIC PANEL WITH GFR
ALT: 31 U/L (ref 0–44)
AST: 72 U/L — ABNORMAL HIGH (ref 15–41)
Albumin: 4 g/dL (ref 3.5–5.0)
Alkaline Phosphatase: 87 U/L (ref 38–126)
Anion gap: 12 (ref 5–15)
BUN: 7 mg/dL (ref 6–20)
CO2: 23 mmol/L (ref 22–32)
Calcium: 9.1 mg/dL (ref 8.9–10.3)
Chloride: 104 mmol/L (ref 98–111)
Creatinine, Ser: 0.63 mg/dL (ref 0.44–1.00)
GFR, Estimated: >60 mL/min (ref >60)
Glucose, Bld: 137 mg/dL — ABNORMAL HIGH (ref 70–99)
Potassium: 3.7 mmol/L (ref 3.5–5.1)
Sodium: 140 mmol/L (ref 135–145)
Total Bilirubin: 0.4 mg/dL (ref 0.0–1.2)
Total Protein: 7.3 g/dL (ref 6.5–8.1)

## 2024-05-01 LAB — LIPASE, BLOOD: Lipase: 18 U/L (ref 11–51)

## 2024-05-01 NOTE — ED Triage Notes (Signed)
 Left side abdo/flank pain radiates to back Worse with movement X 2 days It normally goes away

## 2024-05-02 ENCOUNTER — Emergency Department (HOSPITAL_BASED_OUTPATIENT_CLINIC_OR_DEPARTMENT_OTHER): Admitting: Radiology

## 2024-05-02 ENCOUNTER — Emergency Department (HOSPITAL_BASED_OUTPATIENT_CLINIC_OR_DEPARTMENT_OTHER)

## 2024-05-02 ENCOUNTER — Emergency Department (HOSPITAL_BASED_OUTPATIENT_CLINIC_OR_DEPARTMENT_OTHER)
Admission: EM | Admit: 2024-05-02 | Discharge: 2024-05-02 | Disposition: A | Discharge status: Home / Self Care | Attending: Emergency Medicine | Admitting: Emergency Medicine

## 2024-05-02 DIAGNOSIS — K760 Fatty (change of) liver, not elsewhere classified: Secondary | ICD-10-CM | POA: Diagnosis not present

## 2024-05-02 DIAGNOSIS — R109 Unspecified abdominal pain: Secondary | ICD-10-CM | POA: Diagnosis not present

## 2024-05-02 DIAGNOSIS — K5730 Diverticulosis of large intestine without perforation or abscess without bleeding: Secondary | ICD-10-CM | POA: Diagnosis not present

## 2024-05-02 DIAGNOSIS — M549 Dorsalgia, unspecified: Secondary | ICD-10-CM

## 2024-05-02 DIAGNOSIS — R918 Other nonspecific abnormal finding of lung field: Secondary | ICD-10-CM | POA: Diagnosis not present

## 2024-05-02 DIAGNOSIS — K4090 Unilateral inguinal hernia, without obstruction or gangrene, not specified as recurrent: Secondary | ICD-10-CM | POA: Diagnosis not present

## 2024-05-02 LAB — URINALYSIS, ROUTINE W REFLEX MICROSCOPIC
Bilirubin Urine: NEGATIVE
Glucose, UA: NEGATIVE mg/dL
Hgb urine dipstick: NEGATIVE
Ketones, ur: NEGATIVE mg/dL
Leukocytes,Ua: NEGATIVE
Nitrite: NEGATIVE
Specific Gravity, Urine: 1.024 (ref 1.005–1.030)
pH: 6 (ref 5.0–8.0)

## 2024-05-02 MED ORDER — METHOCARBAMOL 500 MG PO TABS: 500.0000 mg | ORAL_TABLET | Freq: Three times a day (TID) | ORAL | 0 refills | Status: DC | PRN

## 2024-05-02 MED ORDER — HYDROCODONE-ACETAMINOPHEN 5-325 MG PO TABS: 1.0000 | ORAL_TABLET | ORAL | 0 refills | Status: DC | PRN

## 2024-05-02 MED ORDER — ONDANSETRON 4 MG PO TBDP
8.0000 mg | ORAL_TABLET | Freq: Once | ORAL | Status: AC
  Administered 2024-05-02: 8 mg via ORAL
  Filled 2024-05-02: qty 2

## 2024-05-02 MED ORDER — IOHEXOL 300 MG/ML  SOLN
75.0000 mL | Freq: Once | INTRAMUSCULAR | Status: AC | PRN
  Administered 2024-05-02: 75 mL via INTRAVENOUS

## 2024-05-02 MED ORDER — HYDROMORPHONE HCL 1 MG/ML IJ SOLN
1.0000 mg | Freq: Once | INTRAMUSCULAR | Status: AC
  Administered 2024-05-02: 1 mg via INTRAMUSCULAR
  Filled 2024-05-02: qty 1

## 2024-05-02 NOTE — ED Provider Notes (Signed)
 [place] [facility] AT [facility] Provider Note   CSN: [id number] Arrival date & time: 05/01/24  2208     Patient presents with: Abdominal Pain   Peggy Warren is a 58 y.o. female.   Presents to the emergency department for evaluation of left sided pain that has been present for 2 days.  Patient reports that most of the left side in the back hurts.  She has noticed that there is pain with movement.  Pain worsens when she coughs but she is not short of breath.  She denies any injury.  She has noticed decreased urination.       Prior to Admission medications   Medication Sig Start Date End Date Taking? Authorizing Provider  HYDROcodone -acetaminophen  (NORCO/VICODIN) 5-325 MG tablet Take 1-2 tablets by mouth every 4 (four) hours as needed for moderate pain (pain score 4-6). 05/02/24  Yes Taneika Choi, Lonni PARAS, MD  methocarbamol  (ROBAXIN ) 500 MG tablet Take 1 tablet (500 mg total) by mouth every 8 (eight) hours as needed for muscle spasms. 05/02/24  Yes Mileidy Atkin, Lonni PARAS, MD  acetaminophen  (TYLENOL ) 500 MG tablet Take 2 tablets (1,000 mg total) by mouth every 8 (eight) hours as needed. 07/29/23   Gonfa, Taye T, MD  albuterol  (VENTOLIN  HFA) 108 (90 Base) MCG/ACT inhaler Inhale 2 puffs into the lungs every 6 (six) hours as needed for wheezing or shortness of breath. 04/27/24   [name], MD  amLODipine  (NORVASC ) 10 MG tablet Take 1 tablet (10 mg total) by mouth daily. 08/25/23   [name], MD  aspirin  EC 81 MG tablet Take 1 tablet (81 mg total) by mouth daily. Swallow whole. 07/30/23   Gonfa, Taye T, MD  atorvastatin  (LIPITOR) 40 MG tablet Take 1 tablet (40 mg total) by mouth daily. 08/25/23   [name], MD  Cholecalciferol (VITAMIN D -3 PO) Take 1 capsule by mouth daily.    [provider]  Cyanocobalamin  (VITAMIN B-12 PO) Take by mouth daily.    [provider]  Multiple Vitamin (MULTIVITAMIN) tablet Take 1 tablet by mouth daily.     [provider]  tirzepatide  (MOUNJARO ) 2.5 MG/0.5ML Pen Inject 2.5 mg into the skin once a week. Patient not taking: Reported on 09/01/2023 08/25/23   [name], MD  varenicline  (CHANTIX  CONTINUING MONTH PAK) 1 MG tablet Take 1 tablet (1 mg total) by mouth 2 (two) times daily. Patient not taking: Reported on 09/01/2023 08/25/23   [name], MD  Varenicline  Tartrate, Starter, (CHANTIX  STARTING MONTH PAK) 0.5 MG X 11 & 1 MG X 42 TBPK Take 0.5 mg twice daily by mouth x 11 days, then 1 mg twice daily Patient not taking: Reported on 09/01/2023 08/25/23   [name], MD    Allergies: Patient has no known allergies.    Review of Systems  Updated Vital Signs BP (!) 145/68   Pulse 62   Temp 98.2 F (36.8 C) (Oral)   Resp 20   LMP 06/24/2012   SpO2 92%   Physical Exam Vitals and nursing note reviewed.  Constitutional:      General: She is not in acute distress.    Appearance: She is well-developed.  HENT:     Head: Normocephalic and atraumatic.     Mouth/Throat:     Mouth: Mucous membranes are moist.  Eyes:     General: Vision grossly intact. Gaze aligned appropriately.     Extraocular Movements: Extraocular movements intact.  Conjunctiva/sclera: Conjunctivae normal.  Cardiovascular:     Rate and Rhythm: Normal rate and regular rhythm.     Pulses: Normal pulses.     Heart sounds: Normal heart sounds, S1 normal and S2 normal. No murmur heard.    No friction rub. No gallop.  Pulmonary:     Effort: Pulmonary effort is normal. No respiratory distress.     Breath sounds: Normal breath sounds.  Abdominal:     General: Bowel sounds are normal.     Palpations: Abdomen is soft.     Tenderness: There is no abdominal tenderness. There is no guarding or rebound.     Hernia: No hernia is present.  Musculoskeletal:        General: No swelling.     Cervical back: Full passive range of motion without pain, normal range of motion and neck supple. No spinous process  tenderness or muscular tenderness. Normal range of motion.     Thoracic back: Spasms and tenderness present.     Lumbar back: Spasms present.       Back:     Right lower leg: No edema.     Left lower leg: No edema.  Skin:    General: Skin is warm and dry.     Capillary Refill: Capillary refill takes less than 2 seconds.     Findings: No ecchymosis, erythema, rash or wound.  Neurological:     General: No focal deficit present.     Mental Status: She is alert and oriented to person, place, and time.     GCS: GCS eye subscore is 4. GCS verbal subscore is 5. GCS motor subscore is 6.     Cranial Nerves: Cranial nerves 2-12 are intact.     Sensory: Sensation is intact.     Motor: Motor function is intact.     Coordination: Coordination is intact.  Psychiatric:        Attention and Perception: Attention normal.        Mood and Affect: Mood normal.        Speech: Speech normal.        Behavior: Behavior normal.     (all labs ordered are listed, but only abnormal results are displayed) Labs Reviewed  COMPREHENSIVE METABOLIC PANEL WITH GFR - Abnormal; Notable for the following components:      Result Value   Glucose, Bld 137 (*)    AST 72 (*)    All other components within normal limits  URINALYSIS, ROUTINE W REFLEX MICROSCOPIC - Abnormal; Notable for the following components:   Protein, ur TRACE (*)    All other components within normal limits  LIPASE, BLOOD  CBC    EKG: None  Radiology: CT CHEST W CONTRAST Result Date: 05/02/2024 CLINICAL DATA:  Patient originally was evaluated for left abdominal/flank pain radiating to the back worsening with movement x2 days, screening PA and lateral chest demonstrated a perihilar 2.5 cm right upper lobe anterior segment mass. EXAM: CT CHEST WITH CONTRAST TECHNIQUE: Multidetector CT imaging of the chest was performed during intravenous contrast administration. RADIATION DOSE REDUCTION: This exam was performed according to the departmental  dose-optimization program which includes automated exposure control, adjustment of the mA and/or kV according to patient size and/or use of iterative reconstruction technique. CONTRAST:  75mL OMNIPAQUE  IOHEXOL  300 MG/ML  SOLN COMPARISON:  Renal stone CT today showing no acute noncontrast CT findings, PA and lateral chest today, PA and lateral chest 08/25/2023, and chest CT with contrast 06/30/2020. FINDINGS: Cardiovascular:  The cardiac size is normal. There are trace single-vessel calcifications LAD coronary artery. There is no pericardial effusion. Pulmonary arteries are normal caliber and centrally clear. The pulmonary veins are normal. There is atherosclerosis in the aortic arch great vessels and mild aortic tortuosity. There is no aneurysm, stenosis or dissection. Mediastinum/Nodes: Slightly prominent right upper hilar lymph node 1.1 cm in short axis on 2:45. Slightly prominent precarinal lymph node measuring 1 cm on 2:44. There are no other enlarged lymph nodes. The lower poles of the thyroid  gland, both axillary spaces, thoracic trachea and thoracic esophagus are unremarkable. Both main bronchi are clear. Lungs/Pleura: The lungs are mildly emphysematous the centrilobular changes predominating. There is a spiculated solid perihilar mass in the anterior segment of the right upper lobe measuring 2.7 x 2.3 x 2.1 cm, with the medial branch of the anterior segmental bronchus extending to the mass undersurface. There are radiating spiculations including to the anterolateral pleural surface and medially to the mediastinal border. On coronal reformatting the mass appears to tether the underlying horizontal fissure upward. There are several underlying subsegmental small airway bronchial impactions in the more inferior anterior segment. This is almost certain to be a bronchogenic malignancy. PET-CT or tissue sampling recommended at this time. No infiltrate or further masses or nodules are seen. There is no pleural  effusion. Upper Abdomen: The visualized liver is moderately steatotic without mass enhancement. No acute upper abdominal findings. No adrenal mass. Musculoskeletal: No regional bone metastasis is seen. Moderate thoracic spondylosis. Unremarkable visualized chest wall. IMPRESSION: 1. 2.7 x 2.3 x 2.1 cm spiculated perihilar mass in the anterior segment of the right upper lobe, almost certain to be a bronchogenic malignancy. PET-CT or tissue sampling recommended at this time. 2. Slightly prominent right upper hilar and precarinal lymph nodes. No other enlarged lymph nodes. No pleural effusion or appreciable regional bone metastasis. 3. Emphysema. 4. Aortic and coronary artery atherosclerosis. 5. Hepatic steatosis. Aortic Atherosclerosis (ICD10-I70.0) and Emphysema (ICD10-J43.9). Electronically Signed   By: Francis Quam M.D.   On: 05/02/2024 04:31   CT Renal Stone Study Result Date: 05/02/2024 EXAM: CT ABDOMEN AND PELVIS WITHOUT CONTRAST 05/02/2024 03:16:52 AM TECHNIQUE: CT of the abdomen and pelvis was performed without the administration of intravenous contrast. Multiplanar reformatted images are provided for review. Automated exposure control, iterative reconstruction, and/or weight based adjustment of the mA/kV was utilized to reduce the radiation dose to as low as reasonably achievable. COMPARISON: 06/30/2020 CLINICAL HISTORY: Left side abdominal/flank pain radiates to back, worse with movement for 2 days, suspected stone. FINDINGS: LOWER CHEST: No acute abnormality. LIVER: Moderate hepatic steatosis. GALLBLADDER AND BILE DUCTS: Gallbladder is unremarkable. No biliary ductal dilatation. SPLEEN: No acute abnormality. PANCREAS: No acute abnormality. ADRENAL GLANDS: No acute abnormality. KIDNEYS, URETERS AND BLADDER: No stones in the kidneys or ureters. No hydronephrosis. No perinephric or periureteral stranding. Urinary bladder is unremarkable. GI AND BOWEL: Mild left colonic diverticulosis, without evidence of  diverticulitis. Normal appendix (image 41). Stomach demonstrates no acute abnormality. There is no bowel obstruction. No bowel wall thickening. PERITONEUM AND RETROPERITONEUM: No ascites. No free air. VASCULATURE: Aorta is normal in caliber. LYMPH NODES: No lymphadenopathy. REPRODUCTIVE ORGANS: Uterus is within normal limits. BONES AND SOFT TISSUES: Tiny fat-containing left inguinal hernia. No acute osseous abnormality. IMPRESSION: 1. No acute findings in the abdomen or pelvis. 2. Mild left colonic diverticulosis, without evidence of diverticulitis. 3. Moderate hepatic steatosis. Electronically signed by: Pinkie Pebbles MD 05/02/2024 03:30 AM EDT RP Workstation: [id number]   DG Chest 2  View Result Date: 05/02/2024 EXAM: 2 VIEW(S) XRAY OF THE CHEST 05/02/2024 03:16:26 AM COMPARISON: 08/25/2023 CLINICAL HISTORY: Left side abdo/flank pain radiates to back; Worse with movement; X 2 days; It normally goes away FINDINGS: LUNGS AND PLEURA: 2.5 cm nodular opacity in the right mid lung, likely in the right upper lobe when correlating with the lateral view, suspicious for primary bronchogenic carcinoma. No pulmonary edema. No pleural effusion. No pneumothorax. HEART AND MEDIASTINUM: No acute abnormality of the cardiac and mediastinal silhouettes. BONES AND SOFT TISSUES: No acute osseous abnormality. IMPRESSION: 1. 2.5 cm nodular opacity in the right mid lung, likely in the right upper lobe, suspicious for primary bronchogenic carcinoma. CT chest with contrast is suggested for further evaluation. Electronically signed by: Pinkie Pebbles MD 05/02/2024 03:27 AM EDT RP Workstation: [id number]     Procedures   Medications Ordered in the ED  HYDROmorphone  (DILAUDID ) injection 1 mg (1 mg Intramuscular Given 05/02/24 0325)  ondansetron  (ZOFRAN -ODT) disintegrating tablet 8 mg (8 mg Oral Given 05/02/24 0325)  iohexol  (OMNIPAQUE ) 300 MG/ML solution 75 mL (75 mLs Intravenous Contrast Given 05/02/24 0351)                                     Medical Decision Making Amount and/or Complexity of Data Reviewed Labs: ordered. Decision-making details documented in ED Course. Radiology: ordered and independent interpretation performed. Decision-making details documented in ED Course.  Risk Prescription drug management.   Differential Diagnosis considered includes, but not limited to: Renal colic/kidney stone; pyelonephritis; aortic dissection; musculoskeletal pain.  Presents to the emergency department for evaluation of flank pain.  Patient does have elements that seem very musculoskeletal in nature.  There is increased pain with range of motion, spasm on exam and tenderness.  No radicular component.  Normal strength and sensation in lower extremities.  Patient has normal urine, no sign of infection.  CT renal study does not show acute abnormality.  X-ray with concerning lesion in the right upper lobe.  Patient sent back to radiology for CT scan.  Findings concerning for bronchogenic carcinoma.  Referral to pulmonology placed.  Findings and concerns discussed with patient.     Final diagnoses:  Acute left-sided back pain, unspecified back location  Lung mass    ED Discharge Orders          Ordered    Ambulatory referral to Pulmonology        05/02/24 0437    HYDROcodone -acetaminophen  (NORCO/VICODIN) 5-325 MG tablet  Every 4 hours PRN        05/02/24 0447    methocarbamol  (ROBAXIN ) 500 MG tablet  Every 8 hours PRN        05/02/24 0447               Haze Lonni PARAS, MD 05/02/24 [phone]

## 2024-05-04 ENCOUNTER — Ambulatory Visit (INDEPENDENT_AMBULATORY_CARE_PROVIDER_SITE_OTHER): Admitting: Student in an Organized Health Care Education/Training Program

## 2024-05-04 ENCOUNTER — Encounter: Payer: Self-pay | Admitting: Student in an Organized Health Care Education/Training Program

## 2024-05-04 ENCOUNTER — Telehealth: Payer: Self-pay

## 2024-05-04 VITALS — BP 128/70 | HR 75 | Temp 97.3°F | Ht 62.0 in | Wt 192.0 lb

## 2024-05-04 DIAGNOSIS — R918 Other nonspecific abnormal finding of lung field: Secondary | ICD-10-CM

## 2024-05-04 DIAGNOSIS — J438 Other emphysema: Secondary | ICD-10-CM | POA: Diagnosis not present

## 2024-05-04 MED ORDER — UMECLIDINIUM-VILANTEROL 62.5-25 MCG/ACT IN AEPB: 1.0000 | INHALATION_SPRAY | Freq: Every day | RESPIRATORY_TRACT | 12 refills | Status: DC
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
## 2024-05-04 NOTE — Progress Notes (Signed)
 Please schedule the following:  Provider performing procedure:Vincent Streater Diagnosis: lung mass Which side if for nodule / mass? right Procedure: robotic assisted navigation, EBUS  Has patient been spoken to by Provider and given informed consent? yes Anesthesia: general Do you need Fluro? Yes, 3D Duration of procedure: 90 minutes Date: 05/10/2024 Location: [facility] Does patient have OSA? no DM? no Or Latex allergy? no Medication Restriction/ Anticoagulate/Antiplatelet: no Pre-op Labs Ordered:determined by Anesthesia Imaging request: ordered  (If, SuperDimension CT Chest, please have STAT courier sent to ENDO)
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
## 2024-05-04 NOTE — Telephone Encounter (Signed)
 Robotic Bronchoscopy with EBUS 05/10/2024 at 7:30am [facility] Lung Nodule [id number], [id number], [phone]

## 2024-05-04 NOTE — Telephone Encounter (Signed)
 For the codes [address]372, [phone], [phone] Prior Auth Not Required as long as it is performed in outpatient setting

## 2024-05-04 NOTE — Telephone Encounter (Signed)
 Noted. Nothing further needed.

## 2024-05-07 ENCOUNTER — Ambulatory Visit: Admitting: Pulmonary Disease

## 2024-05-07 DIAGNOSIS — R918 Other nonspecific abnormal finding of lung field: Secondary | ICD-10-CM

## 2024-05-07 LAB — PULMONARY FUNCTION TEST
DL/VA % pred: 87 %
DL/VA: 3.75 ml/min/mmHg/L
DLCO cor % pred: 63 %
DLCO cor: 12.01 ml/min/mmHg
DLCO unc % pred: 64 %
DLCO unc: 12.3 ml/min/mmHg
FEF 25-75 Post: 0.88 L/s
FEF 25-75 Pre: 0.68 L/s
FEF2575-%Change-Post: 29 %
FEF2575-%Pred-Post: 38 %
FEF2575-%Pred-Pre: 29 %
FEV1-%Change-Post: 5 %
FEV1-%Pred-Post: 54 %
FEV1-%Pred-Pre: 51 %
FEV1-Post: 1.31 L
FEV1-Pre: 1.24 L
FEV1FVC-%Change-Post: 0 %
FEV1FVC-%Pred-Pre: 86 %
FEV6-%Change-Post: 6 %
FEV6-%Pred-Post: 63 %
FEV6-%Pred-Pre: 59 %
FEV6-Post: 1.91 L
FEV6-Pre: 1.79 L
FEV6FVC-%Change-Post: 0 %
FEV6FVC-%Pred-Post: 101 %
FEV6FVC-%Pred-Pre: 101 %
FVC-%Change-Post: 6 %
FVC-%Pred-Post: 62 %
FVC-%Pred-Pre: 58 %
FVC-Post: 1.94 L
FVC-Pre: 1.82 L
Post FEV1/FVC ratio: 68 %
Post FEV6/FVC ratio: 98 %
Pre FEV1/FVC ratio: 68 %
Pre FEV6/FVC Ratio: 98 %
RV % pred: 120 %
RV: 2.25 L
TLC % pred: 86 %
TLC: 4.13 L

## 2024-05-07 NOTE — Progress Notes (Signed)
 Full pft performed today.

## 2024-05-07 NOTE — Patient Instructions (Signed)
 Full pft performed today.

## 2024-05-08 ENCOUNTER — Other Ambulatory Visit: Payer: Self-pay

## 2024-05-08 ENCOUNTER — Encounter (HOSPITAL_COMMUNITY): Payer: Self-pay | Admitting: Student in an Organized Health Care Education/Training Program

## 2024-05-08 NOTE — Progress Notes (Signed)
 PCP - Dr. Lynwood Rush Cardiologist - denies  PPM/ICD - denies   Chest x-ray - 05/02/24 EKG - 07/29/23 Stress Test - denies ECHO - 07/29/23 Cardiac Cath - denies  CPAP - denies  DM- pt states she does not check CBG at home and does not know fasting levels  Blood Thinner Instructions: n/a Aspirin  Instructions: f/u with surgeon  ERAS Protcol - no, NPO  COVID TEST- n/a  Anesthesia review: yes, recent ED visit  Patient verbally denies any shortness of breath, fever, cough and chest pain during phone call      Questions were answered. Patient verbalized understanding of instructions.

## 2024-05-08 NOTE — Pre-Procedure Instructions (Signed)
-------------    SDW INSTRUCTIONS given:  Your procedure is scheduled on 7/31.  Report to [facility] Main Entrance A at 05:30 A.M., and check in at the Admitting office.  Any questions or running late day of surgery: call [phone]    Remember:  Do not eat or drink after midnight the night before your surgery     Take these medicines the morning of surgery with A SIP OF WATER  tylenol , albuterol  (VENTOLIN  HFA), amLODipine  (NORVASC ), atorvastatin  (LIPITOR), HYDROcodone -acetaminophen  (NORCO/VICODIN), methocarbamol  (ROBAXIN ), umeclidinium-vilanterol (ANORO ELLIPTA )     Follow your surgeon's instructions on when to stop Aspirin .  If no instructions were given by your surgeon then you will need to call the office to get those instructions.    As of today, STOP taking any Aleve , Naproxen , Ibuprofen , Motrin , Advil , Goody's, BC's, all herbal medications, fish oil, and all vitamins.   Do NOT Smoke (Tobacco/Vaping) 24 hours prior to your procedure  If you use a CPAP at night, you may bring all equipment for your overnight stay.     You will be asked to remove any contacts, glasses, piercing's, hearing aid's, dentures/partials prior to surgery. Please bring cases for these items if needed.     Patients discharged the day of surgery will not be allowed to drive home, and someone needs to stay with them for 24 hours.  SURGICAL WAITING ROOM VISITATION Patients may have no more than 2 support people in the waiting area - these visitors may rotate.   Pre-op nurse will coordinate an appropriate time for 1 ADULT support person, who may not rotate, to accompany patient in pre-op.  Children under the age of 63 must have an adult with them who is not the patient and must remain in the main waiting area with an adult.  If the patient needs to stay at the hospital during part of their recovery, the visitor guidelines for inpatient rooms apply.  Please refer to the [facility] website for the  visitor guidelines for any additional information.   Special instructions:   [place]- Preparing For Surgery   Please follow these instructions carefully.   Shower the NIGHT BEFORE SURGERY and the MORNING OF SURGERY with DIAL Soap.   Pat yourself dry with a CLEAN TOWEL.  Wear CLEAN PAJAMAS to bed the night before surgery  Place CLEAN SHEETS on your bed the night of your first shower and DO NOT SLEEP WITH PETS.   Additional instructions for the day of surgery: DO NOT APPLY any lotions, deodorants, cologne, or perfumes.   Do not wear jewelry or makeup Do not wear nail polish, gel polish, artificial nails, or any other type of covering on natural nails (fingers and toes) Do not bring valuables to the hospital. [facility] is not responsible for valuables/personal belongings. Put on clean/comfortable clothes.  Please brush your teeth.  Ask your nurse before applying any prescription medications to the skin.

## 2024-05-09 ENCOUNTER — Ambulatory Visit: Admitting: Student in an Organized Health Care Education/Training Program

## 2024-05-09 ENCOUNTER — Ambulatory Visit (INDEPENDENT_AMBULATORY_CARE_PROVIDER_SITE_OTHER): Admitting: Internal Medicine

## 2024-05-09 ENCOUNTER — Encounter: Payer: Self-pay | Admitting: Internal Medicine

## 2024-05-09 VITALS — BP 140/82 | HR 69 | Temp 98.2°F | Ht 62.0 in | Wt 190.8 lb

## 2024-05-09 DIAGNOSIS — F17200 Nicotine dependence, unspecified, uncomplicated: Secondary | ICD-10-CM | POA: Diagnosis not present

## 2024-05-09 DIAGNOSIS — M25551 Pain in right hip: Secondary | ICD-10-CM | POA: Diagnosis not present

## 2024-05-09 DIAGNOSIS — I2510 Atherosclerotic heart disease of native coronary artery without angina pectoris: Secondary | ICD-10-CM | POA: Diagnosis not present

## 2024-05-09 DIAGNOSIS — E119 Type 2 diabetes mellitus without complications: Secondary | ICD-10-CM | POA: Diagnosis not present

## 2024-05-09 DIAGNOSIS — E559 Vitamin D deficiency, unspecified: Secondary | ICD-10-CM

## 2024-05-09 DIAGNOSIS — R918 Other nonspecific abnormal finding of lung field: Secondary | ICD-10-CM

## 2024-05-09 DIAGNOSIS — Z794 Long term (current) use of insulin: Secondary | ICD-10-CM | POA: Diagnosis not present

## 2024-05-09 DIAGNOSIS — R9431 Abnormal electrocardiogram [ECG] [EKG]: Secondary | ICD-10-CM | POA: Diagnosis not present

## 2024-05-09 DIAGNOSIS — E538 Deficiency of other specified B group vitamins: Secondary | ICD-10-CM | POA: Diagnosis not present

## 2024-05-09 MED ORDER — AMLODIPINE BESYLATE 10 MG PO TABS: 10.0000 mg | ORAL_TABLET | Freq: Every day | ORAL | 3 refills | Status: DC

## 2024-05-09 MED ORDER — VARENICLINE TARTRATE 1 MG PO TABS: 1.0000 mg | ORAL_TABLET | Freq: Two times a day (BID) | ORAL | 2 refills | Status: DC

## 2024-05-09 MED ORDER — ATORVASTATIN CALCIUM 40 MG PO TABS: 40.0000 mg | ORAL_TABLET | Freq: Every day | ORAL | 3 refills | Status: DC

## 2024-05-09 MED ORDER — VARENICLINE TARTRATE (STARTER) 0.5 MG X 11 & 1 MG X 42 PO TBPK: ORAL_TABLET | ORAL | 0 refills | Status: DC

## 2024-05-09 NOTE — Anesthesia Preprocedure Evaluation (Signed)
 Anesthesia Evaluation  Patient identified by MRN, date of birth, ID band Patient awake    Reviewed: Allergy & Precautions, NPO status , Patient's Chart, lab work & pertinent test results  Airway Mallampati: II  TM Distance: >3 FB Neck ROM: Full    Dental  (+) Dental Advisory Given   Pulmonary COPD, Current Smoker   breath sounds clear to auscultation       Cardiovascular + CAD   Rhythm:Regular Rate:Normal     Neuro/Psych TIA   GI/Hepatic Neg liver ROS,GERD  ,,  Endo/Other  diabetes, Type 2    Renal/GU negative Renal ROS     Musculoskeletal  (+) Arthritis ,    Abdominal   Peds  Hematology negative hematology ROS (+)   Anesthesia Other Findings   Reproductive/Obstetrics                              Anesthesia Physical Anesthesia Plan  ASA: 3  Anesthesia Plan: General   Post-op Pain Management: Tylenol  PO (pre-op)*   Induction: Intravenous  PONV Risk Score and Plan: 2 and Dexamethasone , Ondansetron  and Treatment may vary due to age or medical condition  Airway Management Planned: Oral ETT  Additional Equipment:   Intra-op Plan:   Post-operative Plan: Extubation in OR  Informed Consent: I have reviewed the patients History and Physical, chart, labs and discussed the procedure including the risks, benefits and alternatives for the proposed anesthesia with the patient or authorized representative who has indicated his/her understanding and acceptance.     Dental advisory given  Plan Discussed with: CRNA  Anesthesia Plan Comments: (  )         Anesthesia Quick Evaluation

## 2024-05-09 NOTE — Assessment & Plan Note (Signed)
 Lab Results  Component Value Date   HGBA1C 6.7 (H) 08/25/2023   Stable, pt to continue current medical treatment mounjaro  2.5 mg weekly, declines lab today as already scheduled for other lab testing soon

## 2024-05-09 NOTE — Assessment & Plan Note (Signed)
 Lab Results  Component Value Date   VITAMINB12 178 (L) 08/25/2023   Low, to start oral replacement - b12 1000 mcg qd

## 2024-05-09 NOTE — Patient Instructions (Addendum)
 MyChart Username [id number]  Your new password can be reset with the Link that was texted to your phone  Please continue all other medications as before, and refills have been done if requested.  Please have the pharmacy call with any other refills you may need.  Please keep your appointments with your specialists as you may have planned - lung biopsy tomorrow, and PET scan on Friday  We can hold on lab testing today  You are given the scripts on paper for Cane, and Shower chair, to take to [facility] on [address] or other similar store  You will be contacted regarding the referral for: Orthopedic for the severe right hip arthritis  You will be contacted regarding the referral for: Cardiac CT score (which can be done some later than everything else)  Please make an Appointment to return in 6 months, or sooner if needed

## 2024-05-09 NOTE — Assessment & Plan Note (Signed)
 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

## 2024-05-09 NOTE — Assessment & Plan Note (Addendum)
 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

## 2024-05-09 NOTE — Assessment & Plan Note (Signed)
 Last vitamin D  Lab Results  Component Value Date   VD25OH 30.12 08/25/2023   Low to start oral replacement
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
## 2024-05-09 NOTE — Assessment & Plan Note (Signed)
 Also for Card CT score to assess
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
## 2024-05-09 NOTE — Assessment & Plan Note (Signed)
 Pt to quit - ok to start chantix  starter kit

## 2024-05-10 ENCOUNTER — Other Ambulatory Visit: Payer: Self-pay

## 2024-05-10 ENCOUNTER — Ambulatory Visit (HOSPITAL_COMMUNITY)
Admission: RE | Admit: 2024-05-10 | Discharge: 2024-05-10 | Disposition: A | Discharge status: Home / Self Care | Attending: Student in an Organized Health Care Education/Training Program | Admitting: Student in an Organized Health Care Education/Training Program

## 2024-05-10 ENCOUNTER — Ambulatory Visit (HOSPITAL_COMMUNITY): Payer: Self-pay | Admitting: Physician Assistant

## 2024-05-10 ENCOUNTER — Ambulatory Visit (HOSPITAL_COMMUNITY)

## 2024-05-10 ENCOUNTER — Ambulatory Visit (HOSPITAL_BASED_OUTPATIENT_CLINIC_OR_DEPARTMENT_OTHER): Payer: Self-pay | Admitting: Physician Assistant

## 2024-05-10 ENCOUNTER — Encounter (HOSPITAL_COMMUNITY): Payer: Self-pay | Admitting: Student in an Organized Health Care Education/Training Program

## 2024-05-10 ENCOUNTER — Encounter (HOSPITAL_COMMUNITY)
Admission: RE | Disposition: A | Discharge status: Home / Self Care | Payer: Self-pay | Source: Home / Self Care | Attending: Student in an Organized Health Care Education/Training Program

## 2024-05-10 DIAGNOSIS — R918 Other nonspecific abnormal finding of lung field: Secondary | ICD-10-CM

## 2024-05-10 DIAGNOSIS — I2510 Atherosclerotic heart disease of native coronary artery without angina pectoris: Secondary | ICD-10-CM

## 2024-05-10 DIAGNOSIS — Z5982 Transportation insecurity: Secondary | ICD-10-CM | POA: Insufficient documentation

## 2024-05-10 DIAGNOSIS — F17210 Nicotine dependence, cigarettes, uncomplicated: Secondary | ICD-10-CM | POA: Diagnosis not present

## 2024-05-10 DIAGNOSIS — Z7982 Long term (current) use of aspirin: Secondary | ICD-10-CM | POA: Diagnosis not present

## 2024-05-10 DIAGNOSIS — J439 Emphysema, unspecified: Secondary | ICD-10-CM | POA: Diagnosis not present

## 2024-05-10 DIAGNOSIS — J449 Chronic obstructive pulmonary disease, unspecified: Secondary | ICD-10-CM

## 2024-05-10 DIAGNOSIS — Z7985 Long-term (current) use of injectable non-insulin antidiabetic drugs: Secondary | ICD-10-CM | POA: Diagnosis not present

## 2024-05-10 DIAGNOSIS — M1990 Unspecified osteoarthritis, unspecified site: Secondary | ICD-10-CM | POA: Insufficient documentation

## 2024-05-10 DIAGNOSIS — Z8673 Personal history of transient ischemic attack (TIA), and cerebral infarction without residual deficits: Secondary | ICD-10-CM | POA: Diagnosis not present

## 2024-05-10 DIAGNOSIS — C3411 Malignant neoplasm of upper lobe, right bronchus or lung: Secondary | ICD-10-CM | POA: Diagnosis not present

## 2024-05-10 DIAGNOSIS — E119 Type 2 diabetes mellitus without complications: Secondary | ICD-10-CM | POA: Insufficient documentation

## 2024-05-10 DIAGNOSIS — Z48813 Encounter for surgical aftercare following surgery on the respiratory system: Secondary | ICD-10-CM | POA: Diagnosis not present

## 2024-05-10 HISTORY — PX: VIDEO BRONCHOSCOPY WITH ENDOBRONCHIAL NAVIGATION: SHX6175

## 2024-05-10 HISTORY — PX: BRONCHIAL BIOPSY: SHX5109

## 2024-05-10 HISTORY — PX: BRONCHIAL NEEDLE ASPIRATION BIOPSY: SHX5106

## 2024-05-10 HISTORY — PX: BRONCHIAL WASHINGS: SHX5105

## 2024-05-10 HISTORY — PX: BRONCHIAL BRUSHINGS: SHX5108

## 2024-05-10 HISTORY — PX: VIDEO BRONCHOSCOPY WITH ENDOBRONCHIAL ULTRASOUND: SHX6177

## 2024-05-10 HISTORY — DX: Essential (primary) hypertension: I10

## 2024-05-10 HISTORY — DX: Dyspnea, unspecified: R06.00

## 2024-05-10 LAB — GLUCOSE, CAPILLARY
Glucose-Capillary: 146 mg/dL — ABNORMAL HIGH (ref 70–99)
Glucose-Capillary: 153 mg/dL — ABNORMAL HIGH (ref 70–99)

## 2024-05-10 SURGERY — VIDEO BRONCHOSCOPY WITH ENDOBRONCHIAL NAVIGATION
Anesthesia: General | Laterality: Bilateral

## 2024-05-10 MED ORDER — PROPOFOL 500 MG/50ML IV EMUL
INTRAVENOUS | Status: DC | PRN
  Administered 2024-05-10: 150 ug/kg/min via INTRAVENOUS

## 2024-05-10 MED ORDER — INSULIN ASPART 100 UNIT/ML IJ SOLN
0.0000 [IU] | INTRAMUSCULAR | Status: DC | PRN
  Administered 2024-05-10: 2 [IU] via SUBCUTANEOUS
  Filled 2024-05-10: qty 1

## 2024-05-10 MED ORDER — LACTATED RINGERS IV SOLN: INTRAVENOUS | Status: DC

## 2024-05-10 MED ORDER — MIDAZOLAM HCL 2 MG/2ML IJ SOLN
INTRAMUSCULAR | Status: DC | PRN
  Administered 2024-05-10: 2 mg via INTRAVENOUS

## 2024-05-10 MED ORDER — MIDAZOLAM HCL 2 MG/2ML IJ SOLN
INTRAMUSCULAR | Status: AC
  Filled 2024-05-10: qty 2

## 2024-05-10 MED ORDER — CHLORHEXIDINE GLUCONATE 0.12 % MT SOLN
OROMUCOSAL | Status: AC
  Administered 2024-05-10: 15 mL
  Filled 2024-05-10: qty 15

## 2024-05-10 MED ORDER — LABETALOL HCL 5 MG/ML IV SOLN
INTRAVENOUS | Status: DC | PRN
  Administered 2024-05-10 (×2): 5 mg via INTRAVENOUS

## 2024-05-10 MED ORDER — LIDOCAINE 2% (20 MG/ML) 5 ML SYRINGE
INTRAMUSCULAR | Status: DC | PRN
  Administered 2024-05-10: 40 mg via INTRAVENOUS

## 2024-05-10 MED ORDER — ROCURONIUM BROMIDE 10 MG/ML (PF) SYRINGE
PREFILLED_SYRINGE | INTRAVENOUS | Status: DC | PRN
  Administered 2024-05-10: 10 mg via INTRAVENOUS
  Administered 2024-05-10: 50 mg via INTRAVENOUS

## 2024-05-10 MED ORDER — PROPOFOL 10 MG/ML IV BOLUS
INTRAVENOUS | Status: DC | PRN
  Administered 2024-05-10: 30 mg via INTRAVENOUS
  Administered 2024-05-10: 150 mg via INTRAVENOUS
  Administered 2024-05-10: 50 mg via INTRAVENOUS

## 2024-05-10 MED ORDER — FENTANYL CITRATE (PF) 100 MCG/2ML IJ SOLN
INTRAMUSCULAR | Status: AC
  Filled 2024-05-10: qty 2

## 2024-05-10 MED ORDER — FENTANYL CITRATE (PF) 250 MCG/5ML IJ SOLN
INTRAMUSCULAR | Status: DC | PRN
  Administered 2024-05-10 (×2): 50 ug via INTRAVENOUS

## 2024-05-10 MED ORDER — ONDANSETRON HCL 4 MG/2ML IJ SOLN
INTRAMUSCULAR | Status: DC | PRN
  Administered 2024-05-10: 4 mg via INTRAVENOUS

## 2024-05-10 MED ORDER — ACETAMINOPHEN 500 MG PO TABS
1000.0000 mg | ORAL_TABLET | Freq: Once | ORAL | Status: AC
  Administered 2024-05-10: 1000 mg via ORAL
  Filled 2024-05-10: qty 2

## 2024-05-10 MED ORDER — SUGAMMADEX SODIUM 200 MG/2ML IV SOLN
INTRAVENOUS | Status: DC | PRN
  Administered 2024-05-10: 200 mg via INTRAVENOUS

## 2024-05-10 MED ORDER — DEXAMETHASONE SODIUM PHOSPHATE 10 MG/ML IJ SOLN
INTRAMUSCULAR | Status: DC | PRN
  Administered 2024-05-10: 5 mg via INTRAVENOUS

## 2024-05-10 NOTE — Op Note (Signed)
 Video Bronchoscopy with Robotic Assisted Bronchoscopic Navigation   Date of Operation: 05/10/2024   Pre-op Diagnosis: Lung Mass  Surgeon: Belva November, MD  Assistants: Dorn Chill, MD  Anesthesia: General endotracheal anesthesia  Operation: Flexible video fiberoptic bronchoscopy with robotic assistance and biopsies.  Estimated Blood Loss: Minimal  Complications: None  Indications and History: Peggy Warren is a 58 y.o. female with history of lung mass presenting for biopsy.  Recommendation made to achieve a tissue diagnosis via robotic assisted navigational bronchoscopy.  The risks, benefits, complications, treatment options and expected outcomes were discussed with the patient.  The possibilities of pneumothorax, pneumonia, reaction to medication, pulmonary aspiration, perforation of a viscus, bleeding, failure to diagnose a condition and creating a complication requiring transfusion or operation were discussed with the patient who freely signed the consent.    Description of Procedure: The patient was seen in the Preoperative Area, was examined and was deemed appropriate to proceed.  The patient was taken to [facility] Endoscopy room 3, identified as Peggy Warren and the procedure verified as Flexible Video Fiberoptic Bronchoscopy.  A Time Out was held and the above information confirmed.   Prior to the date of the procedure a high-resolution CT scan of the chest was performed. Utilizing ION software program a virtual tracheobronchial tree was generated to allow the creation of distinct navigation pathways to the patient's parenchymal abnormalities. After being taken to the operating room general anesthesia was initiated and the patient  was orally intubated. The video fiberoptic bronchoscope was introduced via the endotracheal tube and a general inspection was performed which showed normal right and left lung anatomy. Aspiration of the bilateral mainstems was completed to remove any  remaining secretions. Robotic catheter inserted into patient's endotracheal tube.   Target #1 RUL mass: The distinct navigation pathways prepared prior to this procedure were then utilized to navigate to patient's lesion identified on CT scan. The robotic catheter was secured into place and the vision probe was withdrawn.  Lesion location was approximated using fluoroscopy.  Local registration and targeting was performed using [employer] Cios mobile C-arm three-dimensional imaging. Radial EBUS was performed to aproximate the position of the lesion and an eccentric view was obtained Under fluoroscopic guidance transbronchial needle brushings, transbronchial needle biopsies, and transbronchial forceps biopsies were performed to be sent for cytology and pathology.  Needle-in-lesion was confirmed using Cios mobile C-arm. A bronchioalveolar lavage was performed in the RUL and sent for cytology.  Target #2 EBUS: We then inserted the EBUS bronchoscope into the airway and evaluated the hilar and mediastinal lymph nodes. Stations 11L, 7, and 11R superior were examined and noted to be enlarged. These were biopsied with a 21G Olympus ViziShot 2 needle. Samples were sent for slide and cytology.  At the end of the procedure a general airway inspection was performed and there was no evidence of active bleeding. The bronchoscope was removed.  The patient tolerated the procedure well. There was no significant blood loss and there were no obvious complications. A post-procedural chest x-ray is pending.  Samples Target #1: RUL mass 1. Transbronchial needle brushings from RUL mass 2. Transbronchial Wang needle biopsies from RUL mass 3. Transbronchial forceps biopsies from RUL mass 4. Bronchoalveolar lavage from RUL mass  Samples Target #2: EBUS/TBNA to stations 11L, 7, and 11R superior.  Plans:  The patient will be discharged from the PACU to home when recovered from anesthesia and after chest x-ray is  reviewed. We will review the cytology, pathology and microbiology  results with the patient when they become available. Outpatient followup will be with myself.  Belva November, MD [facility] 05/10/2024 9:06 AM

## 2024-05-10 NOTE — Transfer of Care (Signed)
 Immediate Anesthesia Transfer of Care Note  Patient: Peggy Warren  Procedure(s) Performed: VIDEO BRONCHOSCOPY WITH ENDOBRONCHIAL NAVIGATION (Bilateral) BRONCHOSCOPY, WITH EBUS (Bilateral) BRONCHOSCOPY, WITH NEEDLE ASPIRATION BIOPSY BRONCHOSCOPY, WITH BRUSH BIOPSY BRONCHOSCOPY, WITH BIOPSY IRRIGATION, BRONCHUS  Patient Location: PACU ENDO  Anesthesia Type:General  Level of Consciousness: awake and alert   Airway & Oxygen Therapy: Patient Spontanous Breathing and Patient connected to face mask oxygen  Post-op Assessment: Report given to RN and Post -op Vital signs reviewed and stable  Post vital signs: Reviewed and stable  Last Vitals:  Vitals Value Taken Time  BP 139/72 05/10/24 09:14  Temp 36.4 C 05/10/24 09:13  Pulse 55 05/10/24 09:16  Resp 19 05/10/24 09:16  SpO2 99 % 05/10/24 09:16  Vitals shown include unfiled device data.  Last Pain:  Vitals:   05/10/24 0913  TempSrc: Temporal  PainSc:       Patients Stated Pain Goal: 0 (05/10/24 9366)  Complications: No notable events documented.

## 2024-05-10 NOTE — Anesthesia Procedure Notes (Signed)
 Procedure Name: Intubation Date/Time: 05/10/2024 7:41 AM  Performed by: Boyce Shilling, CRNAPre-anesthesia Checklist: Patient identified, Emergency Drugs available, Suction available, Timeout performed and Patient being monitored Patient Re-evaluated:Patient Re-evaluated prior to induction Oxygen Delivery Method: Circle system utilized Preoxygenation: Pre-oxygenation with 100% oxygen Induction Type: IV induction Ventilation: Mask ventilation without difficulty Laryngoscope Size: Mac and 3 Grade View: Grade I Tube type: Oral Tube size: 8.5 mm Number of attempts: 1 Airway Equipment and Method: Stylet Placement Confirmation: ETT inserted through vocal cords under direct vision, positive ETCO2, CO2 detector and breath sounds checked- equal and bilateral Secured at: 20 cm Tube secured with: Tape Dental Injury: Teeth and Oropharynx as per pre-operative assessment

## 2024-05-10 NOTE — Interval H&P Note (Signed)
 S: Feels well, cough unchanged, no chest pain  O: Vitals:   05/10/24 0604  BP: (!) 152/92  Pulse: (!) 56  Resp: 20  Temp: 98.8 F (37.1 C)  SpO2: 94%   General: Well-appearing and in no distress. Well-nourished HEENT: Mucous membranes moist, no evidence of postnasal drip Respiratory: Trachea is midline, no respiratory distress, good bilateral air entry, no wheezes, rales, or rhonchi Cardiovascular: Heart with regular rate and rhythm, normal S1 and S2, no murmurs, rubs, or gallops Neuro: Alert and oriented, no gross focal deficits  A/P: 58 year old female with a RUL mass who presents for robotic assisted navigational bronchoscopy with EBUS/TBNA for tissue acquisition and staging of the mediastinum. Risks and benefits explained and patient consents to proceed. She is appropriate for the procedure.  Belva November, MD [facility] 05/10/2024 7:26 AM

## 2024-05-10 NOTE — Procedures (Signed)
 Procedure Note  Patient: Peggy Warren  [employer] Cios mobile C-arm was utilized to identify and biopsy of RUL mass.  Needle-in-lesion was confirmed using real-time Cios imaging, and images were uploaded to PACS.     Belva November, MD [facility] 05/10/2024 9:07 AM

## 2024-05-11 ENCOUNTER — Ambulatory Visit (HOSPITAL_COMMUNITY)
Admission: RE | Admit: 2024-05-11 | Discharge: 2024-05-11 | Disposition: A | Discharge status: Home / Self Care | Source: Ambulatory Visit | Attending: Student in an Organized Health Care Education/Training Program | Admitting: Student in an Organized Health Care Education/Training Program

## 2024-05-11 ENCOUNTER — Encounter: Payer: Self-pay | Admitting: Internal Medicine

## 2024-05-11 DIAGNOSIS — R918 Other nonspecific abnormal finding of lung field: Secondary | ICD-10-CM | POA: Diagnosis not present

## 2024-05-11 LAB — GLUCOSE, CAPILLARY
Glucose-Capillary: 111 mg/dL — ABNORMAL HIGH (ref 70–99)
Glucose-Capillary: 110 mg/dL — ABNORMAL HIGH (ref 70–99)

## 2024-05-11 LAB — CYTOLOGY - NON PAP

## 2024-05-11 MED ORDER — FLUDEOXYGLUCOSE F - 18 (FDG) INJECTION
9.4100 | Freq: Once | INTRAVENOUS | Status: AC
  Administered 2024-05-11: 9.41 via INTRAVENOUS

## 2024-05-13 ENCOUNTER — Encounter (HOSPITAL_COMMUNITY): Payer: Self-pay | Admitting: Student in an Organized Health Care Education/Training Program

## 2024-05-14 ENCOUNTER — Ambulatory Visit: Payer: Self-pay | Admitting: Student in an Organized Health Care Education/Training Program

## 2024-05-14 DIAGNOSIS — C3491 Malignant neoplasm of unspecified part of right bronchus or lung: Secondary | ICD-10-CM

## 2024-05-15 NOTE — Anesthesia Postprocedure Evaluation (Signed)
 Anesthesia Post Note  Patient: Peggy Warren  Procedure(s) Performed: VIDEO BRONCHOSCOPY WITH ENDOBRONCHIAL NAVIGATION (Bilateral) BRONCHOSCOPY, WITH EBUS (Bilateral) BRONCHOSCOPY, WITH NEEDLE ASPIRATION BIOPSY BRONCHOSCOPY, WITH BRUSH BIOPSY BRONCHOSCOPY, WITH BIOPSY IRRIGATION, BRONCHUS     Patient location during evaluation: PACU Anesthesia Type: General Level of consciousness: awake and alert Pain management: pain level controlled Vital Signs Assessment: post-procedure vital signs reviewed and stable Respiratory status: spontaneous breathing, nonlabored ventilation, respiratory function stable and patient connected to nasal cannula oxygen Cardiovascular status: blood pressure returned to baseline and stable Postop Assessment: no apparent nausea or vomiting Anesthetic complications: no   No notable events documented.  Last Vitals:  Vitals:   05/10/24 0953 05/10/24 1000  BP:  (!) 150/62  Pulse: (!) 58 (!) 58  Resp: 20 (!) 21  Temp:    SpO2: 92% 90%    Last Pain:  Vitals:   05/10/24 1000  TempSrc:   PainSc: 3                  Epifanio Lamar BRAVO

## 2024-05-16 ENCOUNTER — Telehealth: Payer: Self-pay | Admitting: Internal Medicine

## 2024-05-16 ENCOUNTER — Other Ambulatory Visit: Payer: Self-pay | Admitting: Internal Medicine

## 2024-05-16 ENCOUNTER — Ambulatory Visit: Admitting: Student in an Organized Health Care Education/Training Program

## 2024-05-16 NOTE — Telephone Encounter (Unsigned)
 Copied from CRM [id number]. Topic: Clinical - Medication Refill >> May 16, 2024 12:28 PM Berneda FALCON wrote: Medication: tirzepatide  (MOUNJARO ) 2.5 MG/0.5ML Pen   Has the patient contacted their pharmacy? Yes (Agent: If no, request that the patient contact the pharmacy for the refill. If patient does not wish to contact the pharmacy document the reason why and proceed with request.) (Agent: If yes, when and what did the pharmacy advise?)  This is the patient's preferred pharmacy:  [facility] - [place], [place] - [address] [place] [address] Phone: [phone] Fax: [phone]  Is this the correct pharmacy for this prescription? Yes If no, delete pharmacy and type the correct one.   Has the prescription been filled recently? No  Is the patient out of the medication? Yes, has never taken.  Has the patient been seen for an appointment in the last year OR does the patient have an upcoming appointment? Yes  Can we respond through MyChart? Yes  Agent: Please be advised that Rx refills may take up to 3 business days. We ask that you follow-up with your pharmacy.

## 2024-05-16 NOTE — Telephone Encounter (Unsigned)
 Copied from CRM #[id number]. Topic: General - Other >> May 16, 2024 12:10 PM Frederich PARAS wrote: Reason for CRM: PT called to get a reprint of prescription due to her misplacing them ,the prescription is for a cane and a shower chair she is wanting to have that re printed if possible pt call back # [phone]

## 2024-05-17 ENCOUNTER — Other Ambulatory Visit: Payer: Self-pay

## 2024-05-17 MED ORDER — TIRZEPATIDE 2.5 MG/0.5ML ~~LOC~~ SOAJ: 2.5000 mg | SUBCUTANEOUS | 11 refills | Status: DC

## 2024-05-18 NOTE — Telephone Encounter (Signed)
 Called and left detailed voice message informing patient I wasn't able to reprint the script, however I was able to get the orders printed and will place up front for patient to pick up.

## 2024-05-19 NOTE — Progress Notes (Signed)
 The proposed treatment discussed in conference is for discussion purpose only and is not a binding recommendation.  The patients have not been physically examined, or presented with their treatment options.  Therefore, final treatment plans cannot be decided.

## 2024-05-22 ENCOUNTER — Encounter: Payer: Self-pay | Admitting: Internal Medicine

## 2024-05-22 ENCOUNTER — Ambulatory Visit: Payer: Self-pay

## 2024-05-22 ENCOUNTER — Other Ambulatory Visit: Payer: Self-pay | Admitting: Internal Medicine

## 2024-05-22 ENCOUNTER — Ambulatory Visit: Admitting: Orthopaedic Surgery

## 2024-05-22 ENCOUNTER — Ambulatory Visit (INDEPENDENT_AMBULATORY_CARE_PROVIDER_SITE_OTHER): Admitting: Internal Medicine

## 2024-05-22 VITALS — BP 138/72 | HR 73 | Temp 98.5°F | Ht 62.0 in | Wt 191.6 lb

## 2024-05-22 DIAGNOSIS — E559 Vitamin D deficiency, unspecified: Secondary | ICD-10-CM | POA: Diagnosis not present

## 2024-05-22 DIAGNOSIS — R269 Unspecified abnormalities of gait and mobility: Secondary | ICD-10-CM | POA: Insufficient documentation

## 2024-05-22 DIAGNOSIS — F17200 Nicotine dependence, unspecified, uncomplicated: Secondary | ICD-10-CM | POA: Diagnosis not present

## 2024-05-22 DIAGNOSIS — E119 Type 2 diabetes mellitus without complications: Secondary | ICD-10-CM

## 2024-05-22 DIAGNOSIS — Z794 Long term (current) use of insulin: Secondary | ICD-10-CM | POA: Diagnosis not present

## 2024-05-22 DIAGNOSIS — E538 Deficiency of other specified B group vitamins: Secondary | ICD-10-CM

## 2024-05-22 DIAGNOSIS — R918 Other nonspecific abnormal finding of lung field: Secondary | ICD-10-CM

## 2024-05-22 LAB — POCT GLYCOSYLATED HEMOGLOBIN (HGB A1C): Hemoglobin A1C: 6.1 % — AB (ref 4.0–5.6)

## 2024-05-22 MED ORDER — DEXCOM G7 SENSOR MISC: 3 refills | Status: DC

## 2024-05-22 MED ORDER — OZEMPIC (0.25 OR 0.5 MG/DOSE) 2 MG/3ML ~~LOC~~ SOPN
PEN_INJECTOR | SUBCUTANEOUS | 3 refills | Status: DC
Start: 2024-05-22 — End: 2024-08-14

## 2024-05-22 MED ORDER — DEXCOM G7 RECEIVER DEVI: 0 refills | Status: DC

## 2024-05-22 NOTE — Telephone Encounter (Signed)
 Copied from CRM [id number]. Topic: General - Other >> May 22, 2024  9:34 AM Viola F wrote: Reason for CRM: Patient daughter [name] requesting a call back regarding paperwork that was supposed to be completed. Please call her with a update at [phone]

## 2024-05-22 NOTE — Assessment & Plan Note (Signed)
 Lab Results  Component Value Date   VITAMINB12 178 (L) 08/25/2023   Low, to start oral replacement - b12 1000 mcg qd

## 2024-05-22 NOTE — Telephone Encounter (Unsigned)
 Copied from CRM [id number]. Topic: Clinical - Prescription Issue >> May 22, 2024  9:58 AM Franky GRADE wrote: Reason for CRM: Patient's daughter was informed by the pharmacy that the insurance denied the tirzepatide  (MOUNJARO ) 2.5 MG/0.5ML Pen [[id number]]. She would like to know what are the next steps.

## 2024-05-22 NOTE — Telephone Encounter (Signed)
 Called and spoke with patient's daughter. Informed her that at this time form is currenlty in Dr.John's office. Patient does have an appointment at 4:00 today with us  regarding blood sugar issues. Will try to have completed by then for fax and pick up

## 2024-05-22 NOTE — Patient Instructions (Addendum)
 Ok to start the chantix  as you mentioned  Ok to Not take the Mounjaro , but Please take all new medication as prescribed - the ozempic  0.25 mg once weekly, and remember to let us  know in 1 month if you are taking this ok and to increase the dose  Ok to start the Dexcom G7 sensor to monitor the sugars  Please continue all other medications as before, including all the medications restarted at your last visit  Please have the pharmacy call with any other refills you may need.  Please keep your appointments with your specialists as you may have planned  Your A1c was checked today  Please make an Appointment to return in 6 months, or sooner if needed
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
## 2024-05-22 NOTE — Assessment & Plan Note (Signed)
 Lab Results  Component Value Date   HGBA1C 6.1 (A) 05/22/2024   With uncontrolled obesity, pt was not able to start the mounjaro  due to some breakdown in the PA process; she is willing to try ozempic  0.25 mg weekly

## 2024-05-22 NOTE — Telephone Encounter (Signed)
 FYI Only or Action Required?: FYI only for provider.  Patient was last seen in primary care on 05/09/2024 by [name], MD.  Called Nurse Triage reporting Blood Sugar Problem.  Symptoms began today.  Interventions attempted: Nothing.  Symptoms are: unchanged.  Triage Disposition: Home Care  Patient/caregiver understands and will follow disposition?: Yes     Copied from CRM [id number]. Topic: Clinical - Red Word Triage >> May 22, 2024  9:55 AM Franky GRADE wrote: Red Word that prompted transfer to Nurse Triage: Patient's daughter is calling because the patient's sugar level is at 251, she is shaking and feels weak. Reason for Disposition  [1] Blood glucose 240 - 300 mg/dL (13.3 - 16.7 mmol/L) AND [2] does not use insulin  (e.g., not insulin -dependent; most people with type 2 diabetes)  Answer Assessment - Initial Assessment Questions 1. BLOOD GLUCOSE: What is your blood glucose level?      251 and then 240  2. ONSET: When did you check the blood glucose?     About 15-20 minutes 3. USUAL RANGE: What is your glucose level usually? (e.g., usual fasting morning value, usual evening value)     Hasn't been checking  4. KETONES: Do you check for ketones (urine or blood test strips)? If Yes, ask: What does the test show now?      no 5. TYPE 1 or 2:  Do you know what type of diabetes you have?  (e.g., Type 1, Type 2, Gestational; doesn't know)      Type 2 6. INSULIN : Do you take insulin ? What type of insulin (s) do you use? What is the mode of delivery? (syringe, pen; injection or pump)?      Not on insulin   7. DIABETES PILLS: Do you take any pills for your diabetes? If Yes, ask: Have you missed taking any pills recently?     Not on anything 8. OTHER SYMPTOMS: Do you have any symptoms? (e.g., fever, frequent urination, difficulty breathing, dizziness, weakness, vomiting)     Nausea, shaking, weak   Vitals  BP 159/84 HR 78  denies feeling faint, sob, or chest  pain  Protocols used: Diabetes - High Blood Sugar-A-AH

## 2024-05-22 NOTE — Assessment & Plan Note (Signed)
 Pt counsled to quit smoking, pt states will start chantix 

## 2024-05-22 NOTE — Assessment & Plan Note (Signed)
 Last vitamin D Lab Results  Component Value Date   VD25OH 30.12 08/25/2023   Low, to start oral replacement

## 2024-05-23 ENCOUNTER — Inpatient Hospital Stay

## 2024-05-23 ENCOUNTER — Inpatient Hospital Stay: Attending: Internal Medicine | Admitting: Internal Medicine

## 2024-05-23 ENCOUNTER — Other Ambulatory Visit: Payer: Self-pay

## 2024-05-23 VITALS — BP 121/66 | HR 72 | Temp 98.4°F | Resp 17 | Ht 62.0 in | Wt 191.0 lb

## 2024-05-23 DIAGNOSIS — Z87891 Personal history of nicotine dependence: Secondary | ICD-10-CM | POA: Insufficient documentation

## 2024-05-23 DIAGNOSIS — C3411 Malignant neoplasm of upper lobe, right bronchus or lung: Secondary | ICD-10-CM | POA: Insufficient documentation

## 2024-05-23 DIAGNOSIS — R918 Other nonspecific abnormal finding of lung field: Secondary | ICD-10-CM

## 2024-05-23 DIAGNOSIS — C3490 Malignant neoplasm of unspecified part of unspecified bronchus or lung: Secondary | ICD-10-CM

## 2024-05-23 LAB — CBC WITH DIFFERENTIAL (CANCER CENTER ONLY)
Abs Immature Granulocytes: 0.02 K/uL (ref 0.00–0.07)
Basophils Absolute: 0.1 K/uL (ref 0.0–0.1)
Basophils Relative: 1 %
Eosinophils Absolute: 0.1 K/uL (ref 0.0–0.5)
Eosinophils Relative: 2 %
HCT: 40.8 % (ref 36.0–46.0)
Hemoglobin: 14.1 g/dL (ref 12.0–15.0)
Immature Granulocytes: 0 %
Lymphocytes Relative: 28 %
Lymphs Abs: 2 K/uL (ref 0.7–4.0)
MCH: 33 pg (ref 26.0–34.0)
MCHC: 34.6 g/dL (ref 30.0–36.0)
MCV: 95.6 fL (ref 80.0–100.0)
Monocytes Absolute: 0.4 K/uL (ref 0.1–1.0)
Monocytes Relative: 6 %
Neutro Abs: 4.5 K/uL (ref 1.7–7.7)
Neutrophils Relative %: 63 %
Platelet Count: 279 K/uL (ref 150–400)
RBC: 4.27 MIL/uL (ref 3.87–5.11)
RDW: 12.1 % (ref 11.5–15.5)
WBC Count: 7.1 K/uL (ref 4.0–10.5)
nRBC: 0 % (ref 0.0–0.2)

## 2024-05-23 LAB — CMP (CANCER CENTER ONLY)
ALT: 28 U/L (ref 0–44)
AST: 62 U/L — ABNORMAL HIGH (ref 15–41)
Albumin: 4.2 g/dL (ref 3.5–5.0)
Alkaline Phosphatase: 70 U/L (ref 38–126)
Anion gap: 6 (ref 5–15)
BUN: 6 mg/dL (ref 6–20)
CO2: 28 mmol/L (ref 22–32)
Calcium: 9 mg/dL (ref 8.9–10.3)
Chloride: 103 mmol/L (ref 98–111)
Creatinine: 0.6 mg/dL (ref 0.44–1.00)
GFR, Estimated: >60 mL/min (ref >60)
Glucose, Bld: 146 mg/dL — ABNORMAL HIGH (ref 70–99)
Potassium: 3.8 mmol/L (ref 3.5–5.1)
Sodium: 137 mmol/L (ref 135–145)
Total Bilirubin: 0.9 mg/dL (ref 0.0–1.2)
Total Protein: 7.5 g/dL (ref 6.5–8.1)

## 2024-05-23 NOTE — Progress Notes (Signed)
 NN met with pt today at her consult with Dr Sherrod. Pt was unaccompanied, however she did conference her dtr, [name], in during the consult via cell phone. Pt has stage IIB squamous cell carcinoma.  The plan for the pt is an evaluation by CT surgery to discuss if she is a surgical candidate, and if she should be given neoadjuvant or adjuvant treatment. If patient is NAC for surgery, she will be referred to radiation oncology for concurrent chemoradiation. No obvious barriers to her care at this time. Pt does not drive, but her dtr provides transportation as needed, and she also uses the transportation services offered by Medicaid. NN provided pt with direct contact information and encouraged to reach out with any questions or concerns. NN escorted pt to scheduling to arrange a f/u appt with Dr Sherrod or Charlott to discuss next steps after her consult with CT surgery on 8/22.
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
## 2024-05-24 ENCOUNTER — Inpatient Hospital Stay: Admitting: Licensed Clinical Social Worker

## 2024-05-24 DIAGNOSIS — C3411 Malignant neoplasm of upper lobe, right bronchus or lung: Secondary | ICD-10-CM

## 2024-05-24 NOTE — Progress Notes (Signed)
 [facility] Clinical Social Work  Initial Assessment   Peggy Warren is a 58 y.o. year old female contacted caregiver by phone. Clinical Social Work was referred by medical provider for assessment of psychosocial needs.   SDOH (Social Determinants of Health) assessments performed: Yes   SDOH Screenings   Food Insecurity: Food Insecurity Present (05/23/2024)  Housing: Low Risk  (05/23/2024)  Transportation Needs: No Transportation Needs (05/23/2024)  Utilities: Not At Risk (05/23/2024)  Depression (PHQ2-9): Low Risk  (05/23/2024)  Tobacco Use: High Risk (05/22/2024)    PHQ 2/9:    05/23/2024    5:35 PM 08/25/2023   10:01 AM 10/21/2020    9:31 AM  Depression screen PHQ 2/9  Decreased Interest 0 0 0  Down, Depressed, Hopeless 0 0 0  PHQ - 2 Score 0 0 0  Altered sleeping   0  Tired, decreased energy   0  Change in appetite   0  Feeling bad or failure about yourself    0  Trouble concentrating   0  Moving slowly or fidgety/restless   0  Suicidal thoughts   0  PHQ-9 Score   0     Distress Screen completed: No     No data to display            Family/Social Information:  Housing Arrangement: patient lives with a room mate, 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 Family members/support persons in your life? Pt's daughter resides locally and can provide some assistance as needed. Transportation concerns: no  Employment: Unemployed .  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 Pt is still undergoing diagnostics, treatment is not yet finalized Concerns about diagnosis and/or treatment: Overwhelmed by information, How will I care  for myself, and Quality of life Patient reported stressors: Finances, Anxiety/ nervousness, and Adjusting to my illness Hopes and/or priorities: pt's priority is to start treatment w/ the hope of positive results Patient enjoys time with family/ friends Current coping skills/ strengths: Capable of independent living , Motivation for treatment/growth , Physical Health , and Supportive family/friends     SUMMARY: Current SDOH Barriers:  Financial constraints related to limited income  Clinical Social Work Clinical Goal(s):  Explore community resource options for unmet needs related to:  Financial Strain   Interventions: Discussed common feeling and emotions when being diagnosed with cancer, and the importance of support during treatment Informed patient of the support team roles and support services at [facility] Provided CSW contact information and encouraged patient to call with any questions or concerns Provided pt w/ information regarding the [employer].  Pt does receive food stamps meeting presumptive eligibility.  Pt instructed to contact pt once treatment starts to initiate the application process.  Pt scheduled to complete advanced directives.    Follow Up Plan: Patient will contact CSW with any support or resource needs Patient verbalizes understanding of plan: Yes    Peggy JONELLE Manna, LCSW Clinical Social Worker [facility]  Patient is participating in a Managed Medicaid Plan:  Yes

## 2024-05-25 ENCOUNTER — Other Ambulatory Visit (HOSPITAL_COMMUNITY): Payer: Self-pay

## 2024-05-25 ENCOUNTER — Telehealth: Payer: Self-pay | Admitting: Physician Assistant

## 2024-05-25 NOTE — Telephone Encounter (Signed)
 Rescheduled appointments per provider request. Left the patient a voicemail with new times of appointment.

## 2024-05-28 ENCOUNTER — Other Ambulatory Visit (HOSPITAL_COMMUNITY)

## 2024-05-29 LAB — MOLECULAR PATHOLOGY
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
## 2024-05-31 ENCOUNTER — Ambulatory Visit (HOSPITAL_COMMUNITY)
Admission: RE | Admit: 2024-05-31 | Discharge: 2024-05-31 | Disposition: A | Discharge status: Home / Self Care | Source: Ambulatory Visit | Attending: Internal Medicine | Admitting: Internal Medicine

## 2024-05-31 DIAGNOSIS — C3490 Malignant neoplasm of unspecified part of unspecified bronchus or lung: Secondary | ICD-10-CM | POA: Insufficient documentation

## 2024-05-31 MED ORDER — GADOBUTROL 1 MMOL/ML IV SOLN
8.0000 mL | Freq: Once | INTRAVENOUS | Status: AC | PRN
  Administered 2024-05-31: 8 mL via INTRAVENOUS

## 2024-06-01 ENCOUNTER — Other Ambulatory Visit: Payer: Self-pay | Admitting: Thoracic Surgery (Cardiothoracic Vascular Surgery)

## 2024-06-01 ENCOUNTER — Telehealth: Payer: Self-pay

## 2024-06-01 ENCOUNTER — Other Ambulatory Visit (HOSPITAL_COMMUNITY): Payer: Self-pay

## 2024-06-01 ENCOUNTER — Encounter: Payer: Self-pay | Admitting: *Deleted

## 2024-06-01 ENCOUNTER — Other Ambulatory Visit: Payer: Self-pay | Admitting: *Deleted

## 2024-06-01 ENCOUNTER — Ambulatory Visit
Attending: Thoracic Surgery (Cardiothoracic Vascular Surgery) | Admitting: Thoracic Surgery (Cardiothoracic Vascular Surgery)

## 2024-06-01 VITALS — BP 130/76 | HR 78 | Resp 18 | Ht 62.0 in

## 2024-06-01 DIAGNOSIS — R918 Other nonspecific abnormal finding of lung field: Secondary | ICD-10-CM | POA: Insufficient documentation

## 2024-06-01 DIAGNOSIS — C3411 Malignant neoplasm of upper lobe, right bronchus or lung: Secondary | ICD-10-CM

## 2024-06-01 NOTE — Telephone Encounter (Signed)
 Pharmacy Patient Advocate Encounter   Received notification from CoverMyMeds that prior authorization for Dexcom G7 Sensor is required/requested.   Insurance verification completed.   The patient is insured through [employer] .   Per test claim: PA required; PA submitted to above mentioned insurance via Latent Key/confirmation #/EOC [id number] Status is pending

## 2024-06-04 ENCOUNTER — Other Ambulatory Visit (HOSPITAL_COMMUNITY): Payer: Self-pay

## 2024-06-05 ENCOUNTER — Ambulatory Visit: Admitting: Orthopaedic Surgery

## 2024-06-05 ENCOUNTER — Other Ambulatory Visit (HOSPITAL_COMMUNITY): Payer: Self-pay

## 2024-06-05 NOTE — Telephone Encounter (Signed)
 Pharmacy Patient Advocate Encounter  Received notification from [facility] MEDICATION that Prior Authorization for Dexcom G7 Sensor has been DENIED.  No reason given; No denial letter received via Fax or CMM. It has been requested and will be uploaded to the media tab once received.   PA #/Case ID/Reference #: [id number]  Placed a call to the insurance because the patient has started Ozempic  and the Receiver was approved.   Case ID: [id number]
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
## 2024-06-06 ENCOUNTER — Other Ambulatory Visit (HOSPITAL_COMMUNITY): Payer: Self-pay

## 2024-06-07 ENCOUNTER — Other Ambulatory Visit: Payer: Self-pay | Admitting: Internal Medicine

## 2024-06-07 ENCOUNTER — Ambulatory Visit: Admitting: Physician Assistant

## 2024-06-07 ENCOUNTER — Inpatient Hospital Stay

## 2024-06-07 ENCOUNTER — Other Ambulatory Visit

## 2024-06-07 ENCOUNTER — Inpatient Hospital Stay (HOSPITAL_BASED_OUTPATIENT_CLINIC_OR_DEPARTMENT_OTHER): Admitting: Physician Assistant

## 2024-06-07 VITALS — BP 139/66 | HR 66 | Temp 97.7°F | Resp 17 | Ht 62.0 in | Wt 190.0 lb

## 2024-06-07 DIAGNOSIS — C3411 Malignant neoplasm of upper lobe, right bronchus or lung: Secondary | ICD-10-CM

## 2024-06-07 DIAGNOSIS — Z87891 Personal history of nicotine dependence: Secondary | ICD-10-CM | POA: Diagnosis not present

## 2024-06-07 LAB — CBC WITH DIFFERENTIAL (CANCER CENTER ONLY)
Abs Immature Granulocytes: 0.02 K/uL (ref 0.00–0.07)
Basophils Absolute: 0.1 K/uL (ref 0.0–0.1)
Basophils Relative: 1 %
Eosinophils Absolute: 0.1 K/uL (ref 0.0–0.5)
Eosinophils Relative: 2 %
HCT: 40.8 % (ref 36.0–46.0)
Hemoglobin: 14 g/dL (ref 12.0–15.0)
Immature Granulocytes: 0 %
Lymphocytes Relative: 30 %
Lymphs Abs: 2.3 K/uL (ref 0.7–4.0)
MCH: 32.9 pg (ref 26.0–34.0)
MCHC: 34.3 g/dL (ref 30.0–36.0)
MCV: 96 fL (ref 80.0–100.0)
Monocytes Absolute: 0.6 K/uL (ref 0.1–1.0)
Monocytes Relative: 7 %
Neutro Abs: 4.6 K/uL (ref 1.7–7.7)
Neutrophils Relative %: 60 %
Platelet Count: 343 K/uL (ref 150–400)
RBC: 4.25 MIL/uL (ref 3.87–5.11)
RDW: 11.6 % (ref 11.5–15.5)
WBC Count: 7.7 K/uL (ref 4.0–10.5)
nRBC: 0 % (ref 0.0–0.2)

## 2024-06-07 LAB — CMP (CANCER CENTER ONLY)
ALT: 20 U/L (ref 0–44)
AST: 25 U/L (ref 15–41)
Albumin: 4.1 g/dL (ref 3.5–5.0)
Alkaline Phosphatase: 67 U/L (ref 38–126)
Anion gap: 8 (ref 5–15)
BUN: 7 mg/dL (ref 6–20)
CO2: 27 mmol/L (ref 22–32)
Calcium: 9.3 mg/dL (ref 8.9–10.3)
Chloride: 104 mmol/L (ref 98–111)
Creatinine: 0.73 mg/dL (ref 0.44–1.00)
GFR, Estimated: >60 mL/min (ref >60)
Glucose, Bld: 148 mg/dL — ABNORMAL HIGH (ref 70–99)
Potassium: 4 mmol/L (ref 3.5–5.1)
Sodium: 139 mmol/L (ref 135–145)
Total Bilirubin: 0.9 mg/dL (ref 0.0–1.2)
Total Protein: 7.6 g/dL (ref 6.5–8.1)

## 2024-06-07 MED ORDER — HYDROCODONE-ACETAMINOPHEN 5-325 MG PO TABS: 1.0000 | ORAL_TABLET | ORAL | 0 refills | Status: DC | PRN

## 2024-06-07 MED ORDER — METHOCARBAMOL 500 MG PO TABS: 500.0000 mg | ORAL_TABLET | Freq: Three times a day (TID) | ORAL | 0 refills | Status: DC | PRN

## 2024-06-07 NOTE — Telephone Encounter (Signed)
 Copied from CRM #[id number]. Topic: Clinical - Medication Refill >> Jun 07, 2024 12:07 PM Thersia C wrote: Medication: HYDROcodone -acetaminophen  (NORCO/VICODIN) 5-325 MG tablet  methocarbamol  (ROBAXIN ) 500 MG tablet    Has the patient contacted their pharmacy? Yes (Agent: If no, request that the patient contact the pharmacy for the refill. If patient does not wish to contact the pharmacy document the reason why and proceed with request.) (Agent: If yes, when and what did the pharmacy advise?)  This is the patient's preferred pharmacy:  [facility] - [place], [place] - [address] [place] [address] Phone: [phone] Fax: [phone]    Is this the correct pharmacy for this prescription? Yes If no, delete pharmacy and type the correct one.   Has the prescription been filled recently? No  Is the patient out of the medication? Yes  Has the patient been seen for an appointment in the last year OR does the patient have an upcoming appointment? Yes  Can we respond through MyChart? Yes  Agent: Please be advised that Rx refills may take up to 3 business days. We ask that you follow-up with your pharmacy.

## 2024-06-08 ENCOUNTER — Encounter (HOSPITAL_COMMUNITY): Payer: Self-pay | Admitting: *Deleted

## 2024-06-08 ENCOUNTER — Telehealth: Payer: Self-pay | Admitting: Medical Oncology

## 2024-06-08 ENCOUNTER — Ambulatory Visit (HOSPITAL_COMMUNITY)
Admission: EM | Admit: 2024-06-08 | Discharge: 2024-06-08 | Disposition: A | Discharge status: Home / Self Care | Attending: Family Medicine | Admitting: Family Medicine

## 2024-06-08 ENCOUNTER — Other Ambulatory Visit (HOSPITAL_COMMUNITY): Payer: Self-pay

## 2024-06-08 ENCOUNTER — Other Ambulatory Visit: Payer: Self-pay

## 2024-06-08 DIAGNOSIS — K047 Periapical abscess without sinus: Secondary | ICD-10-CM | POA: Diagnosis not present

## 2024-06-08 HISTORY — DX: Malignant neoplasm of unspecified part of unspecified bronchus or lung: C34.90

## 2024-06-08 MED ORDER — KETOROLAC TROMETHAMINE 30 MG/ML IJ SOLN
INTRAMUSCULAR | Status: AC
  Filled 2024-06-08: qty 1

## 2024-06-08 MED ORDER — KETOROLAC TROMETHAMINE 30 MG/ML IJ SOLN
30.0000 mg | Freq: Once | INTRAMUSCULAR | Status: AC
  Administered 2024-06-08: 30 mg via INTRAMUSCULAR

## 2024-06-08 MED ORDER — AMOXICILLIN-POT CLAVULANATE 875-125 MG PO TABS: 1.0000 | ORAL_TABLET | Freq: Two times a day (BID) | ORAL | 0 refills | Status: AC

## 2024-06-08 MED ORDER — KETOROLAC TROMETHAMINE 10 MG PO TABS: 10.0000 mg | ORAL_TABLET | Freq: Four times a day (QID) | ORAL | 0 refills | Status: AC | PRN

## 2024-06-08 NOTE — ED Triage Notes (Signed)
 C/O left upper and lower dental pain onset 2 days ago without any known fevers. Has been taking Tyl for pain.  States is scheduled for surgery 06/18/24 for lung Ca.

## 2024-06-08 NOTE — Telephone Encounter (Signed)
 Placed a call to check on the authorization for the sensors. Per the representative, it is denied due to the patient not being on insulin . The receiver as approved my mistake but is actually denied.   Denial letter indexed to media tab.

## 2024-06-08 NOTE — Telephone Encounter (Addendum)
 Dental infection -seen at urgent care today . Starting on amoxicillin  today and ketoralac . She wanted to make sure it was ok for her to take these meds . I told her it was ok.   Surgery expected 1-2 weeks.
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
## 2024-06-08 NOTE — Discharge Instructions (Signed)
 You have been given a shot of Toradol  30 mg today.  Take amoxicillin -clavulanate 875 mg--1 tab twice daily with food for 7 days  Ketorolac  10 mg tablets--take 1 tablet every 6 hours as needed for pain.  This is the same medicine that is in the shot we just gave you  Tylenol /acetaminophen  500 mg over-the-counter--maximum dose is 2 tablets every 6 hours as needed for pain or fever please give your surgeons office a call today to let them know what prescriptions you are having to take for this infection and pain.

## 2024-06-12 ENCOUNTER — Telehealth: Payer: Self-pay

## 2024-06-12 NOTE — Telephone Encounter (Signed)
 Copied from CRM [id number]. Topic: General - Other >> Jun 12, 2024  2:32 PM Chiquita SQUIBB wrote: Reason for CRM: Patients daughter is calling in stating that the office should have received forms that the home health is requesting to be filled out for more information regarding the patient on 08/29. They are stating they need this before continuing the services please advise the patients daughter at [phone].

## 2024-06-13 ENCOUNTER — Encounter (HOSPITAL_COMMUNITY): Payer: Self-pay | Admitting: *Deleted

## 2024-06-13 ENCOUNTER — Ambulatory Visit: Admitting: Orthopaedic Surgery

## 2024-06-13 ENCOUNTER — Encounter (HOSPITAL_COMMUNITY): Payer: Self-pay | Admitting: Thoracic Surgery (Cardiothoracic Vascular Surgery)

## 2024-06-13 ENCOUNTER — Other Ambulatory Visit: Payer: Self-pay | Admitting: Thoracic Surgery (Cardiothoracic Vascular Surgery)

## 2024-06-13 DIAGNOSIS — C3411 Malignant neoplasm of upper lobe, right bronchus or lung: Secondary | ICD-10-CM

## 2024-06-13 NOTE — Progress Notes (Signed)
 Surgical Instructions   Your procedure is scheduled on Monday June 18, 2024. Report to [facility] Main Entrance A at 9:00 A.M., then check in with the Admitting office. Any questions or running late day of surgery: call [phone]  Questions prior to your surgery date: call [phone], Monday-Friday, 8am-4pm. If you experience any cold or flu symptoms such as cough, fever, chills, shortness of breath, etc. between now and your scheduled surgery, please notify us  at the above number.     Remember:  Do not eat or drink after midnight the night before your surgery   Take these medicines the morning of surgery with A SIP OF WATER  amLODipine  (NORVASC )  amoxicillin -clavulanate (AUGMENTIN )  atorvastatin  (LIPITOR)  umeclidinium-vilanterol (ANORO ELLIPTA )  varenicline  (CHANTIX )   May take these medicines IF NEEDED: acetaminophen  (TYLENOL )  albuterol  (VENTOLIN  HFA) 108 (90 Base) MCG/ACT inhaler Please bring with you to the hospital HYDROcodone -acetaminophen  (NORCO/VICODIN)  methocarbamol  (ROBAXIN )   DO NOT TAKE YOUR ASPIRIN  THE MORNING OF SURGERY.    One week prior to surgery, STOP taking any Aleve , Naproxen , Ibuprofen , Motrin , Advil , Goody's, BC's, all herbal medications, fish oil, and non-prescription vitamins.   WHAT DO I DO ABOUT MY DIABETES MEDICATION?   Do not take oral diabetes medicines (pills) the morning of surgery.        DO NOT TAKE YOUR Semaglutide ,0.25 or 0.5MG /DOS, (OZEMPIC )  7 DAYS PRIOR TO SURGERY WITH THE LAST DOSE BEING NO LATER THAN 06/10/2024.   The day of surgery, do not take other diabetes injectables, including Byetta (exenatide), Bydureon (exenatide ER), Victoza (liraglutide), or Trulicity (dulaglutide).   HOW TO MANAGE YOUR DIABETES BEFORE AND AFTER SURGERY  Why is it important to control my blood sugar before and after surgery? Improving blood sugar levels before and after surgery helps healing and can limit problems. A way of improving blood  sugar control is eating a healthy diet by:  Eating less sugar and carbohydrates  Increasing activity/exercise  Talking with your doctor about reaching your blood sugar goals High blood sugars (greater than 180 mg/dL) can raise your risk of infections and slow your recovery, so you will need to focus on controlling your diabetes during the weeks before surgery. Make sure that the doctor who takes care of your diabetes knows about your planned surgery including the date and location.  How do I manage my blood sugar before surgery? Check your blood sugar at least 4 times a day, starting 2 days before surgery, to make sure that the level is not too high or low.  Check your blood sugar the morning of your surgery when you wake up and every 2 hours until you get to the Short Stay unit.  If your blood sugar is less than 70 mg/dL, you will need to treat for low blood sugar: Do not take insulin . Treat a low blood sugar (less than 70 mg/dL) with  cup of clear juice (cranberry or apple), 4 glucose tablets, OR glucose gel. Recheck blood sugar in 15 minutes after treatment (to make sure it is greater than 70 mg/dL). If your blood sugar is not greater than 70 mg/dL on recheck, call [phone] for further instructions. Report your blood sugar to the short stay nurse when you get to Short Stay.  If you are admitted to the hospital after surgery: Your blood sugar will be checked by the staff and you will probably be given insulin  after surgery (instead of oral diabetes medicines) to make sure you have good blood sugar levels.  The goal for blood sugar control after surgery is 80-180 mg/dL.                      Do NOT Smoke (Tobacco/Vaping) for 24 hours prior to your procedure.  If you use a CPAP at night, you may bring your mask/headgear for your overnight stay.   You will be asked to remove any contacts, glasses, piercing's, hearing aid's, dentures/partials prior to surgery. Please bring cases for  these items if needed.    Patients discharged the day of surgery will not be allowed to drive home, and someone needs to stay with them for 24 hours.  SURGICAL WAITING ROOM VISITATION Patients may have no more than 2 support people in the waiting area - these visitors may rotate.   Pre-op nurse will coordinate an appropriate time for 1 ADULT support person, who may not rotate, to accompany patient in pre-op.  Children under the age of 42 must have an adult with them who is not the patient and must remain in the main waiting area with an adult.  If the patient needs to stay at the hospital during part of their recovery, the visitor guidelines for inpatient rooms apply.  Please refer to the [facility] website for the visitor guidelines for any additional information.   If you received a COVID test during your pre-op visit  it is requested that you wear a mask when out in public, stay away from anyone that may not be feeling well and notify your surgeon if you develop symptoms. If you have been in contact with anyone that has tested positive in the last 10 days please notify you surgeon.      Pre-operative CHG Bathing Instructions   You can play a key role in reducing the risk of infection after surgery. Your skin needs to be as free of germs as possible. You can reduce the number of germs on your skin by washing with CHG (chlorhexidine  gluconate) soap before surgery. CHG is an antiseptic soap that kills germs and continues to kill germs even after washing.   DO NOT use if you have an allergy to chlorhexidine /CHG or antibacterial soaps. If your skin becomes reddened or irritated, stop using the CHG and notify one of our RNs at [phone].              TAKE A SHOWER THE NIGHT BEFORE SURGERY AND THE DAY OF SURGERY    Please keep in mind the following:  DO NOT shave, including legs and underarms, 48 hours prior to surgery.   Place clean sheets on your bed the night before surgery Use a  clean washcloth (not used since being washed) for each shower. DO NOT sleep with pet's night before surgery.  CHG Shower Instructions:  Wash your face and private area with normal soap. If you choose to wash your hair, wash first with your normal shampoo.  After you use shampoo/soap, rinse your hair and body thoroughly to remove shampoo/soap residue.  Turn the water OFF and apply half the bottle of CHG soap to a CLEAN washcloth.  Apply CHG soap ONLY FROM YOUR NECK DOWN TO YOUR TOES (washing for 3-5 minutes)  DO NOT use CHG soap on face, private areas, open wounds, or sores.  Pay special attention to the area where your surgery is being performed.  If you are having back surgery, having someone wash your back for you may be helpful. Wait 2 minutes after CHG soap  is applied, then you may rinse off the CHG soap.  Pat dry with a clean towel  Put on clean pajamas    Additional instructions for the day of surgery: DO NOT APPLY any lotions, deodorants or perfumes.   Do not wear jewelry or makeup Do not wear nail polish, gel polish, artificial nails, or any other type of covering on natural nails (fingers and toes) Do not bring valuables to the hospital. [facility] is not responsible for valuables/personal belongings. Put on clean/comfortable clothes.  Please brush your teeth.  Ask your nurse before applying any prescription medications to the skin.

## 2024-06-14 ENCOUNTER — Telehealth: Payer: Self-pay

## 2024-06-14 ENCOUNTER — Inpatient Hospital Stay (HOSPITAL_COMMUNITY)
Admission: RE | Admit: 2024-06-14 | Discharge: 2024-06-14 | Disposition: A | Discharge status: Home / Self Care | Source: Ambulatory Visit

## 2024-06-14 NOTE — Telephone Encounter (Signed)
 Copied from CRM [id number]. Topic: Clinical - Prescription Issue >> Jun 14, 2024  9:42 AM Peggy Warren wrote: Reason for CRM: Pt was prescribed Continuous Glucose Receiver (DEXCOM G7 RECEIVER) DEVI  But it was denied by the insurance. Pt says it was denied because he did not send what was needed to be sent. Did not further explain what's needed. Please advise

## 2024-06-14 NOTE — Telephone Encounter (Signed)
 Copied from CRM [id number]. Topic: Clinical - Medication Prior Auth >> Jun 14, 2024  9:38 AM Antwanette L wrote: Reason for CRM: Patient daughterETTER raisin ) is calling to get an update on the patient G7 sensor Dexcom. [employer] sent a fax on 8/27 and it was received today. Insurance provider denied the claim bc the provider didn't provider didn't send in information. raisin is requesting a callback at [phone]

## 2024-06-14 NOTE — Progress Notes (Signed)
 Stress test has been scheduled for pt on 06/20/24, no earlier appts. Spoke to surgeon's office--pt will need to postpone surgery until stress test complete. Called pt to cancel pre-op appt, aware of plan. Awaiting new surgery date/time.

## 2024-06-18 ENCOUNTER — Other Ambulatory Visit (HOSPITAL_COMMUNITY): Payer: Self-pay

## 2024-06-18 ENCOUNTER — Other Ambulatory Visit: Payer: Self-pay

## 2024-06-18 ENCOUNTER — Inpatient Hospital Stay: Admitting: Licensed Clinical Social Worker

## 2024-06-18 MED ORDER — BLOOD GLUCOSE TEST VI STRP: 1.0000 | ORAL_STRIP | Freq: Three times a day (TID) | 0 refills | Status: AC

## 2024-06-18 MED ORDER — LANCET DEVICE MISC: 1.0000 | Freq: Three times a day (TID) | 0 refills | Status: AC

## 2024-06-18 MED ORDER — BLOOD GLUCOSE MONITORING SUPPL DEVI: 1.0000 | Freq: Three times a day (TID) | 0 refills | Status: DC

## 2024-06-18 MED ORDER — LANCETS MISC. MISC: 1.0000 | Freq: Three times a day (TID) | 0 refills | Status: AC

## 2024-06-18 NOTE — Telephone Encounter (Signed)
 Called and spoke with patient's daughter. This has since been resolved

## 2024-06-18 NOTE — Telephone Encounter (Signed)
 Resolved in separate note

## 2024-06-18 NOTE — Telephone Encounter (Signed)
 Called and spoke with patient's insurance provider. Confirmed with them the reason for the denial.  Spoke with [name] and went over denial reason as well. Have since placed a script for a normal glucometer [facility]

## 2024-06-19 ENCOUNTER — Other Ambulatory Visit: Payer: Self-pay | Admitting: Thoracic Surgery (Cardiothoracic Vascular Surgery)

## 2024-06-19 DIAGNOSIS — C3411 Malignant neoplasm of upper lobe, right bronchus or lung: Secondary | ICD-10-CM

## 2024-06-19 DIAGNOSIS — R918 Other nonspecific abnormal finding of lung field: Secondary | ICD-10-CM

## 2024-06-19 NOTE — Progress Notes (Signed)
 Surgical Instructions     Your procedure is scheduled on Monday June 21 2024. Report to [facility] Main Entrance A at 11:15 A.M., then check in with the Admitting office. Any questions or running late day of surgery: call [phone]   Questions prior to your surgery date: call [phone], Monday-Friday, 8am-4pm. If you experience any cold or flu symptoms such as cough, fever, chills, shortness of breath, etc. between now and your scheduled surgery, please notify us  at the above number.            Remember:       Do not eat or drink after midnight the night before your surgery        Take these medicines the morning of surgery with A SIP OF WATER  amLODipine  (NORVASC )  amoxicillin -clavulanate (AUGMENTIN )  atorvastatin  (LIPITOR)  umeclidinium-vilanterol (ANORO ELLIPTA )  varenicline  (CHANTIX )    May take these medicines IF NEEDED: acetaminophen  (TYLENOL )  albuterol  (VENTOLIN  HFA) 108 (90 Base) MCG/ACT inhaler Please bring with you to the hospital HYDROcodone -acetaminophen  (NORCO/VICODIN)  methocarbamol  (ROBAXIN )    DO NOT TAKE YOUR ASPIRIN  THE MORNING OF SURGERY.      One week prior to surgery, STOP taking any Aleve , Naproxen , Ibuprofen , Motrin , Advil , Goody's, BC's, all herbal medications, fish oil, and non-prescription vitamins.     WHAT DO I DO ABOUT MY DIABETES MEDICATION?     Do not take oral diabetes medicines (pills) the morning of surgery.         DO NOT TAKE YOUR Semaglutide ,0.25 or 0.5MG /DOS, (OZEMPIC )  7 DAYS PRIOR TO SURGERY WITH THE LAST DOSE BEING NO LATER THAN 06/10/2024.    The day of surgery, do not take other diabetes injectables, including Byetta (exenatide), Bydureon (exenatide ER), Victoza (liraglutide), or Trulicity (dulaglutide).     HOW TO MANAGE YOUR DIABETES BEFORE AND AFTER SURGERY   Why is it important to control my blood sugar before and after surgery? Improving blood sugar levels before and after surgery helps healing and can limit  problems. A way of improving blood sugar control is eating a healthy diet by:  Eating less sugar and carbohydrates  Increasing activity/exercise  Talking with your doctor about reaching your blood sugar goals High blood sugars (greater than 180 mg/dL) can raise your risk of infections and slow your recovery, so you will need to focus on controlling your diabetes during the weeks before surgery. Make sure that the doctor who takes care of your diabetes knows about your planned surgery including the date and location.   How do I manage my blood sugar before surgery? Check your blood sugar at least 4 times a day, starting 2 days before surgery, to make sure that the level is not too high or low.   Check your blood sugar the morning of your surgery when you wake up and every 2 hours until you get to the Short Stay unit.   If your blood sugar is less than 70 mg/dL, you will need to treat for low blood sugar: Do not take insulin . Treat a low blood sugar (less than 70 mg/dL) with  cup of clear juice (cranberry or apple), 4 glucose tablets, OR glucose gel. Recheck blood sugar in 15 minutes after treatment (to make sure it is greater than 70 mg/dL). If your blood sugar is not greater than 70 mg/dL on recheck, call [phone] for further instructions. Report your blood sugar to the short stay nurse when you get to Short Stay.   If you are admitted  to the hospital after surgery: Your blood sugar will be checked by the staff and you will probably be given insulin  after surgery (instead of oral diabetes medicines) to make sure you have good blood sugar levels. The goal for blood sugar control after surgery is 80-180 mg/dL.                       Do NOT Smoke (Tobacco/Vaping) for 24 hours prior to your procedure.   If you use a CPAP at night, you may bring your mask/headgear for your overnight stay.   You will be asked to remove any contacts, glasses, piercing's, hearing aid's, dentures/partials  prior to surgery. Please bring cases for these items if needed.    Patients discharged the day of surgery will not be allowed to drive home, and someone needs to stay with them for 24 hours.   SURGICAL WAITING ROOM VISITATION Patients may have no more than 2 support people in the waiting area - these visitors may rotate.   Pre-op nurse will coordinate an appropriate time for 1 ADULT support person, who may not rotate, to accompany patient in pre-op.  Children under the age of 56 must have an adult with them who is not the patient and must remain in the main waiting area with an adult.   If the patient needs to stay at the hospital during part of their recovery, the visitor guidelines for inpatient rooms apply.   Please refer to the [facility] website for the visitor guidelines for any additional information.     If you received a COVID test during your pre-op visit  it is requested that you wear a mask when out in public, stay away from anyone that may not be feeling well and notify your surgeon if you develop symptoms. If you have been in contact with anyone that has tested positive in the last 10 days please notify you surgeon.         Pre-operative CHG Bathing Instructions    You can play a key role in reducing the risk of infection after surgery. Your skin needs to be as free of germs as possible. You can reduce the number of germs on your skin by washing with CHG (chlorhexidine  gluconate) soap before surgery. CHG is an antiseptic soap that kills germs and continues to kill germs even after washing.    DO NOT use if you have an allergy to chlorhexidine /CHG or antibacterial soaps. If your skin becomes reddened or irritated, stop using the CHG and notify one of our RNs at [phone].               TAKE A SHOWER THE NIGHT BEFORE SURGERY AND THE DAY OF SURGERY     Please keep in mind the following:  DO NOT shave, including legs and underarms, 48 hours prior to surgery.   Place clean  sheets on your bed the night before surgery Use a clean washcloth (not used since being washed) for each shower. DO NOT sleep with pet's night before surgery.   CHG Shower Instructions:  Wash your face and private area with normal soap. If you choose to wash your hair, wash first with your normal shampoo.  After you use shampoo/soap, rinse your hair and body thoroughly to remove shampoo/soap residue.  Turn the water OFF and apply half the bottle of CHG soap to a CLEAN washcloth.  Apply CHG soap ONLY FROM YOUR NECK DOWN TO YOUR TOES (washing for  3-5 minutes)  DO NOT use CHG soap on face, private areas, open wounds, or sores.  Pay special attention to the area where your surgery is being performed.  If you are having back surgery, having someone wash your back for you may be helpful. Wait 2 minutes after CHG soap is applied, then you may rinse off the CHG soap.  Pat dry with a clean towel  Put on clean pajamas     Additional instructions for the day of surgery: DO NOT APPLY any lotions, deodorants or perfumes.   Do not wear jewelry or makeup Do not wear nail polish, gel polish, artificial nails, or any other type of covering on natural nails (fingers and toes) Do not bring valuables to the hospital. [facility] is not responsible for valuables/personal belongings. Put on clean/comfortable clothes.  Please brush your teeth.  Ask your nurse before applying any prescription medications to the skin.

## 2024-06-20 ENCOUNTER — Ambulatory Visit (HOSPITAL_COMMUNITY)
Admission: RE | Admit: 2024-06-20 | Discharge: 2024-06-20 | Disposition: A | Discharge status: Home / Self Care | Source: Ambulatory Visit | Attending: Thoracic Surgery (Cardiothoracic Vascular Surgery) | Admitting: Thoracic Surgery (Cardiothoracic Vascular Surgery)

## 2024-06-20 ENCOUNTER — Ambulatory Visit (HOSPITAL_BASED_OUTPATIENT_CLINIC_OR_DEPARTMENT_OTHER)
Admission: RE | Admit: 2024-06-20 | Discharge: 2024-06-20 | Disposition: A | Discharge status: Home / Self Care | Source: Ambulatory Visit | Attending: Cardiovascular Disease | Admitting: Cardiovascular Disease

## 2024-06-20 ENCOUNTER — Encounter (HOSPITAL_COMMUNITY)
Admission: RE | Admit: 2024-06-20 | Discharge: 2024-06-20 | Disposition: A | Discharge status: Home / Self Care | Source: Ambulatory Visit | Attending: Cardiovascular Disease | Admitting: Cardiovascular Disease

## 2024-06-20 ENCOUNTER — Encounter (HOSPITAL_COMMUNITY): Payer: Self-pay

## 2024-06-20 ENCOUNTER — Other Ambulatory Visit: Payer: Self-pay

## 2024-06-20 VITALS — BP 158/86 | HR 72 | Temp 98.3°F | Resp 18 | Ht 62.0 in | Wt 191.0 lb

## 2024-06-20 DIAGNOSIS — Z87891 Personal history of nicotine dependence: Secondary | ICD-10-CM | POA: Insufficient documentation

## 2024-06-20 DIAGNOSIS — M1611 Unilateral primary osteoarthritis, right hip: Secondary | ICD-10-CM | POA: Insufficient documentation

## 2024-06-20 DIAGNOSIS — R918 Other nonspecific abnormal finding of lung field: Secondary | ICD-10-CM | POA: Diagnosis not present

## 2024-06-20 DIAGNOSIS — E119 Type 2 diabetes mellitus without complications: Secondary | ICD-10-CM | POA: Diagnosis not present

## 2024-06-20 DIAGNOSIS — I2510 Atherosclerotic heart disease of native coronary artery without angina pectoris: Secondary | ICD-10-CM | POA: Insufficient documentation

## 2024-06-20 DIAGNOSIS — Z01810 Encounter for preprocedural cardiovascular examination: Secondary | ICD-10-CM | POA: Diagnosis not present

## 2024-06-20 DIAGNOSIS — C3492 Malignant neoplasm of unspecified part of left bronchus or lung: Secondary | ICD-10-CM | POA: Diagnosis not present

## 2024-06-20 DIAGNOSIS — C3491 Malignant neoplasm of unspecified part of right bronchus or lung: Secondary | ICD-10-CM | POA: Diagnosis not present

## 2024-06-20 DIAGNOSIS — J449 Chronic obstructive pulmonary disease, unspecified: Secondary | ICD-10-CM | POA: Diagnosis not present

## 2024-06-20 DIAGNOSIS — C3411 Malignant neoplasm of upper lobe, right bronchus or lung: Secondary | ICD-10-CM | POA: Insufficient documentation

## 2024-06-20 DIAGNOSIS — I700 Atherosclerosis of aorta: Secondary | ICD-10-CM | POA: Insufficient documentation

## 2024-06-20 DIAGNOSIS — J439 Emphysema, unspecified: Secondary | ICD-10-CM | POA: Insufficient documentation

## 2024-06-20 DIAGNOSIS — Z01818 Encounter for other preprocedural examination: Secondary | ICD-10-CM

## 2024-06-20 DIAGNOSIS — K760 Fatty (change of) liver, not elsewhere classified: Secondary | ICD-10-CM | POA: Insufficient documentation

## 2024-06-20 HISTORY — DX: Transient cerebral ischemic attack, unspecified: G45.9

## 2024-06-20 LAB — TYPE AND SCREEN
ABO/RH(D): O POS
Antibody Screen: NEGATIVE

## 2024-06-20 LAB — URINALYSIS, ROUTINE W REFLEX MICROSCOPIC
Bilirubin Urine: NEGATIVE
Glucose, UA: NEGATIVE mg/dL
Hgb urine dipstick: NEGATIVE
Ketones, ur: NEGATIVE mg/dL
Leukocytes,Ua: NEGATIVE
Nitrite: NEGATIVE
Protein, ur: NEGATIVE mg/dL
Specific Gravity, Urine: 1.003 — ABNORMAL LOW (ref 1.005–1.030)
pH: 7 (ref 5.0–8.0)

## 2024-06-20 LAB — MYOCARDIAL PERFUSION IMAGING
Base ST Depression (mm): 0 mm
LV dias vol: 102 mL (ref 46–106)
LV sys vol: 26 mL (ref 3.8–5.2)
Nuc Stress EF: 75 %
Peak HR: 92 {beats}/min
Rest HR: 66 {beats}/min
Rest Nuclear Isotope Dose: 10.7 mCi
SDS: 0
SRS: 1
SSS: 1
ST Depression (mm): 0 mm
Stress Nuclear Isotope Dose: 31.8 mCi
TID: 0.95

## 2024-06-20 LAB — CBC
HCT: 39.6 % (ref 36.0–46.0)
Hemoglobin: 13.3 g/dL (ref 12.0–15.0)
MCH: 33 pg (ref 26.0–34.0)
MCHC: 33.6 g/dL (ref 30.0–36.0)
MCV: 98.3 fL (ref 80.0–100.0)
Platelets: 304 K/uL (ref 150–400)
RBC: 4.03 MIL/uL (ref 3.87–5.11)
RDW: 12.2 % (ref 11.5–15.5)
WBC: 8.8 K/uL (ref 4.0–10.5)
nRBC: 0 % (ref 0.0–0.2)

## 2024-06-20 LAB — COMPREHENSIVE METABOLIC PANEL WITH GFR
ALT: 24 U/L (ref 0–44)
AST: 44 U/L — ABNORMAL HIGH (ref 15–41)
Albumin: 3.6 g/dL (ref 3.5–5.0)
Alkaline Phosphatase: 64 U/L (ref 38–126)
Anion gap: 12 (ref 5–15)
BUN: <5 mg/dL — ABNORMAL LOW (ref 6–20)
CO2: 25 mmol/L (ref 22–32)
Calcium: 9.1 mg/dL (ref 8.9–10.3)
Chloride: 101 mmol/L (ref 98–111)
Creatinine, Ser: 0.57 mg/dL (ref 0.44–1.00)
GFR, Estimated: >60 mL/min (ref >60)
Glucose, Bld: 126 mg/dL — ABNORMAL HIGH (ref 70–99)
Potassium: 3.3 mmol/L — ABNORMAL LOW (ref 3.5–5.1)
Sodium: 138 mmol/L (ref 135–145)
Total Bilirubin: 0.8 mg/dL (ref 0.0–1.2)
Total Protein: 7.4 g/dL (ref 6.5–8.1)

## 2024-06-20 LAB — SURGICAL PCR SCREEN
MRSA, PCR: NEGATIVE
Staphylococcus aureus: NEGATIVE

## 2024-06-20 LAB — PROTIME-INR
INR: 1 (ref 0.8–1.2)
Prothrombin Time: 14.2 s (ref 11.4–15.2)

## 2024-06-20 LAB — GLUCOSE, CAPILLARY: Glucose-Capillary: 142 mg/dL — ABNORMAL HIGH (ref 70–99)

## 2024-06-20 LAB — APTT: aPTT: 36 s (ref 24–36)

## 2024-06-20 MED ORDER — TECHNETIUM TC 99M TETROFOSMIN IV KIT
10.7000 | PACK | Freq: Once | INTRAVENOUS | Status: AC | PRN
  Administered 2024-06-20: 10.7 via INTRAVENOUS

## 2024-06-20 MED ORDER — TECHNETIUM TC 99M TETROFOSMIN IV KIT
31.8000 | PACK | Freq: Once | INTRAVENOUS | Status: AC | PRN
  Administered 2024-06-20: 31.8 via INTRAVENOUS

## 2024-06-20 MED ORDER — REGADENOSON 0.4 MG/5ML IV SOLN
INTRAVENOUS | Status: AC
  Filled 2024-06-20: qty 5

## 2024-06-20 MED ORDER — REGADENOSON 0.4 MG/5ML IV SOLN
0.4000 mg | Freq: Once | INTRAVENOUS | Status: AC
  Administered 2024-06-20: 0.4 mg via INTRAVENOUS

## 2024-06-20 NOTE — Progress Notes (Addendum)
 Surgical Instructions     Your procedure is scheduled on Thursday June 21 2024. Report to [facility] Main Entrance A at 10:45 A.M., then check in with the Admitting office. Any questions or running late day of surgery: call [phone]   Questions prior to your surgery date: call [phone], Monday-Friday, 8am-4pm. If you experience any cold or flu symptoms such as cough, fever, chills, shortness of breath, etc. between now and your scheduled surgery, please notify us  at the above number.            Remember:       Do not eat or drink after midnight the night before your surgery        Take these medicines the morning of surgery with A SIP OF WATER  amLODipine  (NORVASC )  amoxicillin -clavulanate (AUGMENTIN )  atorvastatin  (LIPITOR)  umeclidinium-vilanterol (ANORO ELLIPTA )  varenicline  (CHANTIX )    May take these medicines IF NEEDED: acetaminophen  (TYLENOL )  albuterol  (VENTOLIN  HFA) 108 (90 Base) MCG/ACT inhaler Please bring with you to the hospital HYDROcodone -acetaminophen  (NORCO/VICODIN)  methocarbamol  (ROBAXIN )    DO NOT TAKE YOUR ASPIRIN  THE MORNING OF SURGERY.      One week prior to surgery, STOP taking any Aleve , Naproxen , Ibuprofen , Motrin , Advil , Goody's, BC's, all herbal medications, fish oil, and non-prescription vitamins.     WHAT DO I DO ABOUT MY DIABETES MEDICATION?     Do not take oral diabetes medicines (pills) the morning of surgery.         DO NOT TAKE YOUR Semaglutide ,0.25 or 0.5MG /DOS, (OZEMPIC )  7 DAYS PRIOR TO SURGERY WITH THE LAST DOSE BEING NO LATER THAN 06/10/2024.    The day of surgery, do not take other diabetes injectables, including Byetta (exenatide), Bydureon (exenatide ER), Victoza (liraglutide), or Trulicity (dulaglutide).     HOW TO MANAGE YOUR DIABETES BEFORE AND AFTER SURGERY   Why is it important to control my blood sugar before and after surgery? Improving blood sugar levels before and after surgery helps healing and can limit  problems. A way of improving blood sugar control is eating a healthy diet by:  Eating less sugar and carbohydrates  Increasing activity/exercise  Talking with your doctor about reaching your blood sugar goals High blood sugars (greater than 180 mg/dL) can raise your risk of infections and slow your recovery, so you will need to focus on controlling your diabetes during the weeks before surgery. Make sure that the doctor who takes care of your diabetes knows about your planned surgery including the date and location.   How do I manage my blood sugar before surgery? Check your blood sugar at least 4 times a day, starting 2 days before surgery, to make sure that the level is not too high or low.   Check your blood sugar the morning of your surgery when you wake up and every 2 hours until you get to the Short Stay unit.   If your blood sugar is less than 70 mg/dL, you will need to treat for low blood sugar: Do not take insulin . Treat a low blood sugar (less than 70 mg/dL) with  cup of clear juice (cranberry or apple), 4 glucose tablets, OR glucose gel. Recheck blood sugar in 15 minutes after treatment (to make sure it is greater than 70 mg/dL). If your blood sugar is not greater than 70 mg/dL on recheck, call [phone] for further instructions. Report your blood sugar to the short stay nurse when you get to Short Stay.   If you are admitted  to the hospital after surgery: Your blood sugar will be checked by the staff and you will probably be given insulin  after surgery (instead of oral diabetes medicines) to make sure you have good blood sugar levels. The goal for blood sugar control after surgery is 80-180 mg/dL.                       Do NOT Smoke (Tobacco/Vaping) for 24 hours prior to your procedure.   If you use a CPAP at night, you may bring your mask/headgear for your overnight stay.   You will be asked to remove any contacts, glasses, piercing's, hearing aid's, dentures/partials  prior to surgery. Please bring cases for these items if needed.    Patients discharged the day of surgery will not be allowed to drive home, and someone needs to stay with them for 24 hours.   SURGICAL WAITING ROOM VISITATION Patients may have no more than 2 support people in the waiting area - these visitors may rotate.   Pre-op nurse will coordinate an appropriate time for 1 ADULT support person, who may not rotate, to accompany patient in pre-op.  Children under the age of 36 must have an adult with them who is not the patient and must remain in the main waiting area with an adult.   If the patient needs to stay at the hospital during part of their recovery, the visitor guidelines for inpatient rooms apply.   Please refer to the [facility] website for the visitor guidelines for any additional information.     If you received a COVID test during your pre-op visit  it is requested that you wear a mask when out in public, stay away from anyone that may not be feeling well and notify your surgeon if you develop symptoms. If you have been in contact with anyone that has tested positive in the last 10 days please notify you surgeon.         Pre-operative CHG Bathing Instructions    You can play a key role in reducing the risk of infection after surgery. Your skin needs to be as free of germs as possible. You can reduce the number of germs on your skin by washing with CHG (chlorhexidine  gluconate) soap before surgery. CHG is an antiseptic soap that kills germs and continues to kill germs even after washing.    DO NOT use if you have an allergy to chlorhexidine /CHG or antibacterial soaps. If your skin becomes reddened or irritated, stop using the CHG and notify one of our RNs at [phone].               TAKE A SHOWER THE NIGHT BEFORE SURGERY AND THE DAY OF SURGERY     Please keep in mind the following:  DO NOT shave, including legs and underarms, 48 hours prior to surgery.   Place clean  sheets on your bed the night before surgery Use a clean washcloth (not used since being washed) for each shower. DO NOT sleep with pet's night before surgery.   CHG Shower Instructions:  Wash your face and private area with normal soap. If you choose to wash your hair, wash first with your normal shampoo.  After you use shampoo/soap, rinse your hair and body thoroughly to remove shampoo/soap residue.  Turn the water OFF and apply half the bottle of CHG soap to a CLEAN washcloth.  Apply CHG soap ONLY FROM YOUR NECK DOWN TO YOUR TOES (washing for  3-5 minutes)  DO NOT use CHG soap on face, private areas, open wounds, or sores.  Pay special attention to the area where your surgery is being performed.  If you are having back surgery, having someone wash your back for you may be helpful. Wait 2 minutes after CHG soap is applied, then you may rinse off the CHG soap.  Pat dry with a clean towel  Put on clean pajamas     Additional instructions for the day of surgery: DO NOT APPLY any lotions, deodorants or perfumes.   Do not wear jewelry or makeup Do not wear nail polish, gel polish, artificial nails, or any other type of covering on natural nails (fingers and toes) Do not bring valuables to the hospital. [facility] is not responsible for valuables/personal belongings. Put on clean/comfortable clothes.  Please brush your teeth.  Ask your nurse before applying any prescription medications to the skin.
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
## 2024-06-20 NOTE — Progress Notes (Addendum)
 PCP -  Dr Lynwood Rush Cardiologist -  Pulmonology - Isadora Hose, MD   PPM/ICD - denies Device Orders -  Rep Notified -   Chest x-ray - 06-20-24 EKG - 06-20-24 Stress Test - 06-20-24 ECHO - 07-29-23 Cardiac Cath -  PFT- 05-07-24  Sleep Study - denies CPAP - n/a  Fasting Blood Sugar - per patient blood sugars range 120-130. Blood sugar was 144 at PAT. Checks Blood Sugar daily A1c on 05-22-24 6.1  Last dose of GLP1 agonist-  OZEMPIC , has not started yet   Blood Thinner Instructions:denies Aspirin  Instructions:last dose 06-20-24  ERAS Protcol -NPO  COVID TEST- n/a   Anesthesia review: yes Hx, HTN, DM , COPD  Patient denies shortness of breath, fever, cough and chest pain at PAT appointment   All instructions explained to the patient, with a verbal understanding of the material. Patient agrees to go over the instructions while at home for a better understanding. Patient also instructed to self quarantine after being tested for COVID-19. The opportunity to ask questions was provided.

## 2024-06-20 NOTE — Anesthesia Preprocedure Evaluation (Signed)
 Anesthesia Evaluation  Patient identified by MRN, date of birth, ID band Patient awake    Reviewed: Allergy & Precautions, NPO status , Patient's Chart, lab work & pertinent test results  Airway Mallampati: II  TM Distance: >3 FB Neck ROM: Full    Dental  (+) Dental Advisory Given   Pulmonary COPD, Patient abstained from smoking., former smoker   breath sounds clear to auscultation       Cardiovascular hypertension, + CAD   Rhythm:Regular Rate:Normal  Nuclear stress test 06/20/2024:   The study is normal. Findings are consistent with no ischemia and no infarction. The study is low risk.   No ST deviation was noted.   LV perfusion is normal. There is no evidence of ischemia. There is no evidence of infarction.   Left ventricular function is normal. Nuclear stress EF: 75%. The left ventricular ejection fraction is hyperdynamic (>65%). End diastolic cavity size is normal. End systolic cavity size is normal.   CT images were obtained for attenuation correction and were examined for the presence of coronary calcium  when appropriate.   Coronary calcium  was absent on the attenuation correction CT images.  Aortic atherosclerosis.      Neuro/Psych TIA   GI/Hepatic Neg liver ROS,GERD  ,,  Endo/Other  diabetes, Type 2    Renal/GU negative Renal ROS     Musculoskeletal  (+) Arthritis ,    Abdominal   Peds  Hematology negative hematology ROS (+)   Anesthesia Other Findings   Reproductive/Obstetrics                              Anesthesia Physical Anesthesia Plan  ASA: 3  Anesthesia Plan: General   Post-op Pain Management: Tylenol  PO (pre-op)*   Induction: Intravenous  PONV Risk Score and Plan: 2 and Dexamethasone , Ondansetron  and Treatment may vary due to age or medical condition  Airway Management Planned: Double Lumen EBT and Fiberoptic Intubation Planned  Additional Equipment:  Arterial line  Intra-op Plan:   Post-operative Plan: Extubation in OR  Informed Consent: I have reviewed the patients History and Physical, chart, labs and discussed the procedure including the risks, benefits and alternatives for the proposed anesthesia with the patient or authorized representative who has indicated his/her understanding and acceptance.     Dental advisory given  Plan Discussed with: CRNA  Anesthesia Plan Comments: (PAT note written 06/20/2024 by Dondrell Loudermilk, PA-C.  )         Anesthesia Quick Evaluation

## 2024-06-21 ENCOUNTER — Inpatient Hospital Stay (HOSPITAL_COMMUNITY)

## 2024-06-21 ENCOUNTER — Other Ambulatory Visit: Payer: Self-pay

## 2024-06-21 ENCOUNTER — Inpatient Hospital Stay (HOSPITAL_COMMUNITY)
Admission: RE | Admit: 2024-06-21 | Discharge: 2024-06-25 | DRG: 164 | Disposition: A | Discharge status: Home / Self Care | Attending: Thoracic Surgery (Cardiothoracic Vascular Surgery) | Admitting: Thoracic Surgery (Cardiothoracic Vascular Surgery)

## 2024-06-21 ENCOUNTER — Telehealth: Payer: Self-pay

## 2024-06-21 ENCOUNTER — Encounter (HOSPITAL_COMMUNITY): Payer: Self-pay | Admitting: Thoracic Surgery (Cardiothoracic Vascular Surgery)

## 2024-06-21 ENCOUNTER — Inpatient Hospital Stay (HOSPITAL_COMMUNITY): Payer: Self-pay | Admitting: Certified Registered Nurse Anesthetist

## 2024-06-21 ENCOUNTER — Encounter (HOSPITAL_COMMUNITY)
Admission: RE | Disposition: A | Discharge status: Home / Self Care | Payer: Self-pay | Source: Home / Self Care | Attending: Thoracic Surgery (Cardiothoracic Vascular Surgery)

## 2024-06-21 DIAGNOSIS — Z9889 Other specified postprocedural states: Principal | ICD-10-CM

## 2024-06-21 DIAGNOSIS — F17210 Nicotine dependence, cigarettes, uncomplicated: Secondary | ICD-10-CM | POA: Diagnosis present

## 2024-06-21 DIAGNOSIS — M1611 Unilateral primary osteoarthritis, right hip: Secondary | ICD-10-CM | POA: Diagnosis present

## 2024-06-21 DIAGNOSIS — Z6834 Body mass index (BMI) 34.0-34.9, adult: Secondary | ICD-10-CM | POA: Diagnosis not present

## 2024-06-21 DIAGNOSIS — E669 Obesity, unspecified: Secondary | ICD-10-CM | POA: Diagnosis not present

## 2024-06-21 DIAGNOSIS — R001 Bradycardia, unspecified: Secondary | ICD-10-CM | POA: Diagnosis present

## 2024-06-21 DIAGNOSIS — J939 Pneumothorax, unspecified: Secondary | ICD-10-CM | POA: Diagnosis not present

## 2024-06-21 DIAGNOSIS — Z8673 Personal history of transient ischemic attack (TIA), and cerebral infarction without residual deficits: Secondary | ICD-10-CM

## 2024-06-21 DIAGNOSIS — I700 Atherosclerosis of aorta: Secondary | ICD-10-CM | POA: Diagnosis present

## 2024-06-21 DIAGNOSIS — Z8701 Personal history of pneumonia (recurrent): Secondary | ICD-10-CM

## 2024-06-21 DIAGNOSIS — E1165 Type 2 diabetes mellitus with hyperglycemia: Secondary | ICD-10-CM | POA: Diagnosis present

## 2024-06-21 DIAGNOSIS — I10 Essential (primary) hypertension: Secondary | ICD-10-CM

## 2024-06-21 DIAGNOSIS — M5430 Sciatica, unspecified side: Secondary | ICD-10-CM | POA: Diagnosis not present

## 2024-06-21 DIAGNOSIS — Z8249 Family history of ischemic heart disease and other diseases of the circulatory system: Secondary | ICD-10-CM

## 2024-06-21 DIAGNOSIS — Z79899 Other long term (current) drug therapy: Secondary | ICD-10-CM | POA: Diagnosis not present

## 2024-06-21 DIAGNOSIS — Z7982 Long term (current) use of aspirin: Secondary | ICD-10-CM

## 2024-06-21 DIAGNOSIS — J90 Pleural effusion, not elsewhere classified: Secondary | ICD-10-CM | POA: Diagnosis not present

## 2024-06-21 DIAGNOSIS — J984 Other disorders of lung: Secondary | ICD-10-CM | POA: Diagnosis not present

## 2024-06-21 DIAGNOSIS — R590 Localized enlarged lymph nodes: Secondary | ICD-10-CM | POA: Diagnosis present

## 2024-06-21 DIAGNOSIS — K760 Fatty (change of) liver, not elsewhere classified: Secondary | ICD-10-CM | POA: Diagnosis present

## 2024-06-21 DIAGNOSIS — C3491 Malignant neoplasm of unspecified part of right bronchus or lung: Secondary | ICD-10-CM | POA: Diagnosis not present

## 2024-06-21 DIAGNOSIS — R918 Other nonspecific abnormal finding of lung field: Secondary | ICD-10-CM | POA: Diagnosis not present

## 2024-06-21 DIAGNOSIS — I517 Cardiomegaly: Secondary | ICD-10-CM | POA: Diagnosis not present

## 2024-06-21 DIAGNOSIS — I2510 Atherosclerotic heart disease of native coronary artery without angina pectoris: Secondary | ICD-10-CM | POA: Diagnosis present

## 2024-06-21 DIAGNOSIS — D62 Acute posthemorrhagic anemia: Secondary | ICD-10-CM | POA: Diagnosis not present

## 2024-06-21 DIAGNOSIS — Z4682 Encounter for fitting and adjustment of non-vascular catheter: Secondary | ICD-10-CM | POA: Diagnosis not present

## 2024-06-21 DIAGNOSIS — Z48813 Encounter for surgical aftercare following surgery on the respiratory system: Secondary | ICD-10-CM | POA: Diagnosis not present

## 2024-06-21 DIAGNOSIS — R0902 Hypoxemia: Secondary | ICD-10-CM | POA: Diagnosis not present

## 2024-06-21 DIAGNOSIS — K219 Gastro-esophageal reflux disease without esophagitis: Secondary | ICD-10-CM | POA: Diagnosis present

## 2024-06-21 DIAGNOSIS — C3411 Malignant neoplasm of upper lobe, right bronchus or lung: Secondary | ICD-10-CM | POA: Diagnosis not present

## 2024-06-21 DIAGNOSIS — Z87891 Personal history of nicotine dependence: Secondary | ICD-10-CM

## 2024-06-21 DIAGNOSIS — J9811 Atelectasis: Secondary | ICD-10-CM | POA: Diagnosis not present

## 2024-06-21 DIAGNOSIS — J449 Chronic obstructive pulmonary disease, unspecified: Secondary | ICD-10-CM | POA: Diagnosis present

## 2024-06-21 DIAGNOSIS — J439 Emphysema, unspecified: Secondary | ICD-10-CM | POA: Diagnosis present

## 2024-06-21 DIAGNOSIS — C3490 Malignant neoplasm of unspecified part of unspecified bronchus or lung: Secondary | ICD-10-CM

## 2024-06-21 DIAGNOSIS — Z7985 Long-term (current) use of injectable non-insulin antidiabetic drugs: Secondary | ICD-10-CM | POA: Diagnosis not present

## 2024-06-21 DIAGNOSIS — M1909 Primary osteoarthritis, other specified site: Secondary | ICD-10-CM | POA: Diagnosis present

## 2024-06-21 HISTORY — PX: LOBECTOMY, LUNG, ROBOT-ASSISTED, USING VATS: SHX7607

## 2024-06-21 HISTORY — PX: SENTINEL NODE BIOPSY: SHX6608

## 2024-06-21 HISTORY — PX: INTERCOSTAL NERVE BLOCK: SHX5021

## 2024-06-21 LAB — POCT I-STAT 7, (LYTES, BLD GAS, ICA,H+H)
Acid-base deficit: 1 mmol/L (ref 0.0–2.0)
Acid-base deficit: 2 mmol/L (ref 0.0–2.0)
Bicarbonate: 27.5 mmol/L (ref 20.0–28.0)
Bicarbonate: 27.6 mmol/L (ref 20.0–28.0)
Calcium, Ion: 1.19 mmol/L (ref 1.15–1.40)
Calcium, Ion: 1.2 mmol/L (ref 1.15–1.40)
HCT: 36 % (ref 36.0–46.0)
HCT: 39 % (ref 36.0–46.0)
Hemoglobin: 12.2 g/dL (ref 12.0–15.0)
Hemoglobin: 13.3 g/dL (ref 12.0–15.0)
O2 Saturation: 93 %
O2 Saturation: 98 %
Patient temperature: 35.3
Patient temperature: 35.3
Potassium: 2.9 mmol/L — ABNORMAL LOW (ref 3.5–5.1)
Potassium: 3.4 mmol/L — ABNORMAL LOW (ref 3.5–5.1)
Sodium: 142 mmol/L (ref 135–145)
Sodium: 144 mmol/L (ref 135–145)
TCO2: 30 mmol/L (ref 22–32)
TCO2: 30 mmol/L (ref 22–32)
pCO2 arterial: 61.8 mmHg — ABNORMAL HIGH (ref 32–48)
pCO2 arterial: 64.9 mmHg — ABNORMAL HIGH (ref 32–48)
pH, Arterial: 7.226 — ABNORMAL LOW (ref 7.35–7.45)
pH, Arterial: 7.247 — ABNORMAL LOW (ref 7.35–7.45)
pO2, Arterial: 124 mmHg — ABNORMAL HIGH (ref 83–108)
pO2, Arterial: 76 mmHg — ABNORMAL LOW (ref 83–108)

## 2024-06-21 LAB — GLUCOSE, CAPILLARY
Glucose-Capillary: 141 mg/dL — ABNORMAL HIGH (ref 70–99)
Glucose-Capillary: 136 mg/dL — ABNORMAL HIGH (ref 70–99)
Glucose-Capillary: 162 mg/dL — ABNORMAL HIGH (ref 70–99)
Glucose-Capillary: 182 mg/dL — ABNORMAL HIGH (ref 70–99)
Glucose-Capillary: 222 mg/dL — ABNORMAL HIGH (ref 70–99)

## 2024-06-21 LAB — ABO/RH: ABO/RH(D): O POS

## 2024-06-21 SURGERY — LOBECTOMY, LUNG, ROBOT-ASSISTED, USING VATS
Anesthesia: General | Site: Chest | Laterality: Right

## 2024-06-21 MED ORDER — CEFAZOLIN SODIUM-DEXTROSE 2-4 GM/100ML-% IV SOLN
2.0000 g | Freq: Three times a day (TID) | INTRAVENOUS | Status: AC
  Administered 2024-06-21 – 2024-06-22 (×2): 2 g via INTRAVENOUS
  Filled 2024-06-21 (×2): qty 100

## 2024-06-21 MED ORDER — PROPOFOL 10 MG/ML IV BOLUS
INTRAVENOUS | Status: DC | PRN
Start: 2024-06-21 — End: 2024-06-21
  Administered 2024-06-21 (×3): 100 mg via INTRAVENOUS
  Administered 2024-06-21: 50 mg via INTRAVENOUS

## 2024-06-21 MED ORDER — ONDANSETRON HCL 4 MG/2ML IJ SOLN: 4.0000 mg | Freq: Four times a day (QID) | INTRAMUSCULAR | Status: DC | PRN

## 2024-06-21 MED ORDER — BUPIVACAINE HCL (PF) 0.5 % IJ SOLN
INTRAMUSCULAR | Status: AC
  Filled 2024-06-21: qty 30

## 2024-06-21 MED ORDER — KETAMINE HCL 50 MG/5ML IJ SOSY
PREFILLED_SYRINGE | INTRAMUSCULAR | Status: AC
Start: 2024-06-21 — End: 2024-06-21
  Filled 2024-06-21: qty 5

## 2024-06-21 MED ORDER — LUNG SURGERY BOOK
Freq: Once | Status: AC
  Filled 2024-06-21: qty 1

## 2024-06-21 MED ORDER — FENTANYL CITRATE (PF) 100 MCG/2ML IJ SOLN
INTRAMUSCULAR | Status: AC
  Filled 2024-06-21: qty 2

## 2024-06-21 MED ORDER — INSULIN ASPART 100 UNIT/ML IJ SOLN: 0.0000 [IU] | INTRAMUSCULAR | Status: DC | PRN

## 2024-06-21 MED ORDER — CEFAZOLIN SODIUM-DEXTROSE 2-4 GM/100ML-% IV SOLN
INTRAVENOUS | Status: AC
  Filled 2024-06-21: qty 100

## 2024-06-21 MED ORDER — CEFAZOLIN SODIUM-DEXTROSE 2-4 GM/100ML-% IV SOLN
2.0000 g | INTRAVENOUS | Status: AC
  Administered 2024-06-21: 2 g via INTRAVENOUS

## 2024-06-21 MED ORDER — ROCURONIUM BROMIDE 10 MG/ML (PF) SYRINGE
PREFILLED_SYRINGE | INTRAVENOUS | Status: AC
Start: 2024-06-21 — End: 2024-06-21
  Filled 2024-06-21: qty 10

## 2024-06-21 MED ORDER — ALBUTEROL SULFATE (2.5 MG/3ML) 0.083% IN NEBU
3.0000 mL | INHALATION_SOLUTION | Freq: Four times a day (QID) | RESPIRATORY_TRACT | Status: DC | PRN
  Administered 2024-06-23 – 2024-06-24 (×2): 3 mL via RESPIRATORY_TRACT
  Filled 2024-06-21 (×2): qty 3

## 2024-06-21 MED ORDER — ENOXAPARIN SODIUM 40 MG/0.4ML IJ SOSY
40.0000 mg | PREFILLED_SYRINGE | Freq: Every day | INTRAMUSCULAR | Status: DC
  Administered 2024-06-22 – 2024-06-25 (×4): 40 mg via SUBCUTANEOUS
  Filled 2024-06-21 (×4): qty 0.4

## 2024-06-21 MED ORDER — SODIUM CHLORIDE (PF) 0.9 % IJ SOLN
INTRAMUSCULAR | Status: AC
  Filled 2024-06-21: qty 50

## 2024-06-21 MED ORDER — SENNOSIDES-DOCUSATE SODIUM 8.6-50 MG PO TABS
1.0000 | ORAL_TABLET | Freq: Every day | ORAL | Status: DC
  Administered 2024-06-21 – 2024-06-24 (×2): 1 via ORAL
  Filled 2024-06-21 (×2): qty 1

## 2024-06-21 MED ORDER — UMECLIDINIUM-VILANTEROL 62.5-25 MCG/ACT IN AEPB
1.0000 | INHALATION_SPRAY | Freq: Every day | RESPIRATORY_TRACT | Status: DC
  Administered 2024-06-22 – 2024-06-24 (×3): 1 via RESPIRATORY_TRACT
  Filled 2024-06-21 (×2): qty 14

## 2024-06-21 MED ORDER — CHLORHEXIDINE GLUCONATE 0.12 % MT SOLN
OROMUCOSAL | Status: AC
  Administered 2024-06-21: 15 mL via OROMUCOSAL
  Filled 2024-06-21: qty 15

## 2024-06-21 MED ORDER — TRAMADOL HCL 50 MG PO TABS
50.0000 mg | ORAL_TABLET | Freq: Four times a day (QID) | ORAL | Status: DC | PRN
  Administered 2024-06-25: 50 mg via ORAL
  Filled 2024-06-21: qty 1

## 2024-06-21 MED ORDER — LIDOCAINE 2% (20 MG/ML) 5 ML SYRINGE
INTRAMUSCULAR | Status: DC | PRN
  Administered 2024-06-21: 100 mg via INTRAVENOUS

## 2024-06-21 MED ORDER — CLEVIDIPINE BUTYRATE 0.5 MG/ML IV EMUL
INTRAVENOUS | Status: DC | PRN
  Administered 2024-06-21: 4 mg/h via INTRAVENOUS

## 2024-06-21 MED ORDER — ONDANSETRON HCL 4 MG/2ML IJ SOLN
INTRAMUSCULAR | Status: AC
  Filled 2024-06-21: qty 2

## 2024-06-21 MED ORDER — PROPOFOL 10 MG/ML IV BOLUS
INTRAVENOUS | Status: AC
  Filled 2024-06-21: qty 20

## 2024-06-21 MED ORDER — SODIUM CHLORIDE FLUSH 0.9 % IV SOLN
INTRAVENOUS | Status: DC | PRN
  Administered 2024-06-21: 100 mL

## 2024-06-21 MED ORDER — ALBUMIN HUMAN 5 % IV SOLN: INTRAVENOUS | Status: DC | PRN

## 2024-06-21 MED ORDER — ASPIRIN 81 MG PO TBEC
81.0000 mg | DELAYED_RELEASE_TABLET | Freq: Every day | ORAL | Status: DC
  Administered 2024-06-22 – 2024-06-25 (×4): 81 mg via ORAL
  Filled 2024-06-21 (×4): qty 1

## 2024-06-21 MED ORDER — SODIUM CHLORIDE 0.9 % IV SOLN: INTRAVENOUS | Status: DC

## 2024-06-21 MED ORDER — BISACODYL 5 MG PO TBEC
10.0000 mg | DELAYED_RELEASE_TABLET | Freq: Every day | ORAL | Status: DC
  Administered 2024-06-21 – 2024-06-25 (×4): 10 mg via ORAL
  Filled 2024-06-21 (×6): qty 2

## 2024-06-21 MED ORDER — PHENYLEPHRINE HCL (PRESSORS) 10 MG/ML IV SOLN
INTRAVENOUS | Status: DC | PRN
  Administered 2024-06-21 (×2): 120 ug via INTRAVENOUS

## 2024-06-21 MED ORDER — DEXAMETHASONE SODIUM PHOSPHATE 10 MG/ML IJ SOLN
INTRAMUSCULAR | Status: DC | PRN
  Administered 2024-06-21: 10 mg via INTRAVENOUS

## 2024-06-21 MED ORDER — DEXAMETHASONE SODIUM PHOSPHATE 10 MG/ML IJ SOLN
INTRAMUSCULAR | Status: AC
Start: 2024-06-21 — End: 2024-06-21
  Filled 2024-06-21: qty 1

## 2024-06-21 MED ORDER — ORAL CARE MOUTH RINSE: 15.0000 mL | Freq: Once | OROMUCOSAL | Status: AC

## 2024-06-21 MED ORDER — ORAL CARE MOUTH RINSE: 15.0000 mL | OROMUCOSAL | Status: DC | PRN

## 2024-06-21 MED ORDER — ALBUTEROL SULFATE HFA 108 (90 BASE) MCG/ACT IN AERS
INHALATION_SPRAY | RESPIRATORY_TRACT | Status: DC | PRN
  Administered 2024-06-21 (×2): 6 via RESPIRATORY_TRACT

## 2024-06-21 MED ORDER — BUPIVACAINE LIPOSOME 1.3 % IJ SUSP
INTRAMUSCULAR | Status: AC
  Filled 2024-06-21: qty 20

## 2024-06-21 MED ORDER — INSULIN ASPART 100 UNIT/ML IJ SOLN
0.0000 [IU] | Freq: Three times a day (TID) | INTRAMUSCULAR | Status: DC
  Administered 2024-06-21: 8 [IU] via SUBCUTANEOUS
  Administered 2024-06-22: 4 [IU] via SUBCUTANEOUS
  Administered 2024-06-22: 8 [IU] via SUBCUTANEOUS
  Administered 2024-06-22: 4 [IU] via SUBCUTANEOUS
  Administered 2024-06-22 – 2024-06-23 (×3): 8 [IU] via SUBCUTANEOUS
  Administered 2024-06-23: 4 [IU] via SUBCUTANEOUS
  Administered 2024-06-23 – 2024-06-24 (×3): 8 [IU] via SUBCUTANEOUS
  Administered 2024-06-24: 2 [IU] via SUBCUTANEOUS
  Administered 2024-06-24: 8 [IU] via SUBCUTANEOUS
  Administered 2024-06-25: 4 [IU] via SUBCUTANEOUS
  Administered 2024-06-25: 8 [IU] via SUBCUTANEOUS

## 2024-06-21 MED ORDER — AMLODIPINE BESYLATE 10 MG PO TABS
10.0000 mg | ORAL_TABLET | Freq: Every day | ORAL | Status: DC
  Administered 2024-06-22 – 2024-06-25 (×4): 10 mg via ORAL
  Filled 2024-06-21 (×4): qty 1

## 2024-06-21 MED ORDER — ROCURONIUM BROMIDE 10 MG/ML (PF) SYRINGE
PREFILLED_SYRINGE | INTRAVENOUS | Status: DC | PRN
  Administered 2024-06-21: 20 mg via INTRAVENOUS
  Administered 2024-06-21: 60 mg via INTRAVENOUS

## 2024-06-21 MED ORDER — MORPHINE SULFATE (PF) 2 MG/ML IV SOLN
2.0000 mg | INTRAVENOUS | Status: DC | PRN
  Administered 2024-06-21 (×2): 2 mg via INTRAVENOUS
  Filled 2024-06-21 (×2): qty 1

## 2024-06-21 MED ORDER — LIDOCAINE 2% (20 MG/ML) 5 ML SYRINGE
INTRAMUSCULAR | Status: AC
Start: 2024-06-21 — End: 2024-06-21
  Filled 2024-06-21: qty 5

## 2024-06-21 MED ORDER — CHLORHEXIDINE GLUCONATE 0.12 % MT SOLN: 15.0000 mL | Freq: Once | OROMUCOSAL | Status: AC

## 2024-06-21 MED ORDER — FENTANYL CITRATE (PF) 250 MCG/5ML IJ SOLN
INTRAMUSCULAR | Status: AC
  Filled 2024-06-21: qty 5

## 2024-06-21 MED ORDER — FENTANYL CITRATE (PF) 100 MCG/2ML IJ SOLN
25.0000 ug | INTRAMUSCULAR | Status: DC | PRN
  Administered 2024-06-21: 50 ug via INTRAVENOUS
  Administered 2024-06-21 (×2): 25 ug via INTRAVENOUS

## 2024-06-21 MED ORDER — OXYCODONE HCL 5 MG PO TABS
5.0000 mg | ORAL_TABLET | ORAL | Status: DC | PRN
  Administered 2024-06-22 – 2024-06-23 (×4): 10 mg via ORAL
  Administered 2024-06-23 (×2): 5 mg via ORAL
  Administered 2024-06-24 (×2): 10 mg via ORAL
  Filled 2024-06-21 (×3): qty 2
  Filled 2024-06-21: qty 1
  Filled 2024-06-21 (×4): qty 2

## 2024-06-21 MED ORDER — ACETAMINOPHEN 500 MG PO TABS
1000.0000 mg | ORAL_TABLET | Freq: Four times a day (QID) | ORAL | Status: DC
  Administered 2024-06-21 – 2024-06-25 (×14): 1000 mg via ORAL
  Filled 2024-06-21 (×15): qty 2

## 2024-06-21 MED ORDER — ACETAMINOPHEN 160 MG/5ML PO SOLN: 1000.0000 mg | Freq: Four times a day (QID) | ORAL | Status: DC

## 2024-06-21 MED ORDER — PANTOPRAZOLE SODIUM 40 MG PO TBEC
40.0000 mg | DELAYED_RELEASE_TABLET | Freq: Every day | ORAL | Status: DC
  Administered 2024-06-22 – 2024-06-25 (×4): 40 mg via ORAL
  Filled 2024-06-21 (×4): qty 1

## 2024-06-21 MED ORDER — ATORVASTATIN CALCIUM 40 MG PO TABS
40.0000 mg | ORAL_TABLET | Freq: Every day | ORAL | Status: DC
  Administered 2024-06-22 – 2024-06-25 (×4): 40 mg via ORAL
  Filled 2024-06-21 (×4): qty 1

## 2024-06-21 MED ORDER — FENTANYL CITRATE (PF) 250 MCG/5ML IJ SOLN
INTRAMUSCULAR | Status: DC | PRN
  Administered 2024-06-21: 100 ug via INTRAVENOUS
  Administered 2024-06-21: 50 ug via INTRAVENOUS
  Administered 2024-06-21: 100 ug via INTRAVENOUS

## 2024-06-21 MED ORDER — KETOROLAC TROMETHAMINE 15 MG/ML IJ SOLN
15.0000 mg | Freq: Four times a day (QID) | INTRAMUSCULAR | Status: AC
  Administered 2024-06-21 – 2024-06-23 (×8): 15 mg via INTRAVENOUS
  Filled 2024-06-21 (×8): qty 1

## 2024-06-21 MED ORDER — 0.9 % SODIUM CHLORIDE (POUR BTL) OPTIME
TOPICAL | Status: DC | PRN
  Administered 2024-06-21: 1000 mL

## 2024-06-21 MED ORDER — CLEVIDIPINE BUTYRATE 0.5 MG/ML IV EMUL
INTRAVENOUS | Status: AC
  Filled 2024-06-21: qty 50

## 2024-06-21 MED ORDER — ONDANSETRON HCL 4 MG/2ML IJ SOLN
INTRAMUSCULAR | Status: DC | PRN
  Administered 2024-06-21: 4 mg via INTRAVENOUS

## 2024-06-21 MED ORDER — LACTATED RINGERS IV SOLN: INTRAVENOUS | Status: DC | PRN

## 2024-06-21 MED ORDER — METHOCARBAMOL 500 MG PO TABS: 500.0000 mg | ORAL_TABLET | Freq: Three times a day (TID) | ORAL | Status: DC | PRN

## 2024-06-21 MED ORDER — SUGAMMADEX SODIUM 200 MG/2ML IV SOLN
INTRAVENOUS | Status: DC | PRN
  Administered 2024-06-21: 200 mg via INTRAVENOUS

## 2024-06-21 SURGICAL SUPPLY — 75 items
BLADE CLIPPER SURG (BLADE) ×1 IMPLANT
BLADE SURG 11 STRL SS (BLADE) ×1 IMPLANT
CANISTER SUCTION 3000ML PPV (SUCTIONS) ×2 IMPLANT
CANNULA REDUCER 12-8 DVNC XI (CANNULA) ×2 IMPLANT
CATH THORACIC 28FR (CATHETERS) ×1 IMPLANT
CHLORAPREP W/TINT 26 (MISCELLANEOUS) ×1 IMPLANT
CLIP TI MEDIUM 6 (CLIP) IMPLANT
CNTNR URN SCR LID CUP LEK RST (MISCELLANEOUS) ×5 IMPLANT
CONN ST 1/4X3/8 BEN (MISCELLANEOUS) IMPLANT
DEFOGGER SCOPE WARM SEASHARP (MISCELLANEOUS) ×1 IMPLANT
DERMABOND ADVANCED .7 DNX12 (GAUZE/BANDAGES/DRESSINGS) ×1 IMPLANT
DRAIN CHANNEL 28F RND 3/8 FF (WOUND CARE) IMPLANT
DRAPE ARM DVNC X/XI (DISPOSABLE) ×4 IMPLANT
DRAPE COLUMN DVNC XI (DISPOSABLE) ×1 IMPLANT
DRAPE CV SPLIT W-CLR ANES SCRN (DRAPES) ×1 IMPLANT
DRAPE SURG ORHT 6 SPLT 77X108 (DRAPES) ×1 IMPLANT
ELECT BLADE 6.5 EXT (BLADE) IMPLANT
ELECTRODE REM PT RTRN 9FT ADLT (ELECTROSURGICAL) ×1 IMPLANT
FORCEPS BPLR LNG DVNC XI (INSTRUMENTS) IMPLANT
FORCEPS CADIERE DVNC XI (FORCEP) IMPLANT
GAUZE KITTNER 4X5 RF (MISCELLANEOUS) IMPLANT
GAUZE KITTNER 4X8 (MISCELLANEOUS) ×1 IMPLANT
GAUZE SPONGE 4X4 12PLY STRL (GAUZE/BANDAGES/DRESSINGS) ×1 IMPLANT
GLOVE BIO SURGEON STRL SZ7.5 (GLOVE) ×2 IMPLANT
GLOVE SURG POLYISO LF SZ8 (GLOVE) ×1 IMPLANT
GOWN STRL REUS W/ TWL LRG LVL3 (GOWN DISPOSABLE) ×2 IMPLANT
GOWN STRL REUS W/ TWL XL LVL3 (GOWN DISPOSABLE) ×2 IMPLANT
GOWN STRL REUS W/TWL 2XL LVL3 (GOWN DISPOSABLE) ×1 IMPLANT
GOWN STRL SURGICAL XL XLNG (GOWN DISPOSABLE) ×3 IMPLANT
GRASPER TIP-UP FEN DVNC XI (INSTRUMENTS) IMPLANT
HEMOSTAT SURGICEL 2X14 (HEMOSTASIS) ×1 IMPLANT
IRRIGATION STRYKERFLOW (MISCELLANEOUS) IMPLANT
KIT BASIN OR (CUSTOM PROCEDURE TRAY) ×1 IMPLANT
KIT TURNOVER KIT B (KITS) ×1 IMPLANT
NDL 22X1.5 STRL (OR ONLY) (MISCELLANEOUS) ×1 IMPLANT
NEEDLE 22X1.5 STRL (OR ONLY) (MISCELLANEOUS) ×1 IMPLANT
NS IRRIG 1000ML POUR BTL (IV SOLUTION) ×3 IMPLANT
PACK CHEST (CUSTOM PROCEDURE TRAY) ×1 IMPLANT
PAD ARMBOARD POSITIONER FOAM (MISCELLANEOUS) ×5 IMPLANT
RELOAD STAPLE 45 2.5 WHT DVNC (STAPLE) IMPLANT
RELOAD STAPLE 45 3.5 BLU DVNC (STAPLE) IMPLANT
RELOAD STAPLE 45 4.3 GRN DVNC (STAPLE) IMPLANT
SCISSORS LAP 5X35 DISP (ENDOMECHANICALS) IMPLANT
SEAL UNIV 5-12 XI (MISCELLANEOUS) ×4 IMPLANT
SEALANT PROGEL (MISCELLANEOUS) IMPLANT
SEALER LIGASURE MARYLAND 30 (ELECTROSURGICAL) IMPLANT
SET TRI-LUMEN FLTR TB AIRSEAL (TUBING) ×1 IMPLANT
SOLUTION ELECTROSURG ANTI STCK (MISCELLANEOUS) IMPLANT
SPONGE INTESTINAL PEANUT (DISPOSABLE) IMPLANT
SPONGE TONSIL 1 RF SGL (DISPOSABLE) IMPLANT
STAPLER 45 SUREFORM CVD DVNC (STAPLE) IMPLANT
STOPCOCK 4 WAY LG BORE MALE ST (IV SETS) ×1 IMPLANT
SUT MNCRL AB 3-0 PS2 18 (SUTURE) IMPLANT
SUT MON AB 2-0 CT1 36 (SUTURE) IMPLANT
SUT PDS AB 1 CTX 36 (SUTURE) IMPLANT
SUT PROLENE 4-0 RB1 .5 CRCL 36 (SUTURE) IMPLANT
SUT SILK 1 MH (SUTURE) ×1 IMPLANT
SUT SILK 1 TIES 10X30 (SUTURE) IMPLANT
SUT SILK 2 0 SH (SUTURE) IMPLANT
SUT SILK 2 0SH CR/8 30 (SUTURE) IMPLANT
SUT VIC AB 1 CTX36XBRD ANBCTR (SUTURE) IMPLANT
SUT VIC AB 2-0 CT1 TAPERPNT 27 (SUTURE) ×1 IMPLANT
SUT VIC AB 3-0 SH 27X BRD (SUTURE) ×2 IMPLANT
SUT VICRYL 0 TIES 12 18 (SUTURE) ×1 IMPLANT
SUT VICRYL 0 UR6 27IN ABS (SUTURE) ×2 IMPLANT
SYR 10ML LL (SYRINGE) ×1 IMPLANT
SYR 20ML LL LF (SYRINGE) ×1 IMPLANT
SYR 50ML LL SCALE MARK (SYRINGE) ×1 IMPLANT
SYSTEM RETRIEVAL ANCHOR 15 (MISCELLANEOUS) IMPLANT
SYSTEM SAHARA CHEST DRAIN ATS (WOUND CARE) ×1 IMPLANT
TAPE CLOTH 4X10 WHT NS (GAUZE/BANDAGES/DRESSINGS) ×1 IMPLANT
TAPE CLOTH SURG 4X10 WHT LF (GAUZE/BANDAGES/DRESSINGS) IMPLANT
TOWEL GREEN STERILE (TOWEL DISPOSABLE) ×1 IMPLANT
TRAY FOLEY MTR SLVR 16FR STAT (SET/KITS/TRAYS/PACK) ×1 IMPLANT
TUBING EXTENTION W/L.L. (IV SETS) ×1 IMPLANT

## 2024-06-21 NOTE — Telephone Encounter (Signed)
 Copied from CRM #[id number]. Topic: General - Other >> Jun 21, 2024 11:45 AM Pinkey ORN wrote: Reason for CRM: Faxed Order >> Jun 21, 2024 11:52 AM Pinkey ORN wrote: Peggy Warren [facility] Delivery  Called on behalf of patient, wanting to know if [facility] received the fax for incontinent supplies for patient. States the fax was sent on both Sept 9th & 10th.

## 2024-06-21 NOTE — Transfer of Care (Signed)
 Immediate Anesthesia Transfer of Care Note  Patient: Peggy Warren  Procedure(s) Performed: LOBECTOMY, LUNG, ROBOT-ASSISTED, USING VATS (Right: Chest) BIOPSY, LYMPH NODE (Right: Chest) BLOCK, NERVE, INTERCOSTAL (Right: Chest)  Patient Location: PACU  Anesthesia Type:General  Level of Consciousness: awake and alert   Airway & Oxygen Therapy: Patient Spontanous Breathing and Patient connected to face mask oxygen  Post-op Assessment: Report given to RN and Post -op Vital signs reviewed and stable  Post vital signs: Reviewed and stable  Last Vitals:  Vitals Value Taken Time  BP 175/118 06/21/24 16:45  Temp    Pulse 67 06/21/24 16:47  Resp 25 06/21/24 16:47  SpO2 97 % 06/21/24 16:47  Vitals shown include unfiled device data.  Last Pain:  Vitals:   06/21/24 1112  TempSrc:   PainSc: 0-No pain         Complications: There were no known notable events for this encounter.

## 2024-06-21 NOTE — Anesthesia Procedure Notes (Signed)
 Arterial Line Insertion Start/End9/08/2024 1:00 PM, 06/21/2024 1:10 PM Performed by: Erma Thom SAUNDERS, MD, CRNA  Patient location: Pre-op. Preanesthetic checklist: patient identified, IV checked, site marked, risks and benefits discussed, surgical consent, monitors and equipment checked, pre-op evaluation, timeout performed and anesthesia consent Lidocaine  1% used for infiltration radial was placed Catheter size: 20 G Hand hygiene performed  and maximum sterile barriers used   Attempts: 1 Procedure performed without using ultrasound guided technique. Following insertion, dressing applied. Post procedure assessment: normal and unchanged  Additional procedure comments: Atraumatic, SRNA placed A line .

## 2024-06-21 NOTE — Interval H&P Note (Signed)
 History and Physical Interval Note:  06/21/2024 12:38 PM  Peggy Warren  has presented today for surgery, with the diagnosis of LUNG CANCER.  The various methods of treatment have been discussed with the patient and family. After consideration of risks, benefits and other options for treatment, the patient has consented to  Procedure(s) with comments: LOBECTOMY, LUNG, ROBOT-ASSISTED, USING VATS (Right) - ROBOTIC RIGHT UPPER LOBECTOMY as a surgical intervention.  The patient's history has been reviewed, patient examined, no change in status, stable for surgery.  I have reviewed the patient's chart and labs.  Questions were answered to the patient's satisfaction.     Ivelis Norgard MALVA Rayas

## 2024-06-21 NOTE — Brief Op Note (Incomplete)
 06/21/2024  4:38 PM  PATIENT:  Peggy Warren  58 y.o. female  PRE-OPERATIVE DIAGNOSIS:  LUNG CANCER  POST-OPERATIVE DIAGNOSIS:  LUNG CANCER  PROCEDURE:  Procedure(s) with comments: LOBECTOMY, LUNG, ROBOT-ASSISTED, USING VATS (Right) - ROBOTIC RIGHT UPPER LOBECTOMY BIOPSY, LYMPH NODE (Right) BLOCK, NERVE, INTERCOSTAL (Right)  SURGEON:  Surgeons and Role:    * Lightfoot, Linnie KIDD, MD - Primary  PHYSICIAN ASSISTANT:   ASSISTANTS: {ASSISTANTS:31801}   ANESTHESIA:   {procedures; anesthesia:812}  EBL:  100 mL   BLOOD ADMINISTERED:{BLOOD GIVEN TYPES AND AMOUNTS:20467}  DRAINS: {Devices; drains:31758}   LOCAL MEDICATIONS USED:  {LOCAL MEDICATIONS:10721995}  SPECIMEN:  {ONC STAGING; AJCC TYPE OF SPECIMEN:115600001}  DISPOSITION OF SPECIMEN:  {SPECIMEN DISPOSITION:204680}  COUNTS:  {OR COUNTS CORRECT/INCORRECT:204690}  TOURNIQUET:  * No tourniquets in log *  DICTATION: .{DICTATION TYPES:[phone]}  PLAN OF CARE: {OPTIME PLAN OF RJMZ:8924449987}  PATIENT DISPOSITION:  {op note disposition:31782}   Delay start of Pharmacological VTE agent (>24hrs) due to surgical blood loss or risk of bleeding: {YES/NO/NOT APPLICABLE:20182}

## 2024-06-21 NOTE — Hospital Course (Addendum)
 Referring: Isadora Hose, MD Primary Care: [name], MD Primary Cardiologist: None   History of Present Illness:    ANUSHRI CASALINO is a 58 y.o. female who presents for surgical evaluation of a 2.7cm right upper lobe biopsy proven NSCLC.  She has a long smoking history, and continues to smoke currently.  This was found incidentally on imaging.  She denies any chest pain, or shortness of breath.  She does have sciatica, and is limited in walking long distances. 58 y.o. 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  Biopsy of the nodes were negative.  Her PET/CT was done after the lymph node biopsy, so the activity seen is likely inflammatory.  She will require a right upper lobectomy.  The risks and benefits of right RATS, RULectomy were discussed in detail.  The patient is agreeable to proceed.  She will need to be off cigarettes for 2 weeks prior to surgery.  She stated that today will be her last day.    Hospital Course: Ms. Huneke was admitted for elective surgery on  06/21/24 and taken to the OR where robotic-assisted right upper lobectomy was performed along with mediastinal lymph node sampling and intercostal nerve block.  Following the procedure, she was extubated and transferred to the post-anesthesia recovery unit then on to [facility].   Postoperative hospital course:  The patient has remained hemodynamically stable.  On postop day 1 her blood pressure was elevated and Norvasc  was resumed.  He has maintained sinus rhythm with some sinus bradycardia.  Oxygen has been weaned in a routine manner.  She has been voiding well with good urine output.  Renal function has remained within normal limits.  She does have an expected acute blood loss anemia which is mild and being monitored clinically.

## 2024-06-21 NOTE — Anesthesia Procedure Notes (Signed)
 Procedure Name: Intubation Date/Time: 06/21/2024 2:23 PM  Performed by: Delores Dus, CRNAPre-anesthesia Checklist: Patient identified, Emergency Drugs available, Suction available and Patient being monitored Patient Re-evaluated:Patient Re-evaluated prior to induction Oxygen Delivery Method: Circle system utilized Preoxygenation: Pre-oxygenation with 100% oxygen Induction Type: IV induction Ventilation: Mask ventilation without difficulty Laryngoscope Size: Glidescope, 3, Mac and 4 Grade View: Grade II Tube type: Oral Endobronchial tube: Double lumen EBT Tube size: 7.5 mm Number of attempts: 3 Airway Equipment and Method: Stylet and Oral airway Placement Confirmation: ETT inserted through vocal cords under direct vision, positive ETCO2 and breath sounds checked- equal and bilateral Tube secured with: Tape Dental Injury: Teeth and Oropharynx as per pre-operative assessment  Comments: Unable to pass DLT-anterior view, small mouth opening. Tissue friable. Intubated with 7.5 ET tube, and tube exchanger passed through bronchial port and DLT passed over-35 DLT. Fiberoptic used to verify placement. Copious secretions.

## 2024-06-21 NOTE — Brief Op Note (Signed)
 06/21/2024  4:19 PM  PATIENT:  Peggy Warren  58 y.o. female  PRE-OPERATIVE DIAGNOSIS:  LUNG CANCER  POST-OPERATIVE DIAGNOSIS:  LUNG CANCER  PROCEDURE:   LOBECTOMY, LUNG, ROBOT-ASSISTED, USING VATS (Right) - ROBOTIC RIGHT UPPER LOBECTOMY BIOPSY, LYMPH NODE  INTERCOSTAL NERVE BLOCK  SURGEON:  Lightfoot, Linnie KIDD, MD   PHYSICIAN ASSISTANT: Makynna Manocchio  ASSISTANTS: Williams, Sadie, Scrub Person    ANESTHESIA:   general  EBL:  50ml  BLOOD ADMINISTERED:none  DRAINS: Right pleural drain x 1   LOCAL MEDICATIONS USED:  Exparel   SPECIMEN:  Right upper lung lobe, lymph nodes  DISPOSITION OF SPECIMEN:  PATHOLOGY  COUNTS:  Correct  DICTATION: .Dragon Dictation  PLAN OF CARE: Admit to inpatient   PATIENT DISPOSITION:  PACU - hemodynamically stable.   Delay start of Pharmacological VTE agent (>24hrs) due to surgical blood loss or risk of bleeding: yes

## 2024-06-22 ENCOUNTER — Encounter (HOSPITAL_COMMUNITY): Payer: Self-pay | Admitting: Thoracic Surgery (Cardiothoracic Vascular Surgery)

## 2024-06-22 ENCOUNTER — Inpatient Hospital Stay (HOSPITAL_COMMUNITY)

## 2024-06-22 DIAGNOSIS — Z902 Acquired absence of lung [part of]: Secondary | ICD-10-CM

## 2024-06-22 LAB — CBC
HCT: 37.3 % (ref 36.0–46.0)
Hemoglobin: 12.6 g/dL (ref 12.0–15.0)
MCH: 33.3 pg (ref 26.0–34.0)
MCHC: 33.8 g/dL (ref 30.0–36.0)
MCV: 98.7 fL (ref 80.0–100.0)
Platelets: 284 K/uL (ref 150–400)
RBC: 3.78 MIL/uL — ABNORMAL LOW (ref 3.87–5.11)
RDW: 12.2 % (ref 11.5–15.5)
WBC: 12.8 K/uL — ABNORMAL HIGH (ref 4.0–10.5)
nRBC: 0 % (ref 0.0–0.2)

## 2024-06-22 LAB — BASIC METABOLIC PANEL WITH GFR
Anion gap: 12 (ref 5–15)
BUN: 6 mg/dL (ref 6–20)
CO2: 19 mmol/L — ABNORMAL LOW (ref 22–32)
Calcium: 8.5 mg/dL — ABNORMAL LOW (ref 8.9–10.3)
Chloride: 102 mmol/L (ref 98–111)
Creatinine, Ser: 0.68 mg/dL (ref 0.44–1.00)
GFR, Estimated: >60 mL/min (ref >60)
Glucose, Bld: 191 mg/dL — ABNORMAL HIGH (ref 70–99)
Potassium: 4.1 mmol/L (ref 3.5–5.1)
Sodium: 133 mmol/L — ABNORMAL LOW (ref 135–145)

## 2024-06-22 LAB — GLUCOSE, CAPILLARY
Glucose-Capillary: 201 mg/dL — ABNORMAL HIGH (ref 70–99)
Glucose-Capillary: 198 mg/dL — ABNORMAL HIGH (ref 70–99)
Glucose-Capillary: 176 mg/dL — ABNORMAL HIGH (ref 70–99)
Glucose-Capillary: 243 mg/dL — ABNORMAL HIGH (ref 70–99)

## 2024-06-22 NOTE — Op Note (Signed)
      [address]       [place] [address]             [id number]        06/22/2024  Patient:  Peggy Warren Pre-Op Dx: Right upper lobe NSCLC   Post-op Dx:  same Procedure: - Robotic assisted right video thoracoscopy - Right upper lobectomy - Mediastinal lymph node sampling - Intercostal nerve block  Surgeon and Role:      * Sani Madariaga, Linnie KIDD, MD - Primary  Assistant: MICAEL Cera, PA-C  An experienced assistant was required given the complexity of this surgery and the standard of surgical care. The assistant was needed for exposure, dissection, suctioning, retraction of delicate tissues and sutures, instrument exchange and for overall help during this procedure.    Anesthesia  general EBL:  100 ml Blood Administration: none Specimen:  right upper lobe, hilar and mediastinal nodes  Drains: 28 F argyle chest tube in right chest Counts: correct   Indications: 58 y.o. 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  Biopsy of the nodes were negative.  Her PET/CT was done after the lymph node biopsy, so the activity seen is likely inflammatory.  She will require a right upper lobectomy.  The risks and benefits of right RATS, RULectomy were discussed in detail.  The patient is agreeable to proceed.  She will need to be off cigarettes for 2 weeks prior to surgery.  She stated that today will be her last day.    Findings: Normal anatomy  Operative Technique: After the risks, benefits and alternatives were thoroughly discussed, the patient was brought to the operative theatre.  Anesthesia was induced, and the patient was then placed in a lateral decubitus position and was prepped and draped in normal sterile fashion.  An appropriate surgical pause was performed, and pre-operative antibiotics were dosed accordingly.  We began by placing our 4 robotic ports in the the 7th intercostal space targeting the hilum of the  lung.  A 12mm assistant port was placed in the 9th intercostal space in the anterior axillary line.  The robot was then docked and all instruments were passed under direct visualization.    The lung was then retracted superiorly, and the inferior pulmonary ligament was divided.  The hilum was mobilized anteriorly and posteriorly.  We identified the upper lobe pulmonary vein, and after careful isolation, it was divided with a vascular stapler.  We next moved to the pulmonary artery.  The artery was then divided with a vascular load stapler.  The bronchus to the upper lobe was then isolated.  After a test clamp, with good ventilation of the remaining lung, the bronchus was then divided.  The fissure was completed, and the specimen was passed into an endocatch bag.  It was removed from the anterior access site.    Lymph nodes were then sampled at hilum and mediastinum.  The chest was irrigated, and an air leak test was performed.  An intercostal nerve block was performed under direct visualization.  A 28 F chest tube was then placed, and we watch the remaining lobes re-expand.  The skin and soft tissue were closed with absorbable suture    The patient tolerated the procedure without any immediate complications, and was transferred to the PACU in stable condition.  Jayonna Meyering KIDD Rayas

## 2024-06-22 NOTE — TOC CM/SW Note (Signed)
 Transition of Care [facility]) - Inpatient Brief Assessment   Patient Details  Name: Peggy Warren MRN: [id number] Date of Birth: 09/28/1966  [facility]) CM/SW Contact:    Lauraine FORBES Saa, LCSWA Phone Number: 06/22/2024, 9:12 AM   Clinical Narrative:  9:12 AM Per chart review, patient resides at home with significant other. Patient has a PCP and insurance. Patient does not have SNF or HH history. Patient has DME at home as of October 2024. Patient's preferred pharmacy's are [facility] and [facility] [address]. No TOC needs identified at this time. TOC will continue to follow and be available to assist.  Transition of Care Asessment: Insurance and Status: Insurance coverage has been reviewed Patient has primary care physician: Yes Home environment has been reviewed: Private Residence Prior level of function:: N/A Prior/Current Home Services: No current home services Social Drivers of Health Review: SDOH reviewed no interventions necessary Readmission risk has been reviewed: Yes (Currently Green 10%) Transition of care needs: no transition of care needs at this time

## 2024-06-22 NOTE — Progress Notes (Signed)
 Patient states that she will ambulate in the halls after lunch.

## 2024-06-22 NOTE — Plan of Care (Signed)

## 2024-06-22 NOTE — Progress Notes (Signed)
 Mobility Specialist Progress Note:   06/22/24 1530  Mobility  Activity Ambulated with assistance  Level of Assistance Standby assist, set-up cues, supervision of patient - no hands on  Assistive Device Front wheel walker  Distance Ambulated (ft) 60 ft  Activity Response Tolerated well  Mobility Referral Yes  Mobility visit 1 Mobility  Mobility Specialist Start Time (ACUTE ONLY) 1530  Mobility Specialist Stop Time (ACUTE ONLY) 1542  Mobility Specialist Time Calculation (min) (ACUTE ONLY) 12 min   Pt agreeable to mobility session. Required no physical assistance throughout ambulation. Pt fatigued quickly, requiring multiple short standing rest breaks. Up to 3LO2 needed to maintain SpO2 >88%. C/o discomfort from chest tube. Pt left sitting EOB with all needs met, encouraged frequent ambulation.   Therisa Rana Mobility Specialist Please contact via SecureChat or  Rehab office at [phone]

## 2024-06-22 NOTE — Anesthesia Postprocedure Evaluation (Signed)
 Anesthesia Post Note  Patient: [name]  Procedure(s) Performed: LOBECTOMY, LUNG, ROBOT-ASSISTED, USING VATS (Right: Chest) BIOPSY, LYMPH NODE (Right: Chest) BLOCK, NERVE, INTERCOSTAL (Right: Chest)     Patient location during evaluation: PACU Anesthesia Type: General Level of consciousness: awake and alert Pain management: pain level controlled Vital Signs Assessment: post-procedure vital signs reviewed and stable Respiratory status: spontaneous breathing, nonlabored ventilation, respiratory function stable and patient connected to nasal cannula oxygen Cardiovascular status: blood pressure returned to baseline and stable Postop Assessment: no apparent nausea or vomiting Anesthetic complications: no   There were no known notable events for this encounter.  Last Vitals:  Vitals:   06/22/24 1100 06/22/24 1551  BP: (!) 152/86 (!) 161/66  Pulse: 64 (!) 56  Resp: 19 17  Temp: 36.5 C 36.6 C  SpO2: 98% 96%    Last Pain:  Vitals:   06/22/24 1822  TempSrc:   PainSc: 4                  Thom JONELLE Peoples

## 2024-06-22 NOTE — Progress Notes (Addendum)
 1 Day Post-Op Procedure(s) (LRB): LOBECTOMY, LUNG, ROBOT-ASSISTED, USING VATS (Right) BIOPSY, LYMPH NODE (Right) BLOCK, NERVE, INTERCOSTAL (Right) Subjective: Some soreness  Objective: Vital signs in last 24 hours: Temp:  [97.6 F (36.4 C)-98.3 F (36.8 C)] 97.7 F (36.5 C) (09/12 0311) Pulse Rate:  [57-87] 57 (09/12 0311) Cardiac Rhythm: Normal sinus rhythm (09/11 1812) Resp:  [17-24] 18 (09/12 0311) BP: (154-196)/(76-96) 170/87 (09/12 0311) SpO2:  [91 %-100 %] 97 % (09/12 0311) Arterial Line BP: (162-183)/(72-82) 183/82 (09/11 1730) Weight:  [85.7 kg-86.6 kg] 85.7 kg (09/11 1812)  Hemodynamic parameters for last 24 hours:    Intake/Output from previous day: 09/11 0701 - 09/12 0700 In: 1425.8 [I.V.:1000; IV Piggyback:425.8] Out: 390 [Urine:150; Blood:100; Chest Tube:140] Intake/Output this shift: No intake/output data recorded.  General appearance: alert, cooperative, and no distress Heart: regular rate and rhythm Lungs: clear to auscultation bilaterally Abdomen: benign Extremities: no edema or calf tenderness Wound: incis ok  Lab Results: Recent Labs    06/20/24 1100 06/21/24 1449 06/21/24 1528  WBC 8.8  --   --   HGB 13.3 13.3 12.2  HCT 39.6 39.0 36.0  PLT 304  --   --    BMET:  Recent Labs    06/20/24 1100 06/21/24 1449 06/21/24 1528  NA 138 144 142  K 3.3* 2.9* 3.4*  CL 101  --   --   CO2 25  --   --   GLUCOSE 126*  --   --   BUN <5*  --   --   CREATININE 0.57  --   --   CALCIUM  9.1  --   --     PT/INR:  Recent Labs    06/20/24 1100  LABPROT 14.2  INR 1.0   ABG    Component Value Date/Time   PHART 7.247 (L) 06/21/2024 1528   HCO3 27.5 06/21/2024 1528   TCO2 30 06/21/2024 1528   ACIDBASEDEF 1.0 06/21/2024 1528   O2SAT 98 06/21/2024 1528   CBG (last 3)  Recent Labs    06/21/24 1525 06/21/24 1659 06/21/24 2106  GLUCAP 162* 182* 222*    Meds Scheduled Meds:  acetaminophen   1,000 mg Oral Q6H   Or   acetaminophen  (TYLENOL )  oral liquid 160 mg/5 mL  1,000 mg Oral Q6H   amLODipine   10 mg Oral Daily   aspirin  EC  81 mg Oral Daily   atorvastatin   40 mg Oral Daily   bisacodyl   10 mg Oral Daily   enoxaparin  (LOVENOX ) injection  40 mg Subcutaneous Daily   insulin  aspart  0-24 Units Subcutaneous TID AC & HS   ketorolac   15 mg Intravenous Q6H   pantoprazole   40 mg Oral Daily   senna-docusate  1 tablet Oral QHS   umeclidinium-vilanterol  1 puff Inhalation Daily   Continuous Infusions: PRN Meds:.albuterol , methocarbamol , morphine  injection, ondansetron  (ZOFRAN ) IV, mouth rinse, oxyCODONE , traMADol   Xrays DG Chest Port 1 View Result Date: 06/21/2024 CLINICAL DATA:  Status post lobectomy EXAM: PORTABLE CHEST 1 VIEW COMPARISON:  06/20/2024, PET CT 05/11/2024 FINDINGS: Interval postoperative changes of the right thorax consistent with right upper lobectomy. Placement of right-sided chest tube with tip near the apex. Relative hyperlucency at the right upper lung but without discernible pleural line. Right upper paramediastinal lobulated opacity. Mild interstitial densities throughout, could be secondary to minimal edema. The heart appears slightly enlarged. Small volume gas in the right lower chest wall soft tissues. IMPRESSION: 1. Interval postoperative changes of the right thorax consistent with  history of right upper lobectomy. Placement of right-sided chest tube with tip near the apex. Relative hyperlucency at the right upper lung but without discernible pleural line to confidently suggest pneumothorax. 2. Right upper paramediastinal lobulated opacity, possible atelectasis or postop change. Attention on follow-up imaging. 3. Cardiomegaly with mild interstitial densities throughout, could be secondary to minimal edema. Electronically Signed   By: Luke Bun M.D.   On: 06/21/2024 17:11   DG Chest 2 View Result Date: 06/21/2024 EXAM: 2 VIEW(S) XRAY OF THE CHEST 06/20/2024 10:56:00 AM COMPARISON: 05/10/2024. Comparison PET scan  1/25. CLINICAL HISTORY: Pre-op chest exam FINDINGS: LUNGS AND PLEURA: The right perihilar bronchogenic carcinoma is again seen. No other focal lesions are present. No focal airspace disease is present. Changes of COPD are present. No pleural effusion. No pneumothorax. HEART AND MEDIASTINUM: No acute abnormality of the cardiac and mediastinal silhouettes. BONES AND SOFT TISSUES: No acute osseous abnormality. IMPRESSION: 1. Right perihilar bronchogenic carcinoma, again seen. 2. Changes of COPD. Electronically signed by: Lonni Necessary MD 06/21/2024 09:10 AM EDT RP Workstation: [id number]   Myocardial Perfusion Imaging Result Date: 06/20/2024   The study is normal. Findings are consistent with no ischemia and no infarction. The study is low risk.   No ST deviation was noted.   LV perfusion is normal. There is no evidence of ischemia. There is no evidence of infarction.   Left ventricular function is normal. Nuclear stress EF: 75%. The left ventricular ejection fraction is hyperdynamic (>65%). End diastolic cavity size is normal. End systolic cavity size is normal.   CT images were obtained for attenuation correction and were examined for the presence of coronary calcium  when appropriate.   Coronary calcium  was absent on the attenuation correction CT images.  Aortic atherosclerosis.    Assessment/Plan: S/P Procedure(s) (LRB): LOBECTOMY, LUNG, ROBOT-ASSISTED, USING VATS (Right) BIOPSY, LYMPH NODE (Right) BLOCK, NERVE, INTERCOSTAL (Right) POD#1  1 afeb, s BP 150's-190's, will resume Norvasc , sinus rhythm/Brady 2 O2 sats good on 2 liters 3 voiding well- not all measured 4 CT 390 ml- keep in place  5 CXR - no pntx, clear lung fields 6  BS fair control- on SSI- she is supposed to start ozempic  as an outpatient soon 8 labs pending 9 lovenox  for DVT PPX 10 routine pulm hygiene and rehab     LOS: 1 day    Lemond FORBES Cera PA-C Pager [phone] 06/22/2024  No leak Ambulation  Camdyn Beske MALVA Rayas

## 2024-06-22 NOTE — Discharge Summary (Cosign Needed Addendum)
 Physician Discharge Summary                [address] 4[address]               [place], [place] [address]                      [phone]   Patient ID: Peggy Warren MRN: [id number] DOB/AGE: [birth year] 58 y.o.  Admit date: 06/21/2024 Discharge date: 06/25/2024  Admission Diagnoses: Non-small cell lung cancer COPD Tobacco abuse History of cocaine abuse Type 2 diabetes mellitus Hypertension Obesity    Discharge Diagnoses:   Non-small cell lung cancer S/P robot-assisted right upper lobectomy COPD Tobacco abuse History of cocaine abuse Type 2 diabetes mellitus Hypertension Obesity Hypoxia  Consults: None  Procedure (s):  06/21/2024 Robot-assisted right upper lobectomy with intercostal nerve block and mediastinal lymph node sampling  Referring: Isadora Hose, MD Primary Care: [name], MD Primary Cardiologist: None   History of Present Illness:    Peggy Warren is a 58 y.o. female who presents for surgical evaluation of a 2.7cm right upper lobe biopsy proven NSCLC.  She has a long smoking history, and continues to smoke currently.  This was found incidentally on imaging.  She denies any chest pain, or shortness of breath.  She does have sciatica, and is limited in walking long distances. 58 y.o. 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  Biopsy of the nodes were negative.  Her PET/CT was done after the lymph node biopsy, so the activity seen is likely inflammatory.  She will require a right upper lobectomy.  The risks and benefits of right RATS, RULectomy were discussed in detail.  The patient is agreeable to proceed.  She will need to be off cigarettes for 2 weeks prior to surgery.  She stated that today will be her last day.    Hospital Course: Peggy Warren was admitted for elective surgery on  06/21/24 and taken to the OR where robotic-assisted right upper lobectomy was performed along with  mediastinal lymph node sampling and intercostal nerve block.  Following the procedure, she was extubated and transferred to the post-anesthesia recovery unit then on to [facility].   Postoperative hospital course:  The patient has remained hemodynamically stable.  On postop day 1 her blood pressure was elevated and Norvasc  was resumed.  He has maintained sinus rhythm with some sinus bradycardia.  Oxygen has been weaned in a routine manner.  She has been voiding well with good urine output.  Renal function has remained within normal limits.  She does have an expected acute blood loss anemia which is mild and being monitored clinically.  Glucose was monitored and sliding scale insulin  was provided.  She had persistent hypoglycemia so Lantus  20 units subcu twice daily was added.  The airleak subsided quickly postoperatively allowing for chest tube removal on postop day 2.  Follow-up chest x-ray was satisfactory.  She continued to have some hypoxia requiring supplemental oxygen.  With emphasized working on pulmonary hygiene with incentive spirometry.  She was maintained on her usual respiratory medications and Mucinex  was also added to help clear secretions. By post-op day 4, she had been weaned to room air with satisfactory O2 sats at rest but she continued to have some hypoxia while ambulating. The CXR was stable with bibasilar atelectasis and tiny effusions.   We arranged for home O2 therapy and she  is encouraged to continue working on pulmonary hygiene (coughing, deep breathing, and incentive spirometry several times daily) at home as she has been doing in the hospital.   She was independent with mobility and transfers. The incisions were healing with no signs of complication.    Latest Vital Signs: Blood pressure 130/71, pulse 63, temperature 98 F (36.7 C), temperature source Oral, resp. rate 18, height 5' 1 (1.549 m), weight 87.2 kg, last menstrual period 06/24/2012, SpO2 92%.  Physical  Exam: General appearance: alert, cooperative, and no distress Heart: regular rate and rhythm Lungs: few expiratory wheezes. CXR stable.  Abdomen: benign Extremities: no edema or calf tenderness  Discharge Condition: Stable  Recent laboratory studies:  Lab Results  Component Value Date   WBC 10.1 06/23/2024   HGB 11.6 (L) 06/23/2024   HCT 35.0 (L) 06/23/2024   MCV 99.2 06/23/2024   PLT 272 06/23/2024   Lab Results  Component Value Date   NA 134 (L) 06/23/2024   K 4.1 06/23/2024   CL 99 06/23/2024   CO2 24 06/23/2024   CREATININE 0.76 06/23/2024   GLUCOSE 239 (H) 06/23/2024      Diagnostic Studies: DG CHEST PORT 1 VIEW Result Date: 06/25/2024 CLINICAL DATA:  Status post lobectomy. EXAM: PORTABLE CHEST 1 VIEW COMPARISON:  06/24/2024 FINDINGS: Volume loss right hemithorax is similar to prior. Right paratracheal and hilar opacity is stable. Trace bibasilar atelectasis or scarring with probable pleural fluid noted in both lung bases. No pneumothorax. Cardiopericardial silhouette is at upper limits of normal for size. Telemetry leads overlie the chest. IMPRESSION: 1. No discernible pneumothorax on the current study. 2. Stable right paratracheal and hilar opacity with bibasilar atelectasis or scarring and tiny effusions. Electronically Signed   By: Camellia Candle M.D.   On: 06/25/2024 09:43   DG Chest 2 View Result Date: 06/24/2024 EXAM: 2 VIEW(S) XRAY OF THE CHEST 06/24/2024 07:47:00 AM COMPARISON: 06/23/2024 CLINICAL HISTORY: Post chest tube removal, pneumothorax. FINDINGS: LUNGS AND PLEURA: Trace right apical pneumothorax. Trace bilateral pleural effusions. Right hemithorax postsurgical changes. HEART AND MEDIASTINUM: No acute abnormality of the cardiac and mediastinal silhouettes. BONES AND SOFT TISSUES: No acute osseous abnormality. IMPRESSION: 1. Right hemithorax postsurgical changes with removal of the right-sided chest tube. 2. Trace right apical pneumothorax and trace bilateral  pleural effusions. Electronically signed by: Lonni Necessary MD 06/24/2024 11:36 AM EDT RP Workstation: [id number]   DG Chest 1 View Result Date: 06/23/2024 CLINICAL DATA:  Follow up lung lobectomy EXAM: DG CHEST 1V COMPARISON:  06/22/2024 FINDINGS: Right chest tube remains in place. No pneumothorax. Stable volume loss in the right hemithorax. Mild airway thickening on the right. The left lung remains clear. No blunting of the costophrenic angles. Heart size within normal limits. IMPRESSION: 1. Right chest tube remains in place. No pneumothorax. 2. Stable volume loss in the right hemithorax. 3. Mild airway thickening on the right. Electronically Signed   By: Ryan Salvage M.D.   On: 06/23/2024 10:36   DG Chest Port 1 View Result Date: 06/22/2024 CLINICAL DATA:  Postop day 1 status post right upper lobectomy. EXAM: PORTABLE CHEST 1 VIEW COMPARISON:  06/21/2024. FINDINGS: Stable right chest tube with tip directed towards the right lung apex. No definite pneumothorax identified. Redemonstrated postoperative changes related to right upper lobectomy with associated volume loss. Similar right upper paramediastinal opacity. Bilateral perihilar interstitial prominence is not significantly changed and could reflect pulmonary vascular congestion. Stable mild prominence of the cardiopericardial silhouette. Visualized osseous structures are unchanged. IMPRESSION: 1.  Stable right chest tube with tip directed towards the right lung apex. No definite pneumothorax identified. 2. Similar right upper paramediastinal opacity, which could reflect atelectasis or postoperative change. 3. Enlargement of the cardiopericardial silhouette with similar bilateral perihilar interstitial prominence, which could reflect pulmonary vascular congestion. Electronically Signed   By: Harrietta Sherry M.D.   On: 06/22/2024 10:33   DG Chest Port 1 View Result Date: 06/21/2024 CLINICAL DATA:  Status post lobectomy EXAM: PORTABLE CHEST 1  VIEW COMPARISON:  06/20/2024, PET CT 05/11/2024 FINDINGS: Interval postoperative changes of the right thorax consistent with right upper lobectomy. Placement of right-sided chest tube with tip near the apex. Relative hyperlucency at the right upper lung but without discernible pleural line. Right upper paramediastinal lobulated opacity. Mild interstitial densities throughout, could be secondary to minimal edema. The heart appears slightly enlarged. Small volume gas in the right lower chest wall soft tissues. IMPRESSION: 1. Interval postoperative changes of the right thorax consistent with history of right upper lobectomy. Placement of right-sided chest tube with tip near the apex. Relative hyperlucency at the right upper lung but without discernible pleural line to confidently suggest pneumothorax. 2. Right upper paramediastinal lobulated opacity, possible atelectasis or postop change. Attention on follow-up imaging. 3. Cardiomegaly with mild interstitial densities throughout, could be secondary to minimal edema. Electronically Signed   By: Luke Bun M.D.   On: 06/21/2024 17:11   DG Chest 2 View Result Date: 06/21/2024 EXAM: 2 VIEW(S) XRAY OF THE CHEST 06/20/2024 10:56:00 AM COMPARISON: 05/10/2024. Comparison PET scan 1/25. CLINICAL HISTORY: Pre-op chest exam FINDINGS: LUNGS AND PLEURA: The right perihilar bronchogenic carcinoma is again seen. No other focal lesions are present. No focal airspace disease is present. Changes of COPD are present. No pleural effusion. No pneumothorax. HEART AND MEDIASTINUM: No acute abnormality of the cardiac and mediastinal silhouettes. BONES AND SOFT TISSUES: No acute osseous abnormality. IMPRESSION: 1. Right perihilar bronchogenic carcinoma, again seen. 2. Changes of COPD. Electronically signed by: Lonni Necessary MD 06/21/2024 09:10 AM EDT RP Workstation: [id number]   Myocardial Perfusion Imaging Result Date: 06/20/2024   The study is normal. Findings are consistent  with no ischemia and no infarction. The study is low risk.   No ST deviation was noted.   LV perfusion is normal. There is no evidence of ischemia. There is no evidence of infarction.   Left ventricular function is normal. Nuclear stress EF: 75%. The left ventricular ejection fraction is hyperdynamic (>65%). End diastolic cavity size is normal. End systolic cavity size is normal.   CT images were obtained for attenuation correction and were examined for the presence of coronary calcium  when appropriate.   Coronary calcium  was absent on the attenuation correction CT images.  Aortic atherosclerosis.   MR BRAIN W WO CONTRAST Result Date: 06/06/2024 EXAM: MRI BRAIN WITH AND WITHOUT CONTRAST 05/31/2024 05:14:35 PM TECHNIQUE: Multiplanar multisequence MRI of the head/brain was performed with and without the administration of intravenous contrast. COMPARISON: 07/29/2023 CLINICAL HISTORY: Non-small cell lung cancer (NSCLC), staging. FINDINGS: BRAIN AND VENTRICLES: No acute infarct. No acute intracranial hemorrhage. No mass effect or midline shift. No hydrocephalus. The sella is unremarkable. Normal flow voids. No mass or abnormal enhancement. Multifocal hyperintense T2-weighted signal within the cerebral white matter, most commonly due to chronic small vessel disease. ORBITS: No acute abnormality. SINUSES: No acute abnormality. 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 BONES AND SOFT TISSUES: Normal bone marrow signal and enhancement. No acute soft tissue abnormality. IMPRESSION: 1. No acute intracranial abnormality. 2. No mass  or abnormal enhancement. 3. Multifocal hyperintense T2-weighted signal within the cerebral white matter, most commonly due to chronic small vessel disease. Electronically signed by: Franky Stanford MD 06/06/2024 03:33 PM EDT RP Workstation: [id number]         Discharge Medications: Allergies as of 06/25/2024   No Known Allergies      Medication List     STOP taking these medications     HYDROcodone -acetaminophen  5-325 MG tablet Commonly known as: NORCO/VICODIN       TAKE these medications    acetaminophen  500 MG tablet Commonly known as: TYLENOL  Take 500-1,000 mg by mouth every 6 (six) hours as needed (pain.).   albuterol  108 (90 Base) MCG/ACT inhaler Commonly known as: VENTOLIN  HFA Inhale 1-2 puffs into the lungs every 6 (six) hours as needed for wheezing or shortness of breath.   amLODipine  10 MG tablet Commonly known as: NORVASC  Take 1 tablet (10 mg total) by mouth daily.   aspirin  EC 81 MG tablet Take 1 tablet (81 mg total) by mouth daily. Swallow whole.   atorvastatin  40 MG tablet Commonly known as: LIPITOR Take 1 tablet (40 mg total) by mouth daily.   Blood Glucose Monitoring Suppl Devi 1 each by Does not apply route in the morning, at noon, and at bedtime. May substitute to any manufacturer covered by patient's insurance.   BLOOD GLUCOSE TEST STRIPS Strp 1 each by In Vitro route in the morning, at noon, and at bedtime. May substitute to any manufacturer covered by patient's insurance.   Dexcom G7 Receiver Devi Use as directed four times per day E11.9   Dexcom G7 Sensor Misc Use as directed topically one every 10 days E11.9   guaiFENesin  600 MG 12 hr tablet Commonly known as: MUCINEX  Take 1 tablet (600 mg total) by mouth 2 (two) times daily for 8 days.   Lancet Device Misc 1 each by Does not apply route in the morning, at noon, and at bedtime. May substitute to any manufacturer covered by patient's insurance.   Lancets Misc. Misc 1 each by Does not apply route in the morning, at noon, and at bedtime. May substitute to any manufacturer covered by patient's insurance.   lisinopril  5 MG tablet Commonly known as: ZESTRIL  Take 1 tablet (5 mg total) by mouth daily.   methocarbamol  500 MG tablet Commonly known as: ROBAXIN  Take 1 tablet (500 mg total) by mouth every 8 (eight) hours as needed for muscle spasms.   multivitamin tablet Take 1  tablet by mouth in the morning.   oxyCODONE  5 MG immediate release tablet Commonly known as: Oxy IR/ROXICODONE  Take 1 tablet (5 mg total) by mouth every 6 (six) hours as needed for up to 7 days for severe pain (pain score 7-10).   Ozempic  (0.25 or 0.5 MG/DOSE) 2 MG/3ML Sopn Generic drug: Semaglutide (0.25 or 0.5MG /DOS) Take 0.25 mg subcutaneous once weekly E11.9   umeclidinium-vilanterol 62.5-25 MCG/ACT Aepb Commonly known as: Anoro Ellipta  Inhale 1 puff into the lungs daily.   varenicline  1 MG tablet Commonly known as: Chantix  Continuing Month Pak Take 1 tablet (1 mg total) by mouth 2 (two) times daily.               Durable Medical Equipment  (From admission, onward)           Start     Ordered   06/25/24 1147  For home use only DME oxygen  Once       Question Answer Comment  Length of Need 6  Months   Mode or (Route) Nasal cannula   Liters per Minute 2   Frequency Continuous (stationary and portable oxygen unit needed)   Oxygen conserving device No   Oxygen delivery system Gas      06/25/24 1146            Follow Up Appointments:  Follow-up Information     Shyrl Linnie KIDD, MD. Go on 07/06/2024.   Specialty: Cardiothoracic Surgery Why: Your appointment is at 12:50pm.  On the date you see him you will obtain a chest x-ray in the same office complex 1/2-hour prior to the appointment. Contact information: [address] [place] [address] [phone]                 Signed: Laurel JUDITHANN Nottingham 06/25/2024, 11:55 AM

## 2024-06-22 NOTE — Plan of Care (Signed)
  Problem: Education: Goal: Knowledge of General Education information will improve Description: Including pain rating scale, medication(s)/side effects and non-pharmacologic comfort measures Outcome: Progressing   Problem: Nutrition: Goal: Adequate nutrition will be maintained Outcome: Progressing   Problem: Elimination: Goal: Will not experience complications related to urinary retention Outcome: Progressing   Problem: Pain Managment: Goal: General experience of comfort will improve and/or be controlled Outcome: Progressing

## 2024-06-23 ENCOUNTER — Inpatient Hospital Stay (HOSPITAL_COMMUNITY)

## 2024-06-23 LAB — COMPREHENSIVE METABOLIC PANEL WITH GFR
ALT: 22 U/L (ref 0–44)
AST: 38 U/L (ref 15–41)
Albumin: 3.3 g/dL — ABNORMAL LOW (ref 3.5–5.0)
Alkaline Phosphatase: 56 U/L (ref 38–126)
Anion gap: 11 (ref 5–15)
BUN: 10 mg/dL (ref 6–20)
CO2: 24 mmol/L (ref 22–32)
Calcium: 8.7 mg/dL — ABNORMAL LOW (ref 8.9–10.3)
Chloride: 99 mmol/L (ref 98–111)
Creatinine, Ser: 0.76 mg/dL (ref 0.44–1.00)
GFR, Estimated: >60 mL/min (ref >60)
Glucose, Bld: 239 mg/dL — ABNORMAL HIGH (ref 70–99)
Potassium: 4.1 mmol/L (ref 3.5–5.1)
Sodium: 134 mmol/L — ABNORMAL LOW (ref 135–145)
Total Bilirubin: 0.8 mg/dL (ref 0.0–1.2)
Total Protein: 7.1 g/dL (ref 6.5–8.1)

## 2024-06-23 LAB — GLUCOSE, CAPILLARY
Glucose-Capillary: 185 mg/dL — ABNORMAL HIGH (ref 70–99)
Glucose-Capillary: 201 mg/dL — ABNORMAL HIGH (ref 70–99)
Glucose-Capillary: 223 mg/dL — ABNORMAL HIGH (ref 70–99)
Glucose-Capillary: 238 mg/dL — ABNORMAL HIGH (ref 70–99)
Glucose-Capillary: 260 mg/dL — ABNORMAL HIGH (ref 70–99)

## 2024-06-23 LAB — CBC
HCT: 35 % — ABNORMAL LOW (ref 36.0–46.0)
Hemoglobin: 11.6 g/dL — ABNORMAL LOW (ref 12.0–15.0)
MCH: 32.9 pg (ref 26.0–34.0)
MCHC: 33.1 g/dL (ref 30.0–36.0)
MCV: 99.2 fL (ref 80.0–100.0)
Platelets: 272 K/uL (ref 150–400)
RBC: 3.53 MIL/uL — ABNORMAL LOW (ref 3.87–5.11)
RDW: 12.1 % (ref 11.5–15.5)
WBC: 10.1 K/uL (ref 4.0–10.5)
nRBC: 0 % (ref 0.0–0.2)

## 2024-06-23 MED ORDER — INSULIN GLARGINE 100 UNIT/ML ~~LOC~~ SOLN
20.0000 [IU] | Freq: Two times a day (BID) | SUBCUTANEOUS | Status: DC
  Administered 2024-06-23 – 2024-06-24 (×3): 20 [IU] via SUBCUTANEOUS
  Filled 2024-06-23 (×4): qty 0.2

## 2024-06-23 NOTE — Progress Notes (Addendum)
 2 Days Post-Op Procedure(s) (LRB): LOBECTOMY, LUNG, ROBOT-ASSISTED, USING VATS (Right) BIOPSY, LYMPH NODE (Right) BLOCK, NERVE, INTERCOSTAL (Right) Subjective: Resting in bed, pain controlled.  O2 at 1Lnc with adequate O2 sats.   Objective: Vital signs in last 24 hours: Temp:  [97.7 F (36.5 C)-98.6 F (37 C)] 98.6 F (37 C) (09/13 0745) Pulse Rate:  [48-64] 55 (09/13 0800) Cardiac Rhythm: Sinus bradycardia (09/13 0700) Resp:  [14-20] 14 (09/13 0800) BP: (114-161)/(66-87) 114/69 (09/13 0745) SpO2:  [93 %-98 %] 98 % (09/13 0800) Weight:  [87.2 kg] 87.2 kg (09/13 0447)    Intake/Output from previous day: 09/12 0701 - 09/13 0700 In: 720 [P.O.:720] Out: 134 [Chest Tube:134] Intake/Output this shift: Total I/O In: 354 [P.O.:354] Out: 40 [Chest Tube:40]  General appearance: alert, cooperative, and no distress Heart: regular rate and rhythm Lungs: wheezes bilat. No air leak, CT drainage has slowed.  Abdomen: benign Extremities: no edema or calf tenderness Wound: incis ok  Lab Results: Recent Labs    06/22/24 0735 06/23/24 0205  WBC 12.8* 10.1  HGB 12.6 11.6*  HCT 37.3 35.0*  PLT 284 272   BMET:  Recent Labs    06/22/24 0735 06/23/24 0205  NA 133* 134*  K 4.1 4.1  CL 102 99  CO2 19* 24  GLUCOSE 191* 239*  BUN 6 10  CREATININE 0.68 0.76  CALCIUM  8.5* 8.7*    PT/INR:  Recent Labs    06/20/24 1100  LABPROT 14.2  INR 1.0   ABG    Component Value Date/Time   PHART 7.247 (L) 06/21/2024 1528   HCO3 27.5 06/21/2024 1528   TCO2 30 06/21/2024 1528   ACIDBASEDEF 1.0 06/21/2024 1528   O2SAT 98 06/21/2024 1528   CBG (last 3)  Recent Labs    06/22/24 2128 06/23/24 0037 06/23/24 0604  GLUCAP 243* 260* 223*    Meds Scheduled Meds:  acetaminophen   1,000 mg Oral Q6H   Or   acetaminophen  (TYLENOL ) oral liquid 160 mg/5 mL  1,000 mg Oral Q6H   amLODipine   10 mg Oral Daily   aspirin  EC  81 mg Oral Daily   atorvastatin   40 mg Oral Daily   bisacodyl   10  mg Oral Daily   enoxaparin  (LOVENOX ) injection  40 mg Subcutaneous Daily   insulin  aspart  0-24 Units Subcutaneous TID AC & HS   ketorolac   15 mg Intravenous Q6H   pantoprazole   40 mg Oral Daily   senna-docusate  1 tablet Oral QHS   umeclidinium-vilanterol  1 puff Inhalation Daily   Continuous Infusions: PRN Meds:.albuterol , methocarbamol , morphine  injection, ondansetron  (ZOFRAN ) IV, mouth rinse, oxyCODONE , traMADol   Xrays DG Chest Port 1 View Result Date: 06/22/2024 CLINICAL DATA:  Postop day 1 status post right upper lobectomy. EXAM: PORTABLE CHEST 1 VIEW COMPARISON:  06/21/2024. FINDINGS: Stable right chest tube with tip directed towards the right lung apex. No definite pneumothorax identified. Redemonstrated postoperative changes related to right upper lobectomy with associated volume loss. Similar right upper paramediastinal opacity. Bilateral perihilar interstitial prominence is not significantly changed and could reflect pulmonary vascular congestion. Stable mild prominence of the cardiopericardial silhouette. Visualized osseous structures are unchanged. IMPRESSION: 1. Stable right chest tube with tip directed towards the right lung apex. No definite pneumothorax identified. 2. Similar right upper paramediastinal opacity, which could reflect atelectasis or postoperative change. 3. Enlargement of the cardiopericardial silhouette with similar bilateral perihilar interstitial prominence, which could reflect pulmonary vascular congestion. Electronically Signed   By: Harrietta Marcelino HERO.D.  On: 06/22/2024 10:33   DG Chest Port 1 View Result Date: 06/21/2024 CLINICAL DATA:  Status post lobectomy EXAM: PORTABLE CHEST 1 VIEW COMPARISON:  06/20/2024, PET CT 05/11/2024 FINDINGS: Interval postoperative changes of the right thorax consistent with right upper lobectomy. Placement of right-sided chest tube with tip near the apex. Relative hyperlucency at the right upper lung but without discernible pleural  line. Right upper paramediastinal lobulated opacity. Mild interstitial densities throughout, could be secondary to minimal edema. The heart appears slightly enlarged. Small volume gas in the right lower chest wall soft tissues. IMPRESSION: 1. Interval postoperative changes of the right thorax consistent with history of right upper lobectomy. Placement of right-sided chest tube with tip near the apex. Relative hyperlucency at the right upper lung but without discernible pleural line to confidently suggest pneumothorax. 2. Right upper paramediastinal lobulated opacity, possible atelectasis or postop change. Attention on follow-up imaging. 3. Cardiomegaly with mild interstitial densities throughout, could be secondary to minimal edema. Electronically Signed   By: Luke Bun M.D.   On: 06/21/2024 17:11    Assessment/Plan: S/P Procedure(s) (LRB): LOBECTOMY, LUNG, ROBOT-ASSISTED, USING VATS (Right) BIOPSY, LYMPH NODE (Right) BLOCK, NERVE, INTERCOSTAL (Right)  -POD#1 robotic-assisted right upper lobectomy. Stable resp status. No air leak and drainage has slowed. CXR showing both lungs well expanded. Will remove the chest tube today.   -History of COPD with heavy tobacco abuse- Wheezing but oxygenating well. Continue albuterol  and Anoro Ellipta  in halers.   -ENDO- type 2 DM with poor control. Add Lantus  20 units [place] BID, continue SSI.   -History of HTN- control better after re-starting her Norvasc .  -DVT PPX- continue Lovenox  and ambulate.      LOS: 2 days    Laurel JUDITHANN Becket PA-C Pager [phone] 06/23/2024  Patient seen and examined, agree with above Dc chest tube Increase activity CBG poorly controlled- add long acting insulin   Elspeth C. Kerrin, MD [facility] [phone]

## 2024-06-23 NOTE — Progress Notes (Signed)
 Pt ambulated halls with RN. Pt used front wheel walker. Pt did c/o sob but no desatting on 3L [place].

## 2024-06-23 NOTE — Plan of Care (Signed)
   Problem: Education: Goal: Knowledge of General Education information will improve Description Including pain rating scale, medication(s)/side effects and non-pharmacologic comfort measures Outcome: Progressing

## 2024-06-23 NOTE — Progress Notes (Signed)
 Mobility Specialist Progress Note:   06/23/24 1500  Mobility  Activity Ambulated with assistance  Level of Assistance Standby assist, set-up cues, supervision of patient - no hands on  Assistive Device Front wheel walker  Distance Ambulated (ft) 150 ft  Activity Response Tolerated well  Mobility Referral Yes  Mobility visit 1 Mobility  Mobility Specialist Start Time (ACUTE ONLY) 1500  Mobility Specialist Stop Time (ACUTE ONLY) 1515  Mobility Specialist Time Calculation (min) (ACUTE ONLY) 15 min   Pt agreeable to mobility session. Required no physical assistance to ambulate with RW. Feeling much improved post chest tube removal. Still requiring up to 3LO2 to maintain SpO2 WFL.   Therisa Rana Mobility Specialist Please contact via SecureChat or  Rehab office at [phone]

## 2024-06-24 ENCOUNTER — Inpatient Hospital Stay (HOSPITAL_COMMUNITY)

## 2024-06-24 DIAGNOSIS — J939 Pneumothorax, unspecified: Secondary | ICD-10-CM | POA: Diagnosis not present

## 2024-06-24 DIAGNOSIS — J90 Pleural effusion, not elsewhere classified: Secondary | ICD-10-CM | POA: Diagnosis not present

## 2024-06-24 DIAGNOSIS — Z4682 Encounter for fitting and adjustment of non-vascular catheter: Secondary | ICD-10-CM | POA: Diagnosis not present

## 2024-06-24 LAB — GLUCOSE, CAPILLARY
Glucose-Capillary: 158 mg/dL — ABNORMAL HIGH (ref 70–99)
Glucose-Capillary: 250 mg/dL — ABNORMAL HIGH (ref 70–99)
Glucose-Capillary: 208 mg/dL — ABNORMAL HIGH (ref 70–99)
Glucose-Capillary: 219 mg/dL — ABNORMAL HIGH (ref 70–99)

## 2024-06-24 MED ORDER — LISINOPRIL 5 MG PO TABS
5.0000 mg | ORAL_TABLET | Freq: Every day | ORAL | Status: DC
  Administered 2024-06-24 – 2024-06-25 (×2): 5 mg via ORAL
  Filled 2024-06-24 (×2): qty 1

## 2024-06-24 MED ORDER — INSULIN ASPART 100 UNIT/ML IJ SOLN: 3.0000 [IU] | Freq: Three times a day (TID) | INTRAMUSCULAR | Status: DC

## 2024-06-24 MED ORDER — INSULIN GLARGINE 100 UNIT/ML ~~LOC~~ SOLN
25.0000 [IU] | Freq: Two times a day (BID) | SUBCUTANEOUS | Status: DC
  Administered 2024-06-24 – 2024-06-25 (×2): 25 [IU] via SUBCUTANEOUS
  Filled 2024-06-24 (×3): qty 0.25

## 2024-06-24 MED ORDER — GUAIFENESIN ER 600 MG PO TB12
600.0000 mg | ORAL_TABLET | Freq: Two times a day (BID) | ORAL | Status: DC
  Administered 2024-06-24 – 2024-06-25 (×3): 600 mg via ORAL
  Filled 2024-06-24 (×3): qty 1

## 2024-06-24 MED ORDER — INSULIN ASPART 100 UNIT/ML IJ SOLN
6.0000 [IU] | Freq: Three times a day (TID) | INTRAMUSCULAR | Status: DC
  Administered 2024-06-24 – 2024-06-25 (×2): 6 [IU] via SUBCUTANEOUS

## 2024-06-24 NOTE — Progress Notes (Signed)
 3 Days Post-Op Procedure(s) (LRB): LOBECTOMY, LUNG, ROBOT-ASSISTED, USING VATS (Right) BIOPSY, LYMPH NODE (Right) BLOCK, NERVE, INTERCOSTAL (Right) Subjective: Resting in bed, pain controlled.  Still on O2 at 2Lnc with adequate O2 sats. Having some itching around chest incisions.  Objective: Vital signs in last 24 hours: Temp:  [98 F (36.7 C)-98.6 F (37 C)] 98.3 F (36.8 C) (09/14 0753) Pulse Rate:  [50-65] 65 (09/14 0753) Cardiac Rhythm: Sinus bradycardia (09/14 0700) Resp:  [14-20] 17 (09/14 0753) BP: (118-155)/(57-96) 120/72 (09/14 0753) SpO2:  [92 %-99 %] 92 % (09/14 0753)    Intake/Output from previous day: 09/13 0701 - 09/14 0700 In: 1134 [P.O.:1134] Out: 40 [Chest Tube:40] Intake/Output this shift: No intake/output data recorded.  General appearance: alert, cooperative, and no distress Heart: regular rate and rhythm Lungs:breath sounds clearer today. Chest tube out, CXR stable. The chest tube site is covere with a dry dressing. No sign of rash. Abdomen: benign Extremities: no edema or calf tenderness   Lab Results: Recent Labs    06/22/24 0735 06/23/24 0205  WBC 12.8* 10.1  HGB 12.6 11.6*  HCT 37.3 35.0*  PLT 284 272   BMET:  Recent Labs    06/22/24 0735 06/23/24 0205  NA 133* 134*  K 4.1 4.1  CL 102 99  CO2 19* 24  GLUCOSE 191* 239*  BUN 6 10  CREATININE 0.68 0.76  CALCIUM  8.5* 8.7*    PT/INR:  No results for input(s): LABPROT, INR in the last 72 hours.  ABG    Component Value Date/Time   PHART 7.247 (L) 06/21/2024 1528   HCO3 27.5 06/21/2024 1528   TCO2 30 06/21/2024 1528   ACIDBASEDEF 1.0 06/21/2024 1528   O2SAT 98 06/21/2024 1528   CBG (last 3)  Recent Labs    06/23/24 1618 06/23/24 2114 06/24/24 0608  GLUCAP 185* 238* 158*    Meds Scheduled Meds:  acetaminophen   1,000 mg Oral Q6H   Or   acetaminophen  (TYLENOL ) oral liquid 160 mg/5 mL  1,000 mg Oral Q6H   amLODipine   10 mg Oral Daily   aspirin  EC  81 mg Oral Daily    atorvastatin   40 mg Oral Daily   bisacodyl   10 mg Oral Daily   enoxaparin  (LOVENOX ) injection  40 mg Subcutaneous Daily   insulin  aspart  0-24 Units Subcutaneous TID AC & HS   insulin  glargine  20 Units Subcutaneous BID   pantoprazole   40 mg Oral Daily   senna-docusate  1 tablet Oral QHS   umeclidinium-vilanterol  1 puff Inhalation Daily   Continuous Infusions: PRN Meds:.albuterol , methocarbamol , morphine  injection, ondansetron  (ZOFRAN ) IV, mouth rinse, oxyCODONE , traMADol   Xrays DG Chest 1 View Result Date: 06/23/2024 CLINICAL DATA:  Follow up lung lobectomy EXAM: DG CHEST 1V COMPARISON:  06/22/2024 FINDINGS: Right chest tube remains in place. No pneumothorax. Stable volume loss in the right hemithorax. Mild airway thickening on the right. The left lung remains clear. No blunting of the costophrenic angles. Heart size within normal limits. IMPRESSION: 1. Right chest tube remains in place. No pneumothorax. 2. Stable volume loss in the right hemithorax. 3. Mild airway thickening on the right. Electronically Signed   By: Ryan Salvage M.D.   On: 06/23/2024 10:36    Assessment/Plan: S/P Procedure(s) (LRB): LOBECTOMY, LUNG, ROBOT-ASSISTED, USING VATS (Right) BIOPSY, LYMPH NODE (Right) BLOCK, NERVE, INTERCOSTAL (Right)  -POD3 robotic-assisted right upper lobectomy. Stable resp status post chest tube removal.   -History of COPD with heavy tobacco abuse- Oxygenating well  on  2LNC. Continue albuterol  and Anoro Ellipta  in halers. Add Mucinex  for productive cough. Work on pulmonary hygiene and 02 wean  -ENDO- type 2 DM with poor control. Continue Lantus  20 units [place] BID, continue SSI. Add Novalog 3 units with meals.    -History of HTN- control better after re-starting her Norvasc  but still tend to be hypertensive.  HR low at baseline, doubt she would tolerate a beta blocker. Will add lisinopril .  -DVT PPX- continue Lovenox  and ambulate.    LOS: 3 days    Laurel JUDITHANN Becket  PA-C Pager [phone] 06/24/2024

## 2024-06-24 NOTE — Plan of Care (Signed)
  Problem: Education: Goal: Knowledge of General Education information will improve Description: Including pain rating scale, medication(s)/side effects and non-pharmacologic comfort measures Outcome: Progressing   Problem: Health Behavior/Discharge Planning: Goal: Ability to manage health-related needs will improve Outcome: Progressing   Problem: Clinical Measurements: Goal: Ability to maintain clinical measurements within normal limits will improve Outcome: Progressing Goal: Will remain free from infection Outcome: Progressing   Problem: Elimination: Goal: Will not experience complications related to bowel motility Outcome: Progressing

## 2024-06-25 ENCOUNTER — Inpatient Hospital Stay (HOSPITAL_COMMUNITY)

## 2024-06-25 ENCOUNTER — Other Ambulatory Visit (HOSPITAL_COMMUNITY): Payer: Self-pay

## 2024-06-25 DIAGNOSIS — C3490 Malignant neoplasm of unspecified part of unspecified bronchus or lung: Secondary | ICD-10-CM | POA: Diagnosis not present

## 2024-06-25 LAB — POCT I-STAT 7, (LYTES, BLD GAS, ICA,H+H)
Acid-Base Excess: 0 mmol/L (ref 0.0–2.0)
Bicarbonate: 27.8 mmol/L (ref 20.0–28.0)
Calcium, Ion: 1.14 mmol/L — ABNORMAL LOW (ref 1.15–1.40)
HCT: 36 % (ref 36.0–46.0)
Hemoglobin: 12.2 g/dL (ref 12.0–15.0)
O2 Saturation: 100 %
Patient temperature: 35.4
Potassium: 3.7 mmol/L (ref 3.5–5.1)
Sodium: 142 mmol/L (ref 135–145)
TCO2: 30 mmol/L (ref 22–32)
pCO2 arterial: 56.7 mmHg — ABNORMAL HIGH (ref 32–48)
pH, Arterial: 7.29 — ABNORMAL LOW (ref 7.35–7.45)
pO2, Arterial: 433 mmHg — ABNORMAL HIGH (ref 83–108)

## 2024-06-25 LAB — GLUCOSE, CAPILLARY
Glucose-Capillary: 225 mg/dL — ABNORMAL HIGH (ref 70–99)
Glucose-Capillary: 181 mg/dL — ABNORMAL HIGH (ref 70–99)

## 2024-06-25 LAB — SURGICAL PATHOLOGY

## 2024-06-25 MED ORDER — OXYCODONE HCL 5 MG PO TABS
5.0000 mg | ORAL_TABLET | Freq: Four times a day (QID) | ORAL | 0 refills | Status: AC | PRN
  Filled 2024-06-25: qty 20, 5d supply, fill #0

## 2024-06-25 MED ORDER — GUAIFENESIN ER 600 MG PO TB12
600.0000 mg | ORAL_TABLET | Freq: Two times a day (BID) | ORAL | 0 refills | Status: AC
  Filled 2024-06-25: qty 16, 8d supply, fill #0

## 2024-06-25 MED ORDER — LISINOPRIL 5 MG PO TABS
5.0000 mg | ORAL_TABLET | Freq: Every day | ORAL | 2 refills | Status: DC
  Filled 2024-06-25: qty 30, 30d supply, fill #0

## 2024-06-25 NOTE — Progress Notes (Signed)
 SATURATION QUALIFICATIONS: (This note is used to comply with regulatory documentation for home oxygen)  Patient Saturations on Room Air at Rest = 93%  Patient Saturations on Room Air while Ambulating = 84%  Patient Saturations on 2 Liters of oxygen while Ambulating = 88%  Please briefly explain why patient needs home oxygen:

## 2024-06-25 NOTE — Progress Notes (Signed)
  Myrna Lauraine FALCON, RN  Registered Nurse   Progress Notes    Signed   Date of Service: 06/25/2024 11:20 AM   Signed      SATURATION QUALIFICATIONS: (This note is used to comply with regulatory documentation for home oxygen)   Patient Saturations on Room Air at Rest = 93%   Patient Saturations on Room Air while Ambulating = 84%   Patient Saturations on 2 Liters of oxygen while Ambulating = 88%   Please briefly explain why patient needs home oxygen:             Durable Medical Equipment (From admission, onward)        Start     Ordered  06/25/24 1147  For home use only DME oxygen  Once      Question Answer Comment Length of Need 6 Months  Mode or (Route) Nasal cannula  Liters per Minute 2  Frequency Continuous (stationary and portable oxygen unit needed)  Oxygen conserving device No  Oxygen delivery system Gas    06/25/24 1146

## 2024-06-25 NOTE — TOC Transition Note (Signed)
 Transition of Care [facility]) - Discharge Note   Patient Details  Name: Peggy Warren MRN: [id number] Date of Birth: 1965-12-19  Transition of Care [facility]) CM/SW Contact:  Roxie KANDICE Stain, RN Phone Number: 06/25/2024, 12:45 PM   Clinical Narrative:    [name] is stable to discharge home. Follow up apt on AVS.Order for home 02. Notified Mitch with [employer] of order.  Patient will be staying with daughter. Address: [address], [place], [place] [address]. PCP and Mobile verified.  No ICM (Inpatient Care Management) needs at this time.    Final next level of care: Home/Self Care Barriers to Discharge: Barriers Resolved   Patient Goals and CMS Choice Patient states their goals for this hospitalization and ongoing recovery are:: return home CMS Medicare.gov Compare Post Acute Care list provided to:: Patient Choice offered to / list presented to : Patient      Discharge Placement               Home        Discharge Plan and Services Additional resources added to the After Visit Summary for                  DME Arranged: Oxygen DME Agency: [employer] Date DME Agency Contacted: 06/25/24 Time DME Agency Contacted: 1225 Representative spoke with at DME Agency: [place]            Social Drivers of Health (SDOH) Interventions SDOH Screenings   Food Insecurity: No Food Insecurity (06/21/2024)  Recent Concern: Food Insecurity - Food Insecurity Present (05/23/2024)  Housing: Low Risk  (06/21/2024)  Transportation Needs: No Transportation Needs (06/21/2024)  Utilities: Not At Risk (06/21/2024)  Depression (PHQ2-9): Low Risk  (05/23/2024)  Tobacco Use: Medium Risk (06/21/2024)     Readmission Risk Interventions    06/25/2024   12:45 PM  Readmission Risk Prevention Plan  Transportation Screening Complete  PCP or Specialist Appt within 5-7 Days Complete  Home Care Screening Complete  Medication Review (RN CM) Complete

## 2024-06-25 NOTE — Progress Notes (Signed)
 4 Days Post-Op Procedure(s) (LRB): LOBECTOMY, LUNG, ROBOT-ASSISTED, USING VATS (Right) BIOPSY, LYMPH NODE (Right) BLOCK, NERVE, INTERCOSTAL (Right) Subjective: Sitting up finishing breakfast.  Had a good day yesterday, no new problems.  Now on RA with adequate O2 sats. BM yesterday.   Objective: Vital signs in last 24 hours: Temp:  [97.7 F (36.5 C)-98.6 F (37 C)] 97.9 F (36.6 C) (09/15 0429) Pulse Rate:  [59-72] 60 (09/15 0429) Cardiac Rhythm: Normal sinus rhythm (09/14 2000) Resp:  [15-19] 16 (09/15 0429) BP: (101-149)/(61-80) 115/64 (09/15 0429) SpO2:  [92 %-98 %] 96 % (09/15 0429)    Intake/Output from previous day: 09/14 0701 - 09/15 0700 In: 720 [P.O.:720] Out: -  Intake/Output this shift: No intake/output data recorded.  General appearance: alert, cooperative, and no distress Heart: regular rate and rhythm Lungs: few expiratory wheezes. CXR stable.  Abdomen: benign Extremities: no edema or calf tenderness   Lab Results: Recent Labs    06/22/24 0735 06/23/24 0205  WBC 12.8* 10.1  HGB 12.6 11.6*  HCT 37.3 35.0*  PLT 284 272   BMET:  Recent Labs    06/22/24 0735 06/23/24 0205  NA 133* 134*  K 4.1 4.1  CL 102 99  CO2 19* 24  GLUCOSE 191* 239*  BUN 6 10  CREATININE 0.68 0.76  CALCIUM  8.5* 8.7*    PT/INR:  No results for input(s): LABPROT, INR in the last 72 hours.  ABG    Component Value Date/Time   PHART 7.247 (L) 06/21/2024 1528   HCO3 27.5 06/21/2024 1528   TCO2 30 06/21/2024 1528   ACIDBASEDEF 1.0 06/21/2024 1528   O2SAT 98 06/21/2024 1528   CBG (last 3)  Recent Labs    06/24/24 1608 06/24/24 2111 06/25/24 0613  GLUCAP 208* 219* 225*    Meds Scheduled Meds:  acetaminophen   1,000 mg Oral Q6H   Or   acetaminophen  (TYLENOL ) oral liquid 160 mg/5 mL  1,000 mg Oral Q6H   amLODipine   10 mg Oral Daily   aspirin  EC  81 mg Oral Daily   atorvastatin   40 mg Oral Daily   bisacodyl   10 mg Oral Daily   enoxaparin  (LOVENOX )  injection  40 mg Subcutaneous Daily   guaiFENesin   600 mg Oral BID   insulin  aspart  0-24 Units Subcutaneous TID AC & HS   insulin  aspart  6 Units Subcutaneous TID WC   insulin  glargine  25 Units Subcutaneous BID   lisinopril   5 mg Oral Daily   pantoprazole   40 mg Oral Daily   senna-docusate  1 tablet Oral QHS   umeclidinium-vilanterol  1 puff Inhalation Daily   Continuous Infusions: PRN Meds:.albuterol , methocarbamol , morphine  injection, ondansetron  (ZOFRAN ) IV, mouth rinse, oxyCODONE , traMADol   Xrays DG Chest 2 View Result Date: 06/24/2024 EXAM: 2 VIEW(S) XRAY OF THE CHEST 06/24/2024 07:47:00 AM COMPARISON: 06/23/2024 CLINICAL HISTORY: Post chest tube removal, pneumothorax. FINDINGS: LUNGS AND PLEURA: Trace right apical pneumothorax. Trace bilateral pleural effusions. Right hemithorax postsurgical changes. HEART AND MEDIASTINUM: No acute abnormality of the cardiac and mediastinal silhouettes. BONES AND SOFT TISSUES: No acute osseous abnormality. IMPRESSION: 1. Right hemithorax postsurgical changes with removal of the right-sided chest tube. 2. Trace right apical pneumothorax and trace bilateral pleural effusions. Electronically signed by: Lonni Necessary MD 06/24/2024 11:36 AM EDT RP Workstation: [id number]    Assessment/Plan: S/P Procedure(s) (LRB): LOBECTOMY, LUNG, ROBOT-ASSISTED, USING VATS (Right) BIOPSY, LYMPH NODE (Right) BLOCK, NERVE, INTERCOSTAL (Right)  -POD4 robotic-assisted right upper lobectomy. Stable resp status post chest tube removal.   -  History of COPD with heavy tobacco abuse- Oxygenating well  on RA. Continue her albuterol  and Anoro Ellipta  in halers.  Encouraged to avoid smoking and to continue work on pulmonary hygiene at home.  -ENDO- type 2 DM with reasonable control. To resume her usual medications at discharge.   -History of HTN- controlled on Norvasc  and lisinopril .  -Disposition: discharge to home today. Instructions given and follow up arranged.     LOS: 4 days    Peggy JUDITHANN Becket PA-C Pager [phone] 06/25/2024

## 2024-06-25 NOTE — Telephone Encounter (Signed)
 Copied from CRM #[id number]. Topic: Medical Record Request - Other >> Jun 25, 2024 10:36 AM Suzen RAMAN wrote: Reason for CRM: Rosaline would like a call back to confirm receipt of paperwork(Physician order  and [place] CMN) faxed over to the office on 06/19/24, 06/20/24 and 06/21/24 for Incontinence supplies.  CB#[phone] [id number] [facility]

## 2024-06-25 NOTE — Telephone Encounter (Signed)
 Called and spoke with [place]. Informed her that forms had been completed and faxed with confirmation. She expressed understanding and would follow up if anything needed to be updated

## 2024-06-26 ENCOUNTER — Telehealth: Payer: Self-pay | Admitting: *Deleted

## 2024-06-26 NOTE — Transitions of Care (Post Inpatient/ED Visit) (Signed)
 06/26/2024  Name: Peggy Warren MRN: [id number] DOB: 04/12/1966  Today's TOC FU Call Status: Today's TOC FU Call Status:: Successful TOC FU Call Completed TOC FU Call Complete Date: 06/26/24 Patient's Name and Date of Birth confirmed.  Transition Care Management Follow-up Telephone Call Date of Discharge: 06/25/24 Discharge Facility: [facility]) Type of Discharge: Inpatient Admission Primary Inpatient Discharge Diagnosis:: Surgical (R) VATS secondary to lung CA How have you been since you were released from the hospital?: Better (I am doing fine, just a little sore and numb where the surgery was done; they told me to expect that.  Staying with my daughter for a few days before I go back to my home.  I am pretty independent and hope to stay that way.  Using the oxygen and IS) Any questions or concerns?: No  Items Reviewed: Did you receive and understand the discharge instructions provided?: Yes (thoroughly reviewed with patient who verbalizes good understanding of same) Medications obtained,verified, and reconciled?: Yes (Medications Reviewed) (Full medication reconciliation/ review completed; no concerns or discrepancies identified; confirmed patient obtained/ is taking all newly Rx'd medications as instructed; self-manages medications and denies questions/ concerns around medications today) Any new allergies since your discharge?: No Dietary orders reviewed?: Yes Type of Diet Ordered:: I eat healthy, as healthy as I can, I watch my salt intake Do you have support at home?: Yes People in Home [RPT]: alone Name of Support/Comfort Primary Source: Reports essentially independent in self-care activities; normally resides alone: temporarily residing with daughter Peggy Warren post- recent surgery: supportive daughter assists as/ if needed/ indicated at baseline and post- recent hospital discharge  Medications Reviewed Today: Medications Reviewed Today     Reviewed by Andalyn Heckstall M,  RN (Registered Nurse) on 06/26/24 at 1036  Med List Status: <None>   Medication Order Taking? Sig Documenting Provider Last Dose Status Informant  acetaminophen  (TYLENOL ) 500 MG tablet [id number] Yes Take 500-1,000 mg by mouth every 6 (six) hours as needed (pain.). [provider]  Active Self  albuterol  (VENTOLIN  HFA) 108 (90 Base) MCG/ACT inhaler [id number] Yes Inhale 1-2 puffs into the lungs every 6 (six) hours as needed for wheezing or shortness of breath. [provider]  Active Self  amLODipine  (NORVASC ) 10 MG tablet [id number] Yes Take 1 tablet (10 mg total) by mouth daily. [name], MD  Active Self  aspirin  EC 81 MG tablet [id number] Yes Take 1 tablet (81 mg total) by mouth daily. Swallow whole. Gonfa, Taye T, MD  Active Self  atorvastatin  (LIPITOR) 40 MG tablet [id number] Yes Take 1 tablet (40 mg total) by mouth daily. [name], MD  Active Self  Blood Glucose Monitoring Suppl DEVI [id number] Yes 1 each by Does not apply route in the morning, at noon, and at bedtime. May substitute to any manufacturer covered by patient's insurance. [name], MD  Active   Continuous Glucose Receiver [facility] G7 [place]) [place] [id number]  Use as directed four times per day E11.9  Patient not taking: Reported on 06/26/2024   [name], MD  Active Self  Continuous Glucose Sensor (DEXCOM G7 [place]) [place] [id number]  Use as directed topically one every 10 days E11.9  Patient not taking: Reported on 06/26/2024   [name], MD  Active Self  Glucose Blood (BLOOD GLUCOSE TEST STRIPS) STRP [id number] Yes 1 each by In Vitro route in the morning, at noon, and at bedtime. May substitute to any manufacturer covered by patient's insurance. [name], MD  Active   guaiFENesin  (MUCINEX ) 600 MG 12 hr tablet [id number] Yes Take 1 tablet (600 mg total) by mouth 2 (two) times daily for 8 days. Roddenberry, Myron G, PA-C  Active   Lancet Device MISC [id number] Yes 1 each by Does not apply route in  the morning, at noon, and at bedtime. May substitute to any manufacturer covered by patient's insurance. [name], MD  Active   Lancets Misc. MISC [id number] Yes 1 each by Does not apply route in the morning, at noon, and at bedtime. May substitute to any manufacturer covered by patient's insurance. [name], MD  Active   lisinopril  (ZESTRIL ) 5 MG tablet [id number] Yes Take 1 tablet (5 mg total) by mouth daily. Roddenberry, Myron G, PA-C  Active   methocarbamol  (ROBAXIN ) 500 MG tablet [id number] Yes Take 1 tablet (500 mg total) by mouth every 8 (eight) hours as needed for muscle spasms. [name], MD  Active Self  Multiple Vitamin (MULTIVITAMIN) tablet [id number] Yes Take 1 tablet by mouth in the morning. [provider]  Active Self  oxyCODONE  (OXY IR/ROXICODONE ) 5 MG immediate release tablet [id number] Yes Take 1 tablet (5 mg total) by mouth every 6 (six) hours as needed for up to 7 days for severe pain (pain score 7-10). Roddenberry, Myron G, PA-C  Active   Semaglutide ,0.25 or 0.5MG /DOS, (OZEMPIC , 0.25 OR 0.5 MG/DOSE,) 2 MG/3ML SOPN [id number] Yes Take 0.25 mg subcutaneous once weekly E11.9  Patient taking differently: 06/26/24: Reports during TOC call has not yet started- to discuss with PCP tomorrow  Take 0.25 mg subcutaneous once weekly E11.9   [name], MD  Active Self           Med Note CLAUD, MICHEAL T   Tue Jun 12, 2024 10:07 AM) Patient has not started (newly prescribed)  umeclidinium-vilanterol (ANORO ELLIPTA ) 62.5-25 MCG/ACT AEPB [id number] Yes Inhale 1 puff into the lungs daily. Isadora Hose, MD  Active Self  varenicline  (CHANTIX  CONTINUING MONTH PAK) 1 MG tablet [id number] Yes Take 1 tablet (1 mg total) by mouth 2 (two) times daily. [name], MD  Active Self           Med Note CLAUD, MICHEAL T   Tue Jun 12, 2024 10:08 AM) On hold per patient preference           Home Care and Equipment/Supplies: Were Home Health Services Ordered?: No Any new  equipment or medical supplies ordered?: Yes (home O2) Name of Medical supply agency?: [employer] [facility] Were you able to get the equipment/medical supplies?: Yes Do you have any questions related to the use of the equipment/supplies?: No  Functional Questionnaire: Do you need assistance with bathing/showering or dressing?: Yes (Daughter supervising/ assisting as indicated) Do you need assistance with meal preparation?: Yes (Daughter supervising/ assisting as indicated) Do you need assistance with eating?: No Do you have difficulty maintaining continence: No Do you need assistance with getting out of bed/getting out of a chair/moving?: No Do you have difficulty managing or taking your medications?: No  Follow up appointments reviewed: PCP Follow-up appointment confirmed?: Yes Date of PCP follow-up appointment?: 06/27/24 Follow-up Provider: PCP- Dr. Norleen Specialist [facility] Follow-up appointment confirmed?: Yes Date of Specialist follow-up appointment?: 07/06/24 Follow-Up Specialty Provider:: Surgical provider- Dr. Shyrl Do you need transportation to your follow-up appointment?: No Do you understand care options if your condition(s) worsen?: Yes-patient verbalized understanding  SDOH Interventions Today    Flowsheet Row Most Recent Value  SDOH Interventions  Food Insecurity Interventions Intervention Not Indicated  [Denies food insecurity during TOC call today]  Housing Interventions Intervention Not Indicated  Transportation Interventions Intervention Not Indicated  [uses medicaid transportation at baseline,  daughter also assisting with transportation as indicated]  Utilities Interventions Intervention Not Indicated   See TOC assessment tabs and care plan for additional assessment/ TOC intervention information  Successfully enrolled into 30-day TOC program- PCP made aware  Pls call/ message for questions,  Einer Meals Mckinney Declynn Lopresti, RN, BSN, Media planner   Transitions of Care  [facility] [phone]: direct office

## 2024-06-26 NOTE — Patient Instructions (Signed)
 Visit Information  Thank you for taking time to visit with me today. Please don't hesitate to contact me if I can be of assistance to you before our next scheduled telephone appointment.  Our next appointment is by telephone on Wednesday, July 04, 2024 at 10:00 am  Please call the care guide team at [phone] if you need to cancel or reschedule your appointment.   Patient Self Care Activities:  Attend all scheduled provider appointments Call provider office for new concerns or questions  Participate in Transition of Care Program/Attend TOC scheduled calls Take medications as prescribed   do breathing exercises every day develop a rescue plan eliminate symptom triggers at home follow rescue plan if symptoms flare-up Continue pacing activity to avoid episodes of shortness of breath as your recuperation from recent surgery continues Continue to follow your established action plan for episodes of shortness of breath Continue using home oxygen as prescribed Use assistive devices as needed to prevent falls- your cane If you believe your condition is getting worse- contact your care providers (doctors) promptly- reaching out to your doctor early when you have concerns can prevent you from having to go to the hospital  Following is a copy of your care plan:   Goals Addressed             This Visit's Progress    [facility] Transitions of Care (TOC) Care Plan   On track    Problems:  Recent Hospitalization for treatment of surgery: (R) VATS secondary to lung CA Hospitalized September 11-15, 2025 for planned surgical (R) VATS secondary to [facility] lung CA: new to home oxygen Resides alone at baseline; independent in self care; temporarily residing with daughter post- recent surgery; uses medicaid benefit at baseline for transportation (O) unplanned hospital admissions x last (6) months; (1) x last 12 months  Goal:  Over the next 30 days, the patient will not experience hospital  readmission  Interventions:  Transitions of Care: week # 1/ day # 1 Durable Medical Equipment (DME) needs assessed with patient/caregiver Doctor Visits  - discussed the importance of doctor visits Communication with PCP re: enrollment into [facility] 30-day program Post discharge activity limitations prescribed by provider reviewed Post-op wound/incision care reviewed with patient/caregiver Reviewed Signs and symptoms of infection Provided education around common side effects of narcotic pain medicine; need to use pain medicine as prescribed/ as needed; safe use of opioid medication/ non-pharmacological strategies for post-op pain management  Confirmed uses assistive devices on regular basis, at baseline -- cane Provided education/ reinforcement around fall prevention  Reviewed multiple upcoming provider office visits: 06/27/24: PCP for hospital follow up; 07/06/24: surgical provider for hospital follow up; confirmed patient is aware of all and has plans to attend as scheduled Assessed general knowledge of benefits/ patient denies questions/ needs: verbalizes good understanding of how to contact insurance provider should questions arise in the future- uses medicaid transportation benefit at baseline: reports well versed  in use of this benefit  Successfully enrolled into 30-day TOC program- attempted to provide my direct contact information should questions/ concerns/ needs arise post-TOC call: however, patient has nothing to write with: encouraged her to check her My-Chart should she wish to contact me prior to next scheduled TOC call  COPD Interventions: Advised patient to track and manage COPD triggers Assessed social determinant of health barriers Discussed the importance of adequate rest and management of fatigue with COPD Provided instruction about proper use of medications used for management of COPD including inhalers Screening for signs  and symptoms of depression related to chronic disease  state  Use of home oxygen Provided education/ reinforcement re: purpose/ value of using Incentive Spirometer after surgery   Patient Self Care Activities:  Attend all scheduled provider appointments Call provider office for new concerns or questions  Participate in Transition of Care Program/Attend TOC scheduled calls Take medications as prescribed   do breathing exercises every day develop a rescue plan eliminate symptom triggers at home follow rescue plan if symptoms flare-up Continue pacing activity to avoid episodes of shortness of breath as your recuperation from recent surgery continues Continue to follow your established action plan for episodes of shortness of breath Continue using home oxygen as prescribed Use assistive devices as needed to prevent falls- your cane If you believe your condition is getting worse- contact your care providers (doctors) promptly- reaching out to your doctor early when you have concerns can prevent you from having to go to the hospital  Plan:  Telephone follow up appointment with care management team member scheduled for:  Wednesday, 07/04/24 at 10:00 am       Patient verbalizes understanding of instructions and care plan provided today and agrees to view in MyChart. Active MyChart status and patient understanding of how to access instructions and care plan via MyChart confirmed with patient.     If you are experiencing a Mental Health or Behavioral Health Crisis or need someone to talk to, please  call the Suicide and Crisis Lifeline: 988 call the [place]  National Suicide Prevention Lifeline: (564) 097-0556 or TTY: 972-347-5461 TTY 580 872 5780) to talk to a trained counselor call 1-800-273-TALK (toll free, 24 hour hotline) go to [facility] [address] [place] [phone]) call the [facility] Crisis Line: [phone] call 911   Beatris Blinda Lawrence, RN, BSN, CCRN Alumnus RN Care Manager   Transitions of Care  [facility] - Population Health  [place] [phone]: direct office

## 2024-06-27 ENCOUNTER — Ambulatory Visit: Admitting: Internal Medicine

## 2024-06-27 ENCOUNTER — Inpatient Hospital Stay: Admitting: Internal Medicine

## 2024-06-27 ENCOUNTER — Encounter: Payer: Self-pay | Admitting: Internal Medicine

## 2024-06-27 VITALS — BP 118/64 | HR 63 | Temp 98.0°F | Ht 61.0 in

## 2024-06-27 DIAGNOSIS — K5900 Constipation, unspecified: Secondary | ICD-10-CM | POA: Diagnosis not present

## 2024-06-27 DIAGNOSIS — R918 Other nonspecific abnormal finding of lung field: Secondary | ICD-10-CM | POA: Diagnosis not present
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
## 2024-06-27 NOTE — Assessment & Plan Note (Signed)
 Likely lung ca, pt for surgical f/u and oncology soon after oct 7

## 2024-06-27 NOTE — Assessment & Plan Note (Signed)
 Mild, for miralax 17 gm every day prn

## 2024-06-27 NOTE — Patient Instructions (Signed)
 Please take the OTC Miralax 17 gm per day to avoid worsening constipation while you are getting over the surgury  Please continue all other medications as before, and refills have been done if requested.  Please have the pharmacy call with any other refills you may need.  Please continue your efforts at being more active, low cholesterol diet, and weight control.  You are otherwise up to date with prevention measures today.  Please keep your appointments with your specialists as you may have planned - Oct 7 oncology, and sept 26 surgury  No labs needed today  Please make an Appointment to return in 6 months, or sooner if needed

## 2024-06-28 ENCOUNTER — Telehealth: Payer: Self-pay

## 2024-06-28 NOTE — Telephone Encounter (Signed)
 Copied from CRM [id number]. Topic: General - Other >> Jun 28, 2024  8:18 AM Ahlexyia S wrote: Reason for CRM: Rosaline from [facility] calling in to speak to [place]. Rosaline stated that there was some incontinence supply paperwork that was submitted but there are some corrections that need to be made.  Callback number: [phone] ext [phone]

## 2024-07-02 DIAGNOSIS — E119 Type 2 diabetes mellitus without complications: Secondary | ICD-10-CM | POA: Diagnosis not present

## 2024-07-02 NOTE — Telephone Encounter (Signed)
 Peggy Warren called in today to follow up on this and there are some missed steps on paperwork that would need to be fixed.   [phone] ext [phone]   In office Monday - Friday 8AM 5PM EST times. If would like to walk through steps.

## 2024-07-02 NOTE — Telephone Encounter (Signed)
 Patient [place] Medicaid Request for Prior Approval CMA/PA paperwork completed and faxed to [phone].

## 2024-07-03 ENCOUNTER — Telehealth: Payer: Self-pay

## 2024-07-03 DIAGNOSIS — J449 Chronic obstructive pulmonary disease, unspecified: Secondary | ICD-10-CM | POA: Diagnosis not present

## 2024-07-03 DIAGNOSIS — R3981 Functional urinary incontinence: Secondary | ICD-10-CM | POA: Diagnosis not present

## 2024-07-03 DIAGNOSIS — M1991 Primary osteoarthritis, unspecified site: Secondary | ICD-10-CM | POA: Diagnosis not present

## 2024-07-03 NOTE — Telephone Encounter (Signed)
 Copied from CRM #[id number]. Topic: Clinical - Medication Question >> Jul 03, 2024  2:17 PM [name] wrote: Reason for CRM: Pt is calling to get the Oxycodone  medication refilled. Pt stated that she has 1 pill left and is still in pain. Pt stated that she was advised to reach out to Dr.John's nurse to get this medication refilled.

## 2024-07-04 ENCOUNTER — Other Ambulatory Visit: Payer: Self-pay | Admitting: *Deleted

## 2024-07-04 NOTE — Patient Instructions (Signed)
 Visit Information  Thank you for taking time to visit with me today. Please don't hesitate to contact me if I can be of assistance to you before our next scheduled telephone appointment.  Our next appointment is by telephone on Wednesday July 11, 2024 at 10:00 am  Please call the care guide team at [phone] if you need to cancel or reschedule your appointment.   Following are the goals we discussed today:  Patient Self Care Activities:  Attend all scheduled provider appointments Call provider office for new concerns or questions  Participate in Transition of Care Program/Attend TOC scheduled calls Take medications as prescribed   do breathing exercises every day develop a rescue plan eliminate symptom triggers at home follow rescue plan if symptoms flare-up Continue pacing activity to avoid episodes of shortness of breath as your recuperation from recent surgery continues Continue to follow your established action plan for episodes of shortness of breath Continue using home oxygen as prescribed Use assistive devices as needed to prevent falls- your cane If you believe your condition is getting worse- contact your care providers (doctors) promptly- reaching out to your doctor early when you have concerns can prevent you from having to go to the hospital  If you are experiencing a Mental Health or Behavioral Health Crisis or need someone to talk to, please  call the Suicide and Crisis Lifeline: 988 call the [place]  National Suicide Prevention Lifeline: 225-257-2314 or TTY: 825-295-4248 TTY 4792567386) to talk to a trained counselor call 1-800-273-TALK (toll free, 24 hour hotline) go to [facility] [address], [place] [phone]) call the [facility] Crisis Line: [phone] call 911   Patient verbalizes understanding of instructions and care plan provided today and agrees to view in MyChart. Active MyChart status and patient  understanding of how to access instructions and care plan via MyChart confirmed with patient.     Buel Molder Mckinney Raianna Slight, RN, BSN, Media planner  Transitions of Care  [facility] - [facility] [phone]: direct office
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
## 2024-07-05 ENCOUNTER — Other Ambulatory Visit: Payer: Self-pay

## 2024-07-06 ENCOUNTER — Ambulatory Visit
Payer: Self-pay | Attending: Thoracic Surgery (Cardiothoracic Vascular Surgery) | Admitting: Thoracic Surgery (Cardiothoracic Vascular Surgery)

## 2024-07-06 VITALS — BP 124/80 | HR 75 | Resp 18 | Ht 61.0 in | Wt 189.0 lb

## 2024-07-06 DIAGNOSIS — Z9889 Other specified postprocedural states: Secondary | ICD-10-CM

## 2024-07-06 NOTE — Progress Notes (Signed)
 Attempted 6 min walk test with patient. Test stopped at 3 minutes d/t O2 saturations dropping to 87% and patient reports of SOB.

## 2024-07-06 NOTE — Progress Notes (Signed)
      [address]       [place] [address]             [phone]        Peggy Warren [facility] Medical Record #[id number] Date of Birth: Feb 14, 1966  Referring: Isadora Hose, MD Primary Care: [name], MD Primary Cardiologist:None  Reason for visit:   follow-up  History of Present Illness:     Peggy Warren presents for their first follow-up appointment.  Overall, Peggy Warren is doing well.  Peggy Warren remains on oxygen, and becomes short of breath Peggy Warren is not using it.  Physical Exam: BP 124/80 (BP Location: Left Arm)   Pulse 75   Resp 18   Ht 5' 1 (1.549 m)   Wt 189 lb (85.7 kg)   LMP 06/24/2012   SpO2 96% Comment: 2L O2  BMI 35.71 kg/m   Alert NAD Incision clean.   Abdomen, ND no peripheral edema   Diagnostic Studies & Laboratory data:  Path:  FINAL MICROSCOPIC DIAGNOSIS:  A. HILAR NODE #1, EXCISION: Lymph node negative for metastatic carcinoma (0/1).  B. LYMPH NODE, LEVEL 7, EXCISION: Lymph node negative for metastatic carcinoma (0/1).  C. LYMPH NODE, 4R, EXCISION: Lymph node negative for metastatic carcinoma (0/1).  D. 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 All surgical margins negative for carcinoma. Negative for pleural involvement. Negative for lymphovascular involvement. Two lymph node negative for metastatic carcinoma (0/2). See oncology table  Oncology table: LUNG: Resection Synchronous Tumors: Not applicable Total Number of Primary Tumors: 1 Procedure: Right upper lobectomy Specimen Laterality: Right Tumor Focality: Unifocal Tumor Site: Right upper lobe Tumor Size: 4.6 x 4 x 2.7 cm Histologic Type: Squamous cell carcinoma Visceral Pleura Invasion: Not identified Direct Invasion of Adjacent Structures: No adjacent structures present Lymphovascular Invasion: Not identified Margins: All surgical margins negative for invasive carcinoma      Closest Margin(s)  to Invasive Carcinoma: 0.4 cm Treatment Effect: No known presurgical therapy Regional Lymph Nodes:      Number of Lymph Nodes Involved: 0                           Nodal Sites with Tumor: 0      Number of Lymph Nodes Examined: 5                      Nodal Sites Examined: Peribronchial, 1, 4, 7 Distant Metastasis:      Distant Site(s) Involved: Not applicable Pathologic Stage Classification (pTNM, AJCC 8th Edition): pT2b, pN0     Assessment / Plan:   58 y.o. female s/p right upper lobectomy for T2b N0 M0 squamous cell carcinoma.  I have made a referral to medical oncology, along with a 1 month follow-up with a CXR.     Peggy Warren 07/06/2024 3:47 PM

## 2024-07-07 NOTE — Progress Notes (Signed)
 The proposed treatment discussed in conference is for discussion purpose only and is not a binding recommendation.  The patients have not been physically examined, or presented with their treatment options.  Therefore, final treatment plans cannot be decided.

## 2024-07-09 DIAGNOSIS — E119 Type 2 diabetes mellitus without complications: Secondary | ICD-10-CM | POA: Diagnosis not present

## 2024-07-09 MED ORDER — OXYCODONE HCL 5 MG PO TABS: 5.0000 mg | ORAL_TABLET | Freq: Four times a day (QID) | ORAL | 0 refills | Status: DC | PRN

## 2024-07-09 NOTE — Telephone Encounter (Signed)
 Ok for refill but please let pt know, this would need to be the final script as she is getting over the surgical procedue

## 2024-07-09 NOTE — Addendum Note (Signed)
 Addended by: [name] on: 07/09/2024 04:57 PM   Modules accepted: Orders

## 2024-07-11 ENCOUNTER — Encounter: Payer: Self-pay | Admitting: *Deleted

## 2024-07-11 ENCOUNTER — Telehealth: Payer: Self-pay | Admitting: *Deleted

## 2024-07-12 ENCOUNTER — Encounter: Payer: Self-pay | Admitting: *Deleted
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
## 2024-07-13 ENCOUNTER — Encounter (HOSPITAL_COMMUNITY): Payer: Self-pay | Admitting: *Deleted

## 2024-07-13 ENCOUNTER — Ambulatory Visit (HOSPITAL_COMMUNITY): Admission: EM | Admit: 2024-07-13 | Discharge: 2024-07-13 | Disposition: A | Discharge status: Home / Self Care

## 2024-07-13 ENCOUNTER — Other Ambulatory Visit: Admitting: *Deleted

## 2024-07-13 DIAGNOSIS — K047 Periapical abscess without sinus: Secondary | ICD-10-CM

## 2024-07-13 MED ORDER — AMOXICILLIN 500 MG PO CAPS: 500.0000 mg | ORAL_CAPSULE | Freq: Two times a day (BID) | ORAL | 0 refills | Status: AC

## 2024-07-13 NOTE — ED Triage Notes (Signed)
 Pt complains of right upper sided dental pain X 3 days. She is taking tylneol as needed. She does take pain meds scheduled but did not take any today.

## 2024-07-13 NOTE — Patient Instructions (Signed)
 Visit Information  Thank you for taking time to visit with me today. Please don't hesitate to contact me if I can be of assistance to you before our next scheduled telephone appointment.  Our next appointment is by telephone on Friday, July 20, 2024 at 10:00 am  Please call the care guide team at [phone] if you need to cancel or reschedule your appointment.   Following are the goals we discussed today:  Patient Self Care Activities:  Attend all scheduled provider appointments Call provider office for new concerns or questions  Participate in Transition of Care Program/Attend TOC scheduled calls Take medications as prescribed   do breathing exercises every day develop a rescue plan eliminate symptom triggers at home follow rescue plan if symptoms flare-up Continue pacing activity to avoid episodes of shortness of breath as your recuperation from recent surgery continues Continue to follow your established action plan for episodes of shortness of breath Continue using home oxygen as prescribed Use assistive devices as needed to prevent falls- your cane If you believe your condition is getting worse- contact your care providers (doctors) promptly- reaching out to your doctor early when you have concerns can prevent you from having to go to the hospital  If you are experiencing a Mental Health or Behavioral Health Crisis or need someone to talk to, please  call the Suicide and Crisis Lifeline: 988 call the [place]  National Suicide Prevention Lifeline: 7194224046 or TTY: 319-528-0522 TTY (719) 882-8702) to talk to a trained counselor call 1-800-273-TALK (toll free, 24 hour hotline) go to [facility] [address] [place] [phone]) call the [facility] Crisis Line: 3060587790 call 911   Patient verbalizes understanding of instructions and care plan provided today and agrees to view in MyChart. Active MyChart status and patient  understanding of how to access instructions and care plan via MyChart confirmed with patient.     Alaylah Heatherington Mckinney Clydette Privitera, RN, BSN, Media planner  Transitions of Care  [facility] [phone]: direct office

## 2024-07-13 NOTE — Discharge Instructions (Signed)
Follow up with dentist as soon as possible.
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
## 2024-07-16 ENCOUNTER — Encounter (HOSPITAL_COMMUNITY): Payer: Self-pay | Admitting: Internal Medicine

## 2024-07-16 DIAGNOSIS — E119 Type 2 diabetes mellitus without complications: Secondary | ICD-10-CM | POA: Diagnosis not present

## 2024-07-17 ENCOUNTER — Other Ambulatory Visit

## 2024-07-17 ENCOUNTER — Ambulatory Visit: Admitting: Physician Assistant

## 2024-07-17 ENCOUNTER — Encounter (HOSPITAL_COMMUNITY): Payer: Self-pay | Admitting: Internal Medicine

## 2024-07-17 ENCOUNTER — Telehealth: Payer: Self-pay

## 2024-07-17 ENCOUNTER — Inpatient Hospital Stay: Attending: Internal Medicine

## 2024-07-17 ENCOUNTER — Inpatient Hospital Stay: Admitting: Physician Assistant

## 2024-07-17 DIAGNOSIS — C3491 Malignant neoplasm of unspecified part of right bronchus or lung: Secondary | ICD-10-CM | POA: Diagnosis not present

## 2024-07-17 DIAGNOSIS — Z902 Acquired absence of lung [part of]: Secondary | ICD-10-CM | POA: Insufficient documentation

## 2024-07-17 DIAGNOSIS — Z9981 Dependence on supplemental oxygen: Secondary | ICD-10-CM | POA: Insufficient documentation

## 2024-07-17 DIAGNOSIS — C3411 Malignant neoplasm of upper lobe, right bronchus or lung: Secondary | ICD-10-CM | POA: Insufficient documentation

## 2024-07-17 DIAGNOSIS — E1165 Type 2 diabetes mellitus with hyperglycemia: Secondary | ICD-10-CM | POA: Insufficient documentation

## 2024-07-17 NOTE — Telephone Encounter (Signed)
 Spoke with patient regarding missed appointment today. Patient stated she called scheduling yesterday and left a voicemail about canceling the appointment. A message was sent to scheduling to have the patient rescheduled. Informed patient that someone will contact her with a new appointment date. Patient voiced understanding.
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
## 2024-07-20 ENCOUNTER — Other Ambulatory Visit: Payer: Self-pay | Admitting: *Deleted
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
## 2024-07-20 NOTE — Patient Instructions (Signed)
 Visit Information  Thank you for taking time to visit with me today. Please don't hesitate to contact me if I can be of assistance to you before our next scheduled telephone appointment.  Our next appointment is by telephone on Thursday July 26, 2024 at 10:45 am  Please call the care guide team at [phone] if you need to cancel or reschedule your appointment.   Following are the goals we discussed today:  Patient Self Care Activities:  Attend all scheduled provider appointments Call provider office for new concerns or questions  Participate in Transition of Care Program/Attend TOC scheduled calls Take medications as prescribed   do breathing exercises every day develop a rescue plan eliminate symptom triggers at home follow rescue plan if symptoms flare-up Continue pacing activity to avoid episodes of shortness of breath as your recuperation from recent surgery continues Continue to follow your established action plan for episodes of shortness of breath Continue using home oxygen as prescribed Use assistive devices as needed to prevent falls- your cane If you believe your condition is getting worse- contact your care providers (doctors) promptly- reaching out to your doctor early when you have concerns can prevent you from having to go to the hospital  If you are experiencing a Mental Health or Behavioral Health Crisis or need someone to talk to, please  call the Suicide and Crisis Lifeline: 988 call the [place]  National Suicide Prevention Lifeline: 8650021363 or TTY: 586 816 4276 TTY (954)457-5029) to talk to a trained counselor call 1-800-273-TALK (toll free, 24 hour hotline) go to [facility] [address], [place] [phone]) call the [facility] Crisis Line: [phone] call 911   Patient verbalizes understanding of instructions and care plan provided today and agrees to view in MyChart. Active MyChart status and patient  understanding of how to access instructions and care plan via MyChart confirmed with patient.     Traci Plemons Mckinney Bronda Alfred, RN, BSN, Media planner  Transitions of Care  [facility] - [facility] [phone]: direct office

## 2024-07-23 DIAGNOSIS — E119 Type 2 diabetes mellitus without complications: Secondary | ICD-10-CM | POA: Diagnosis not present

## 2024-07-25 ENCOUNTER — Inpatient Hospital Stay

## 2024-07-25 ENCOUNTER — Inpatient Hospital Stay (HOSPITAL_BASED_OUTPATIENT_CLINIC_OR_DEPARTMENT_OTHER): Admitting: Physician Assistant

## 2024-07-25 ENCOUNTER — Other Ambulatory Visit: Payer: Self-pay | Admitting: Medical Oncology

## 2024-07-25 VITALS — BP 128/80 | HR 73 | Temp 98.1°F | Resp 18 | Ht 61.0 in | Wt 177.1 lb

## 2024-07-25 DIAGNOSIS — R739 Hyperglycemia, unspecified: Secondary | ICD-10-CM

## 2024-07-25 DIAGNOSIS — E1165 Type 2 diabetes mellitus with hyperglycemia: Secondary | ICD-10-CM | POA: Diagnosis not present

## 2024-07-25 DIAGNOSIS — C3411 Malignant neoplasm of upper lobe, right bronchus or lung: Secondary | ICD-10-CM | POA: Diagnosis not present

## 2024-07-25 DIAGNOSIS — Z902 Acquired absence of lung [part of]: Secondary | ICD-10-CM | POA: Diagnosis not present

## 2024-07-25 DIAGNOSIS — C3490 Malignant neoplasm of unspecified part of unspecified bronchus or lung: Secondary | ICD-10-CM | POA: Diagnosis not present

## 2024-07-25 DIAGNOSIS — Z9981 Dependence on supplemental oxygen: Secondary | ICD-10-CM | POA: Diagnosis not present

## 2024-07-25 DIAGNOSIS — J449 Chronic obstructive pulmonary disease, unspecified: Secondary | ICD-10-CM | POA: Diagnosis not present

## 2024-07-25 LAB — CBC WITH DIFFERENTIAL (CANCER CENTER ONLY)
Abs Immature Granulocytes: 0.02 K/uL (ref 0.00–0.07)
Basophils Absolute: 0.1 K/uL (ref 0.0–0.1)
Basophils Relative: 1 %
Eosinophils Absolute: 0.1 K/uL (ref 0.0–0.5)
Eosinophils Relative: 1 %
HCT: 37.9 % (ref 36.0–46.0)
Hemoglobin: 13.3 g/dL (ref 12.0–15.0)
Immature Granulocytes: 0 %
Lymphocytes Relative: 29 %
Lymphs Abs: 2 K/uL (ref 0.7–4.0)
MCH: 32.4 pg (ref 26.0–34.0)
MCHC: 35.1 g/dL (ref 30.0–36.0)
MCV: 92.2 fL (ref 80.0–100.0)
Monocytes Absolute: 0.6 K/uL (ref 0.1–1.0)
Monocytes Relative: 8 %
Neutro Abs: 4.3 K/uL (ref 1.7–7.7)
Neutrophils Relative %: 61 %
Platelet Count: 315 K/uL (ref 150–400)
RBC: 4.11 MIL/uL (ref 3.87–5.11)
RDW: 11.4 % — ABNORMAL LOW (ref 11.5–15.5)
WBC Count: 7 K/uL (ref 4.0–10.5)
nRBC: 0 % (ref 0.0–0.2)

## 2024-07-25 LAB — CMP (CANCER CENTER ONLY)
ALT: 13 U/L (ref 0–44)
AST: 19 U/L (ref 15–41)
Albumin: 4.4 g/dL (ref 3.5–5.0)
Alkaline Phosphatase: 109 U/L (ref 38–126)
Anion gap: 11 (ref 5–15)
BUN: 7 mg/dL (ref 6–20)
CO2: 24 mmol/L (ref 22–32)
Calcium: 10 mg/dL (ref 8.9–10.3)
Chloride: 97 mmol/L — ABNORMAL LOW (ref 98–111)
Creatinine: 0.74 mg/dL (ref 0.44–1.00)
GFR, Estimated: >60 mL/min (ref >60)
Glucose, Bld: 560 mg/dL (ref 70–99)
Potassium: 4.1 mmol/L (ref 3.5–5.1)
Sodium: 132 mmol/L — ABNORMAL LOW (ref 135–145)
Total Bilirubin: 1.1 mg/dL (ref 0.0–1.2)
Total Protein: 8.1 g/dL (ref 6.5–8.1)

## 2024-07-25 MED ORDER — INSULIN ASPART 100 UNIT/ML IJ SOLN
10.0000 [IU] | Freq: Once | INTRAMUSCULAR | Status: AC
  Administered 2024-07-25: 10 [IU] via SUBCUTANEOUS
  Filled 2024-07-25: qty 1

## 2024-07-25 MED ORDER — ONDANSETRON HCL 8 MG PO TABS
8.0000 mg | ORAL_TABLET | Freq: Three times a day (TID) | ORAL | 1 refills | Status: AC | PRN
Start: 2024-07-25 — End: ?

## 2024-07-25 MED ORDER — PROCHLORPERAZINE MALEATE 10 MG PO TABS: 10.0000 mg | ORAL_TABLET | Freq: Four times a day (QID) | ORAL | 2 refills | Status: AC | PRN

## 2024-07-25 MED ORDER — PROCHLORPERAZINE MALEATE 10 MG PO TABS: 10.0000 mg | ORAL_TABLET | Freq: Four times a day (QID) | ORAL | 1 refills | Status: DC | PRN

## 2024-07-25 NOTE — Progress Notes (Signed)
 Insulin  ordered by Cassie.

## 2024-07-25 NOTE — Progress Notes (Signed)
 CRITICAL VALUE STICKER  CRITICAL VALUE: Glucose 560  RECEIVER (on-site recipient of call): Andrea Plunk, RN  DATE & TIME NOTIFIED: 07/25/2024 1515  MESSENGER (representative from lab): Pam  MD NOTIFIED: Charlott, NP  TIME OF NOTIFICATION: 1535  RESPONSE:

## 2024-07-25 NOTE — Progress Notes (Signed)
 START ON PATHWAY REGIMEN - Non-Small Cell Lung     A cycle is every 21 days:     Paclitaxel      Carboplatin   **Always confirm dose/schedule in your pharmacy ordering system**  Patient Characteristics: Postoperative without Neoadjuvant Therapy (Pathologic Staging), Stage IB (Tumor Size = 4 cm) or Stage II / III, Adjuvant Chemotherapy Planned, Stage IB (Tumor Size = 4 cm), or Stage II / III, Squamous Cell Therapeutic Status: Postoperative without Neoadjuvant Therapy (Pathologic Staging) AJCC M Category: cM0 AJCC 9 Stage Grouping: IIA AJCC N Category: pN0 AJCC T Category: pT2b Check here if patient was staged using an edition other than AJCC Staging 9th Edition: false Adjuvant Chemotherapy Status: Adjuvant Chemotherapy Planned Histology: Squamous Cell Intent of Therapy: Curative Intent, Discussed with Patient

## 2024-07-25 NOTE — Patient Instructions (Addendum)
 Since the tumor was 4.6 cm, we recommend 4 cycles of chemotherapy to reduce the risk of recurrence of the cancer.  -There are two main categories of lung cancer, they are named based on the size of the cancer cell. One is called Non-Small cell lung cancer. 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  -We covered a lot of important information at your appointment today regarding what the treatment plan is moving forward. Here are the the main points that were discussed at your office visit with us  today:  -The treatment that you will receive consists of two chemotherapy drugs, called Carboplatin and Paclitaxel (also referred to as Taxol).  -We are planning on starting your treatment next week on 08/08/24 but before your start your treatment, I would like you to attend a Chemotherapy Education Class. This involves having you sit down with one of our nurse educators. She will discuss with you one-on-one more details about your treatment as well as general information about resources here at the cancer center.  -Your treatment will be given once every 3 weeks for a total of 4 times. Therefore, this will be 12 weeks total.  -You will need to return 2 days after your treatment to receive an injection. This injection is important because it boosts your infection fighting cells in your body and helps protect you from getting an infection.  This can make people achy so I recommend you pick up Claritin once daily for 4-7 days.  -We will get a CT scan after 3 treatments to check on the progress of treatment  Medications:  -Compazine was sent to your pharmacy. This medication is for nausea. You may take this every 6 hours as needed if you feel nausous.   Referrals or Imaging:   Follow up:  -We will see you back for a follow up visit in about  2 weeks with the first treatment  If you need to contact our office,  please do not hesitate, we are here to help. Our number is [phone]. When you call, ask to speak to Cassie's or Dr. Jeannett nurse.

## 2024-07-26 ENCOUNTER — Other Ambulatory Visit: Payer: Self-pay | Admitting: *Deleted

## 2024-07-26 ENCOUNTER — Other Ambulatory Visit: Payer: Self-pay

## 2024-07-26 DIAGNOSIS — R918 Other nonspecific abnormal finding of lung field: Secondary | ICD-10-CM

## 2024-07-26 DIAGNOSIS — J438 Other emphysema: Secondary | ICD-10-CM

## 2024-07-26 NOTE — Patient Instructions (Signed)
 Visit Information  Thank you for taking time to visit with me today. Please don't hesitate to contact me if I can be of assistance to you before our next scheduled telephone appointment.  It has been a pleasure working with you over the last 30 days!  Please listen for a call from the scheduling care guide to schedule a phone call with the new nurse care manager who will pick up in your care where we are leaving off today  Following are the goals we discussed today:  Patient Self Care Activities:  Attend all scheduled provider appointments Call provider office for new concerns or questions  Take medications as prescribed   do breathing exercises every day develop a rescue plan eliminate symptom triggers at home follow rescue plan if symptoms flare-up Continue pacing activity to avoid episodes of shortness of breath as your recuperation from recent surgery continues Continue to follow your established action plan for episodes of shortness of breath Continue using home oxygen as prescribed Use assistive devices as needed to prevent falls- your cane If you believe your condition is getting worse- contact your care providers (doctors) promptly- reaching out to your doctor early when you have concerns can prevent you from having to go to the hospital  If you are experiencing a Mental Health or Behavioral Health Crisis or need someone to talk to, please  call the Suicide and Crisis Lifeline: 988 call the [place]  National Suicide Prevention Lifeline: 774-516-2275 or TTY: (606)849-4294 TTY 929-326-7477) to talk to a trained counselor call 1-800-273-TALK (toll free, 24 hour hotline) go to [facility] [address] [place] [phone]) call the [facility] Crisis Line: [phone] call 911   Patient verbalizes understanding of instructions and care plan provided today and agrees to view in MyChart. Active MyChart status and patient understanding of  how to access instructions and care plan via MyChart confirmed with patient.     Brooklin Rieger Mckinney Donevan Biller, RN, BSN, Media planner  Transitions of Care  [facility] - [facility] [phone]: direct office
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
## 2024-07-27 ENCOUNTER — Telehealth: Payer: Self-pay | Admitting: *Deleted

## 2024-07-27 NOTE — Progress Notes (Unsigned)
 Complex Care Management Note Care Guide Note  07/27/2024 Name: Peggy Warren MRN: [id number] DOB: July 25, 1966   Complex Care Management Outreach Attempts: An unsuccessful telephone outreach was attempted today to offer the patient information about available complex care management services.  Follow Up Plan:  Additional outreach attempts will be made to offer the patient complex care management information and services.   Encounter Outcome:  No Answer  Thedford Franks, CMA [place]  [facility], [facility] Guide Direct Dial: [phone]  Fax: [phone] Website: [email or web address]

## 2024-07-30 NOTE — Progress Notes (Signed)
 Complex Care Management Note  Care Guide Note 07/30/2024 Name: SORAYA PAQUETTE MRN: [id number] DOB: [birth year]  FARIDA MCREYNOLDS is a 58 y.o. year old female who sees [name], MD for primary care. I reached out to [name] by phone today to offer complex care management services.  Ms. Conant was given information about Complex Care Management services today including:   The Complex Care Management services include support from the care team which includes your Nurse Care Manager, Clinical Social Worker, or Pharmacist.  The Complex Care Management team is here to help remove barriers to the health concerns and goals most important to you. Complex Care Management services are voluntary, and the patient may decline or stop services at any time by request to their care team member.   Complex Care Management Consent Status: Patient agreed to services and verbal consent obtained.   Follow up plan:  Telephone appointment with complex care management team member scheduled for:  08/15/2024  Encounter Outcome:  Patient Scheduled  Thedford Franks, CMA\ [place]  [facility], [facility] Guide Direct Dial: [phone]  Fax: [phone] Website: [email or web address]

## 2024-07-31 DIAGNOSIS — E119 Type 2 diabetes mellitus without complications: Secondary | ICD-10-CM | POA: Diagnosis not present

## 2024-08-02 ENCOUNTER — Other Ambulatory Visit: Payer: Self-pay | Admitting: Thoracic Surgery (Cardiothoracic Vascular Surgery)

## 2024-08-02 DIAGNOSIS — R918 Other nonspecific abnormal finding of lung field: Secondary | ICD-10-CM

## 2024-08-03 ENCOUNTER — Ambulatory Visit: Admission: RE | Admit: 2024-08-03

## 2024-08-03 ENCOUNTER — Ambulatory Visit

## 2024-08-03 NOTE — Progress Notes (Deleted)
      [address] [place] [address]             [phone]       HPI:  Patient returns for routine postoperative follow-up having undergone *** on ***. The patient's early postoperative recovery while in the hospital was notable for ***. Since hospital discharge the patient reports ***.  Allergies as of 08/03/2024   No Known Allergies      Medication List        Accurate as of August 03, 2024  9:38 AM. If you have any questions, ask your nurse or doctor.          acetaminophen  500 MG tablet Commonly known as: TYLENOL  Take 500-1,000 mg by mouth every 6 (six) hours as needed (pain.).   albuterol  108 (90 Base) MCG/ACT inhaler Commonly known as: VENTOLIN  HFA Inhale 1-2 puffs into the lungs every 6 (six) hours as needed for wheezing or shortness of breath.   amLODipine  10 MG tablet Commonly known as: NORVASC  Take 1 tablet (10 mg total) by mouth daily.   aspirin  EC 81 MG tablet Take 1 tablet (81 mg total) by mouth daily. Swallow whole.   atorvastatin  40 MG tablet Commonly known as: LIPITOR Take 1 tablet (40 mg total) by mouth daily.   Blood Glucose Monitoring Suppl Devi 1 each by Does not apply route in the morning, at noon, and at bedtime. May substitute to any manufacturer covered by patient's insurance.   Dexcom G7 Receiver Devi Use as directed four times per day E11.9   Dexcom G7 Sensor Misc Use as directed topically one every 10 days E11.9   lisinopril  5 MG tablet Commonly known as: ZESTRIL  Take 1 tablet (5 mg total) by mouth daily.   methocarbamol  500 MG tablet Commonly known as: ROBAXIN  Take 1 tablet (500 mg total) by mouth every 8 (eight) hours as needed for muscle spasms.   multivitamin tablet Take 1 tablet by mouth in the morning.   ondansetron  8 MG tablet Commonly known as: Zofran  Take 1 tablet (8 mg total) by mouth every 8 (eight) hours as needed for nausea or vomiting. Start on the third day after carboplatin.    oxyCODONE  5 MG immediate release tablet Commonly known as: Roxicodone  Take 1 tablet (5 mg total) by mouth every 6 (six) hours as needed for severe pain (pain score 7-10).   Ozempic  (0.25 or 0.5 MG/DOSE) 2 MG/3ML Sopn Generic drug: Semaglutide (0.25 or 0.5MG /DOS) Take 0.25 mg subcutaneous once weekly E11.9   prochlorperazine 10 MG tablet Commonly known as: COMPAZINE Take 1 tablet (10 mg total) by mouth every 6 (six) hours as needed.   prochlorperazine 10 MG tablet Commonly known as: COMPAZINE Take 1 tablet (10 mg total) by mouth every 6 (six) hours as needed for nausea or vomiting.   umeclidinium-vilanterol 62.5-25 MCG/ACT Aepb Commonly known as: Anoro Ellipta  Inhale 1 puff into the lungs daily.   varenicline  1 MG tablet Commonly known as: Chantix  Continuing Month Pak Take 1 tablet (1 mg total) by mouth 2 (two) times daily.         ROS    LMP 06/24/2012      Imaging:    Assessment/Plan:    [place]uel[place] CHRISTELLA Rough, PA-C 9:38 AM 08/03/24

## 2024-08-06 DIAGNOSIS — E119 Type 2 diabetes mellitus without complications: Secondary | ICD-10-CM | POA: Diagnosis not present

## 2024-08-10 ENCOUNTER — Ambulatory Visit
Admission: RE | Admit: 2024-08-10 | Discharge: 2024-08-10 | Disposition: A | Discharge status: Home / Self Care | Source: Ambulatory Visit | Attending: Internal Medicine | Admitting: Internal Medicine

## 2024-08-10 ENCOUNTER — Other Ambulatory Visit: Payer: Self-pay

## 2024-08-10 ENCOUNTER — Encounter (HOSPITAL_COMMUNITY): Payer: Self-pay | Admitting: Emergency Medicine

## 2024-08-10 ENCOUNTER — Ambulatory Visit (INDEPENDENT_AMBULATORY_CARE_PROVIDER_SITE_OTHER)

## 2024-08-10 ENCOUNTER — Ambulatory Visit: Payer: Self-pay

## 2024-08-10 ENCOUNTER — Inpatient Hospital Stay (HOSPITAL_COMMUNITY)
Admission: EM | Admit: 2024-08-10 | Discharge: 2024-08-14 | DRG: 638 | Disposition: A | Discharge status: Home / Self Care | Attending: Student | Admitting: Student

## 2024-08-10 VITALS — BP 126/83 | HR 73 | Resp 18 | Ht 61.0 in | Wt 174.0 lb

## 2024-08-10 DIAGNOSIS — C3411 Malignant neoplasm of upper lobe, right bronchus or lung: Secondary | ICD-10-CM | POA: Diagnosis present

## 2024-08-10 DIAGNOSIS — E1110 Type 2 diabetes mellitus with ketoacidosis without coma: Principal | ICD-10-CM | POA: Diagnosis present

## 2024-08-10 DIAGNOSIS — H00014 Hordeolum externum left upper eyelid: Secondary | ICD-10-CM | POA: Diagnosis not present

## 2024-08-10 DIAGNOSIS — Z8673 Personal history of transient ischemic attack (TIA), and cerebral infarction without residual deficits: Secondary | ICD-10-CM

## 2024-08-10 DIAGNOSIS — Z823 Family history of stroke: Secondary | ICD-10-CM | POA: Diagnosis not present

## 2024-08-10 DIAGNOSIS — I10 Essential (primary) hypertension: Secondary | ICD-10-CM | POA: Diagnosis not present

## 2024-08-10 DIAGNOSIS — Z7985 Long-term (current) use of injectable non-insulin antidiabetic drugs: Secondary | ICD-10-CM

## 2024-08-10 DIAGNOSIS — F17210 Nicotine dependence, cigarettes, uncomplicated: Secondary | ICD-10-CM | POA: Diagnosis not present

## 2024-08-10 DIAGNOSIS — R918 Other nonspecific abnormal finding of lung field: Secondary | ICD-10-CM | POA: Insufficient documentation

## 2024-08-10 DIAGNOSIS — E876 Hypokalemia: Secondary | ICD-10-CM | POA: Diagnosis not present

## 2024-08-10 DIAGNOSIS — Z6832 Body mass index (BMI) 32.0-32.9, adult: Secondary | ICD-10-CM

## 2024-08-10 DIAGNOSIS — Z79899 Other long term (current) drug therapy: Secondary | ICD-10-CM | POA: Diagnosis not present

## 2024-08-10 DIAGNOSIS — E1010 Type 1 diabetes mellitus with ketoacidosis without coma: Principal | ICD-10-CM

## 2024-08-10 DIAGNOSIS — Z7984 Long term (current) use of oral hypoglycemic drugs: Secondary | ICD-10-CM

## 2024-08-10 DIAGNOSIS — Z7951 Long term (current) use of inhaled steroids: Secondary | ICD-10-CM | POA: Diagnosis not present

## 2024-08-10 DIAGNOSIS — Z9889 Other specified postprocedural states: Secondary | ICD-10-CM | POA: Insufficient documentation

## 2024-08-10 DIAGNOSIS — Z85118 Personal history of other malignant neoplasm of bronchus and lung: Secondary | ICD-10-CM

## 2024-08-10 DIAGNOSIS — Z902 Acquired absence of lung [part of]: Secondary | ICD-10-CM | POA: Diagnosis not present

## 2024-08-10 DIAGNOSIS — J449 Chronic obstructive pulmonary disease, unspecified: Secondary | ICD-10-CM | POA: Diagnosis present

## 2024-08-10 DIAGNOSIS — Z7982 Long term (current) use of aspirin: Secondary | ICD-10-CM | POA: Diagnosis not present

## 2024-08-10 DIAGNOSIS — Z794 Long term (current) use of insulin: Secondary | ICD-10-CM

## 2024-08-10 DIAGNOSIS — K219 Gastro-esophageal reflux disease without esophagitis: Secondary | ICD-10-CM | POA: Diagnosis present

## 2024-08-10 DIAGNOSIS — E1100 Type 2 diabetes mellitus with hyperosmolarity without nonketotic hyperglycemic-hyperosmolar coma (NKHHC): Secondary | ICD-10-CM

## 2024-08-10 DIAGNOSIS — Z8249 Family history of ischemic heart disease and other diseases of the circulatory system: Secondary | ICD-10-CM | POA: Diagnosis not present

## 2024-08-10 DIAGNOSIS — I2510 Atherosclerotic heart disease of native coronary artery without angina pectoris: Secondary | ICD-10-CM | POA: Diagnosis present

## 2024-08-10 DIAGNOSIS — E66811 Obesity, class 1: Secondary | ICD-10-CM | POA: Diagnosis not present

## 2024-08-10 LAB — BASIC METABOLIC PANEL WITH GFR
Anion gap: 21 — ABNORMAL HIGH (ref 5–15)
Anion gap: 12 (ref 5–15)
Anion gap: 13 (ref 5–15)
BUN: 6 mg/dL (ref 6–20)
BUN: <5 mg/dL — ABNORMAL LOW (ref 6–20)
BUN: <5 mg/dL — ABNORMAL LOW (ref 6–20)
CO2: 15 mmol/L — ABNORMAL LOW (ref 22–32)
CO2: 20 mmol/L — ABNORMAL LOW (ref 22–32)
CO2: 19 mmol/L — ABNORMAL LOW (ref 22–32)
Calcium: 10 mg/dL (ref 8.9–10.3)
Calcium: 8.9 mg/dL (ref 8.9–10.3)
Calcium: 8.8 mg/dL — ABNORMAL LOW (ref 8.9–10.3)
Chloride: 95 mmol/L — ABNORMAL LOW (ref 98–111)
Chloride: 103 mmol/L (ref 98–111)
Chloride: 101 mmol/L (ref 98–111)
Creatinine, Ser: 0.84 mg/dL (ref 0.44–1.00)
Creatinine, Ser: 0.57 mg/dL (ref 0.44–1.00)
Creatinine, Ser: 0.5 mg/dL (ref 0.44–1.00)
GFR, Estimated: >60 mL/min (ref >60)
GFR, Estimated: >60 mL/min (ref >60)
GFR, Estimated: >60 mL/min (ref >60)
Glucose, Bld: 435 mg/dL — ABNORMAL HIGH (ref 70–99)
Glucose, Bld: 193 mg/dL — ABNORMAL HIGH (ref 70–99)
Glucose, Bld: 219 mg/dL — ABNORMAL HIGH (ref 70–99)
Potassium: 3.9 mmol/L (ref 3.5–5.1)
Potassium: 3.2 mmol/L — ABNORMAL LOW (ref 3.5–5.1)
Potassium: 4.6 mmol/L (ref 3.5–5.1)
Sodium: 131 mmol/L — ABNORMAL LOW (ref 135–145)
Sodium: 135 mmol/L (ref 135–145)
Sodium: 133 mmol/L — ABNORMAL LOW (ref 135–145)

## 2024-08-10 LAB — URINALYSIS, ROUTINE W REFLEX MICROSCOPIC
Bacteria, UA: NONE SEEN
Bilirubin Urine: NEGATIVE
Glucose, UA: >=500 mg/dL — AB
Ketones, ur: 80 mg/dL — AB
Leukocytes,Ua: NEGATIVE
Nitrite: NEGATIVE
Protein, ur: 30 mg/dL — AB
Specific Gravity, Urine: 1.029 (ref 1.005–1.030)
pH: 5 (ref 5.0–8.0)

## 2024-08-10 LAB — CBG MONITORING, ED
Glucose-Capillary: 381 mg/dL — ABNORMAL HIGH (ref 70–99)
Glucose-Capillary: 332 mg/dL — ABNORMAL HIGH (ref 70–99)
Glucose-Capillary: 323 mg/dL — ABNORMAL HIGH (ref 70–99)

## 2024-08-10 LAB — I-STAT CHEM 8, ED
BUN: 5 mg/dL — ABNORMAL LOW (ref 6–20)
Calcium, Ion: 1.22 mmol/L (ref 1.15–1.40)
Chloride: 99 mmol/L (ref 98–111)
Creatinine, Ser: 0.5 mg/dL (ref 0.44–1.00)
Glucose, Bld: 442 mg/dL — ABNORMAL HIGH (ref 70–99)
HCT: 45 % (ref 36.0–46.0)
Hemoglobin: 15.3 g/dL — ABNORMAL HIGH (ref 12.0–15.0)
Potassium: 3.8 mmol/L (ref 3.5–5.1)
Sodium: 130 mmol/L — ABNORMAL LOW (ref 135–145)
TCO2: 16 mmol/L — ABNORMAL LOW (ref 22–32)

## 2024-08-10 LAB — CBC
HCT: 42.5 % (ref 36.0–46.0)
Hemoglobin: 14 g/dL (ref 12.0–15.0)
MCH: 31.4 pg (ref 26.0–34.0)
MCHC: 32.9 g/dL (ref 30.0–36.0)
MCV: 95.3 fL (ref 80.0–100.0)
Platelets: 392 K/uL (ref 150–400)
RBC: 4.46 MIL/uL (ref 3.87–5.11)
RDW: 12.4 % (ref 11.5–15.5)
WBC: 7.9 K/uL (ref 4.0–10.5)
nRBC: 0 % (ref 0.0–0.2)

## 2024-08-10 LAB — HIV ANTIBODY (ROUTINE TESTING W REFLEX): HIV Screen 4th Generation wRfx: NONREACTIVE

## 2024-08-10 LAB — BETA-HYDROXYBUTYRIC ACID
Beta-Hydroxybutyric Acid: 4.66 mmol/L — ABNORMAL HIGH (ref 0.05–0.27)
Beta-Hydroxybutyric Acid: 4.48 mmol/L — ABNORMAL HIGH (ref 0.05–0.27)
Beta-Hydroxybutyric Acid: 2.28 mmol/L — ABNORMAL HIGH (ref 0.05–0.27)

## 2024-08-10 LAB — I-STAT VENOUS BLOOD GAS, ED
Acid-base deficit: 6 mmol/L — ABNORMAL HIGH (ref 0.0–2.0)
Bicarbonate: 18.2 mmol/L — ABNORMAL LOW (ref 20.0–28.0)
Calcium, Ion: 1.33 mmol/L (ref 1.15–1.40)
HCT: 45 % (ref 36.0–46.0)
Hemoglobin: 15.3 g/dL — ABNORMAL HIGH (ref 12.0–15.0)
O2 Saturation: 99 %
Potassium: 3.8 mmol/L (ref 3.5–5.1)
Sodium: 132 mmol/L — ABNORMAL LOW (ref 135–145)
TCO2: 19 mmol/L — ABNORMAL LOW (ref 22–32)
pCO2, Ven: 31.9 mmHg — ABNORMAL LOW (ref 44–60)
pH, Ven: 7.365 (ref 7.25–7.43)
pO2, Ven: 164 mmHg — ABNORMAL HIGH (ref 32–45)

## 2024-08-10 LAB — GLUCOSE, CAPILLARY
Glucose-Capillary: 244 mg/dL — ABNORMAL HIGH (ref 70–99)
Glucose-Capillary: 214 mg/dL — ABNORMAL HIGH (ref 70–99)
Glucose-Capillary: 230 mg/dL — ABNORMAL HIGH (ref 70–99)
Glucose-Capillary: 186 mg/dL — ABNORMAL HIGH (ref 70–99)
Glucose-Capillary: 201 mg/dL — ABNORMAL HIGH (ref 70–99)
Glucose-Capillary: 278 mg/dL — ABNORMAL HIGH (ref 70–99)

## 2024-08-10 MED ORDER — LACTATED RINGERS IV SOLN: INTRAVENOUS | Status: AC

## 2024-08-10 MED ORDER — LACTATED RINGERS IV BOLUS
20.0000 mL/kg | Freq: Once | INTRAVENOUS | Status: AC
  Administered 2024-08-10: 1578 mL via INTRAVENOUS

## 2024-08-10 MED ORDER — SODIUM CHLORIDE 0.9 % IV BOLUS
1000.0000 mL | Freq: Once | INTRAVENOUS | Status: AC
  Administered 2024-08-10: 1000 mL via INTRAVENOUS

## 2024-08-10 MED ORDER — LISINOPRIL 5 MG PO TABS
5.0000 mg | ORAL_TABLET | Freq: Every day | ORAL | Status: DC
  Filled 2024-08-10: qty 1

## 2024-08-10 MED ORDER — LISINOPRIL 20 MG PO TABS
20.0000 mg | ORAL_TABLET | Freq: Every day | ORAL | Status: DC
  Administered 2024-08-11 – 2024-08-14 (×4): 20 mg via ORAL
  Filled 2024-08-10 (×4): qty 1

## 2024-08-10 MED ORDER — POTASSIUM CHLORIDE 20 MEQ PO PACK
40.0000 meq | PACK | Freq: Two times a day (BID) | ORAL | Status: AC
  Administered 2024-08-10 – 2024-08-11 (×2): 40 meq via ORAL
  Filled 2024-08-10 (×2): qty 2

## 2024-08-10 MED ORDER — DEXTROSE 50 % IV SOLN: 0.0000 mL | INTRAVENOUS | Status: DC | PRN

## 2024-08-10 MED ORDER — POTASSIUM CHLORIDE 10 MEQ/100ML IV SOLN
10.0000 meq | INTRAVENOUS | Status: AC
  Administered 2024-08-10 (×2): 10 meq via INTRAVENOUS
  Filled 2024-08-10 (×2): qty 100

## 2024-08-10 MED ORDER — ATORVASTATIN CALCIUM 40 MG PO TABS
40.0000 mg | ORAL_TABLET | Freq: Every day | ORAL | Status: DC
  Administered 2024-08-10 – 2024-08-14 (×5): 40 mg via ORAL
  Filled 2024-08-10 (×5): qty 1

## 2024-08-10 MED ORDER — INSULIN ASPART 100 UNIT/ML IJ SOLN: 10.0000 [IU] | Freq: Once | INTRAMUSCULAR | Status: DC

## 2024-08-10 MED ORDER — LISINOPRIL 5 MG PO TABS
15.0000 mg | ORAL_TABLET | Freq: Once | ORAL | Status: AC
  Administered 2024-08-10: 15 mg via ORAL
  Filled 2024-08-10: qty 1

## 2024-08-10 MED ORDER — IPRATROPIUM-ALBUTEROL 0.5-2.5 (3) MG/3ML IN SOLN: 3.0000 mL | Freq: Four times a day (QID) | RESPIRATORY_TRACT | Status: DC | PRN

## 2024-08-10 MED ORDER — INSULIN REGULAR(HUMAN) IN NACL 100-0.9 UT/100ML-% IV SOLN
INTRAVENOUS | Status: DC
  Administered 2024-08-10: 11 [IU]/h via INTRAVENOUS
  Filled 2024-08-10 (×2): qty 100

## 2024-08-10 MED ORDER — DEXTROSE IN LACTATED RINGERS 5 % IV SOLN: INTRAVENOUS | Status: DC

## 2024-08-10 MED ORDER — ASPIRIN 81 MG PO TBEC
81.0000 mg | DELAYED_RELEASE_TABLET | Freq: Every day | ORAL | Status: DC
  Administered 2024-08-10 – 2024-08-14 (×5): 81 mg via ORAL
  Filled 2024-08-10 (×5): qty 1

## 2024-08-10 MED ORDER — FLUCONAZOLE 150 MG PO TABS
150.0000 mg | ORAL_TABLET | Freq: Once | ORAL | Status: AC
  Administered 2024-08-10: 150 mg via ORAL
  Filled 2024-08-10: qty 1

## 2024-08-10 MED ORDER — UMECLIDINIUM-VILANTEROL 62.5-25 MCG/ACT IN AEPB
1.0000 | INHALATION_SPRAY | Freq: Every day | RESPIRATORY_TRACT | Status: DC
  Administered 2024-08-11 – 2024-08-14 (×4): 1 via RESPIRATORY_TRACT
  Filled 2024-08-10: qty 14

## 2024-08-10 MED ORDER — AMLODIPINE BESYLATE 10 MG PO TABS
10.0000 mg | ORAL_TABLET | Freq: Every day | ORAL | Status: DC
  Administered 2024-08-10 – 2024-08-14 (×5): 10 mg via ORAL
  Filled 2024-08-10 (×5): qty 1

## 2024-08-10 NOTE — ED Triage Notes (Signed)
 Pt glucose was in 500's a couple of weeks ago.  PT misplaced glucometer on 10/18.  PT has been dizzy, nauseous, thirsty, freq urination and fatigue. Pt was on Ozempic  but hasn't taken in about weeks. She still has not much appetite. PT had lung surgery on 9/11 and was following up with surgeon today who suggested she come be evaluated for hyperglycemia.

## 2024-08-10 NOTE — ED Notes (Signed)
 Extra DG & Blue top drawn

## 2024-08-10 NOTE — Telephone Encounter (Signed)
 FYI Only or Action Required?: FYI only for provider: ED advised.  Patient was last seen in primary care on 06/27/2024 by [name], MD.  Called Nurse Triage reporting Hyperglycemia.  Symptoms began today.  Interventions attempted: Nothing.  Symptoms are: gradually worsening.  Triage Disposition: Go to ED Now (or PCP Triage)  Patient/caregiver understands and will follow disposition?: Yes     Copied from CRM [id number]. Topic: Clinical - Red Word Triage >> Aug 10, 2024 11:36 AM Rea ORN wrote: Red Word that prompted transfer to Nurse Triage: pt daughter [name] stated the pt is another appt with Morna Rough. The dont have a BG monitor but believe the pt's BG is high. Pt is sluggish and weak and they stated if she can not be seen in clinic today then pt needs to go to ED.       Reason for Disposition  Patient sounds very sick or weak to the triager  Answer Assessment - Initial Assessment Questions Patient at another appointment and is experiencing symptoms of high blood sugar and is unable to check blood sugar and has no insulin  at home. Patient advised to go to the ED for evaluation and treatment, which she is agreeable with.       1. BLOOD GLUCOSE: What is your blood glucose level?      Unable to check  2. ONSET: When did you check the blood glucose?     Today 3. USUAL RANGE: What is your glucose level usually? (e.g., usual fasting morning value, usual evening value)     N/A 4. KETONES: Do you check for ketones (urine or blood test strips)? If Yes, ask: What does the test show now?      Unable to check  5. TYPE 1 or 2:  Do you know what type of diabetes you have?  (e.g., Type 1, Type 2, Gestational; doesn't know)      Type 2  6. INSULIN : Do you take insulin ? What type of insulin (s) do you use? What is the mode of delivery? (syringe, pen; injection or pump)?      Out of insulin   7. DIABETES PILLS: Do you take any pills for your diabetes? If Yes,  ask: Have you missed taking any pills recently?     No 8. OTHER SYMPTOMS: Do you have any symptoms? (e.g., fever, frequent urination, difficulty breathing, dizziness, weakness, vomiting)     Fatigue, nausea, loss of appetite, fatigue/weakness  Protocols used: Diabetes - High Blood Sugar-A-AH

## 2024-08-10 NOTE — Progress Notes (Signed)
 Potassium of 3.2 reported to provider on-call, Dr. JAYSON Hurst. Orders received to replete and to hold insulin  gtt until repletion of potassium is complete.

## 2024-08-10 NOTE — ED Notes (Signed)
 Called CCMD at [phone] to initiate cardiac monitoring as ordered.

## 2024-08-10 NOTE — ED Provider Notes (Signed)
 [place] EMERGENCY DEPARTMENT AT [facility] Provider Note   CSN: [id number] Arrival date & time: 08/10/24  1237     Patient presents with: No chief complaint on file.   Peggy Warren is a 58 y.o. female.   HPI Female arrives with her daughter who assists with the history. Patient presents with fatigue, polyuria, polydipsia.  She notes that she did run out of her glucometer, but has been taking medication as directed until she ran out of that as well. Patient's history is notable for recent lung surgery, with postop visit earlier today.  At the postop visit she described her complaints and was sent here for evaluation. Per report point-of-care glucose greater than 500.    Prior to Admission medications   Medication Sig Start Date End Date Taking? Authorizing Provider  acetaminophen  (TYLENOL ) 500 MG tablet Take 500-1,000 mg by mouth every 6 (six) hours as needed (pain.).    [provider]  albuterol  (VENTOLIN  HFA) 108 (90 Base) MCG/ACT inhaler Inhale 1-2 puffs into the lungs every 6 (six) hours as needed for wheezing or shortness of breath.    [provider]  amLODipine  (NORVASC ) 10 MG tablet Take 1 tablet (10 mg total) by mouth daily. 05/09/24   [name], MD  aspirin  EC 81 MG tablet Take 1 tablet (81 mg total) by mouth daily. Swallow whole. 07/30/23   Gonfa, Taye T, MD  atorvastatin  (LIPITOR) 40 MG tablet Take 1 tablet (40 mg total) by mouth daily. 05/09/24   [name], MD  Blood Glucose Monitoring Suppl DEVI 1 each by Does not apply route in the morning, at noon, and at bedtime. May substitute to any manufacturer covered by patient's insurance. 06/18/24   [name], MD  Continuous Glucose Receiver (DEXCOM G7 RECEIVER) DEVI Use as directed four times per day E11.9 05/22/24   [name], MD  Continuous Glucose Sensor (DEXCOM G7 SENSOR) MISC Use as directed topically one every 10 days E11.9 05/22/24   [name], MD  lisinopril  (ZESTRIL ) 5  MG tablet Take 1 tablet (5 mg total) by mouth daily. 06/25/24   Roddenberry, Myron G, PA-C  methocarbamol  (ROBAXIN ) 500 MG tablet Take 1 tablet (500 mg total) by mouth every 8 (eight) hours as needed for muscle spasms. 06/07/24   [name], MD  Multiple Vitamin (MULTIVITAMIN) tablet Take 1 tablet by mouth in the morning.    [provider]  ondansetron  (ZOFRAN ) 8 MG tablet Take 1 tablet (8 mg total) by mouth every 8 (eight) hours as needed for nausea or vomiting. Start on the third day after carboplatin. 07/25/24   Sherrod Sherrod, MD  oxyCODONE  (ROXICODONE ) 5 MG immediate release tablet Take 1 tablet (5 mg total) by mouth every 6 (six) hours as needed for severe pain (pain score 7-10). 07/09/24   [name], MD  prochlorperazine (COMPAZINE) 10 MG tablet Take 1 tablet (10 mg total) by mouth every 6 (six) hours as needed. 07/25/24   Heilingoetter, Cassandra L, PA-C  prochlorperazine (COMPAZINE) 10 MG tablet Take 1 tablet (10 mg total) by mouth every 6 (six) hours as needed for nausea or vomiting. 07/25/24   Sherrod Sherrod, MD  Semaglutide ,0.25 or 0.5MG /DOS, (OZEMPIC , 0.25 OR 0.5 MG/DOSE,) 2 MG/3ML SOPN Take 0.25 mg subcutaneous once weekly E11.9 05/22/24   [name], MD  umeclidinium-vilanterol (ANORO ELLIPTA ) 62.5-25 MCG/ACT AEPB Inhale 1 puff into the lungs daily. 05/04/24   Isadora Hose, MD  varenicline  (CHANTIX   CONTINUING MONTH PAK) 1 MG tablet Take 1 tablet (1 mg total) by mouth 2 (two) times daily. 05/09/24   [name], MD    Allergies: Patient has no known allergies.    Review of Systems  Updated Vital Signs BP (!) 148/78   Pulse 66   Temp 98.6 F (37 C) (Oral)   Resp 17   LMP 06/24/2012   SpO2 100%   Physical Exam Vitals and nursing note reviewed.  Constitutional:      General: She is not in acute distress.    Appearance: She is well-developed.  HENT:     Head: Normocephalic and atraumatic.  Eyes:     Conjunctiva/sclera: Conjunctivae normal.   Cardiovascular:     Rate and Rhythm: Regular rhythm. Tachycardia present.  Pulmonary:     Effort: Pulmonary effort is normal. No respiratory distress.     Breath sounds: Normal breath sounds. No stridor.  Abdominal:     General: There is no distension.  Skin:    General: Skin is warm and dry.  Neurological:     Mental Status: She is alert and oriented to person, place, and time.     Cranial Nerves: No cranial nerve deficit.  Psychiatric:        Mood and Affect: Mood normal.     (all labs ordered are listed, but only abnormal results are displayed) Labs Reviewed  URINALYSIS, ROUTINE W REFLEX MICROSCOPIC - Abnormal; Notable for the following components:      Result Value   Color, Urine STRAW (*)    Glucose, UA >=500 (*)    Hgb urine dipstick SMALL (*)    Ketones, ur 80 (*)    Protein, ur 30 (*)    All other components within normal limits  BASIC METABOLIC PANEL WITH GFR - Abnormal; Notable for the following components:   Sodium 131 (*)    Chloride 95 (*)    CO2 15 (*)    Glucose, Bld 435 (*)    Anion gap 21 (*)    All other components within normal limits  CBG MONITORING, ED - Abnormal; Notable for the following components:   Glucose-Capillary 381 (*)    All other components within normal limits  I-STAT CHEM 8, ED - Abnormal; Notable for the following components:   Sodium 130 (*)    BUN 5 (*)    Glucose, Bld 442 (*)    TCO2 16 (*)    Hemoglobin 15.3 (*)    All other components within normal limits  I-STAT VENOUS BLOOD GAS, ED - Abnormal; Notable for the following components:   pCO2, Ven 31.9 (*)    pO2, Ven 164 (*)    Bicarbonate 18.2 (*)    TCO2 19 (*)    Acid-base deficit 6.0 (*)    Sodium 132 (*)    Hemoglobin 15.3 (*)    All other components within normal limits  CBC  BETA-HYDROXYBUTYRIC ACID  BETA-HYDROXYBUTYRIC ACID  BETA-HYDROXYBUTYRIC ACID  BETA-HYDROXYBUTYRIC ACID  CBG MONITORING, ED    EKG: None  Radiology: DG Chest 2 View Result Date:  08/10/2024 EXAM: 2 VIEW(S) XRAY OF THE CHEST 08/10/2024 10:46:00 AM COMPARISON: 06/25/2024 CLINICAL HISTORY: lung mass FINDINGS: LUNGS AND PLEURA: No focal pulmonary opacity. No pulmonary edema. No pleural effusion. No pneumothorax. HEART AND MEDIASTINUM: No acute abnormality of the cardiac and mediastinal silhouettes. BONES AND SOFT TISSUES: No acute osseous abnormality. IMPRESSION: 1. No acute cardiopulmonary disease. Electronically signed by: Lynwood Seip MD 08/10/2024 11:20 AM EDT  RP Workstation: [id number]     Procedures   Medications Ordered in the ED  sodium chloride  0.9 % bolus 1,000 mL (has no administration in time range)  lactated ringers  bolus 1,578 mL (has no administration in time range)  insulin  regular, human (MYXREDLIN ) 100 units/ 100 mL infusion (has no administration in time range)  lactated ringers  infusion (has no administration in time range)  dextrose  5 % in lactated ringers  infusion (has no administration in time range)  dextrose  50 % solution 0-50 mL (has no administration in time range)  potassium chloride  10 mEq in 100 mL IVPB (has no administration in time range)  sodium chloride  0.9 % bolus 1,000 mL (1,000 mLs Intravenous New Bag/Given 08/10/24 1419)                                    Medical Decision Making Adult female with insulin -dependent diabetes presents with hyperglycemia, polyuria, polydipsia, fatigue.  Concern for complications of diabetes including DKA, nonketotic hyperosmolar state, electrolyte abnormalities. Patient's initial vital signs are reassuring, though she is mildly tachycardic and tachypneic.  Initial point-of-care glucose greater than 400 patient started on fluids, and after remaining labs are notable for ketone urea, anion gap of 20, patient started on insulin , fluids, with potassium repletion admitted for diabetic ketoacidosis.  Amount and/or Complexity of Data Reviewed Independent Historian:     Details: Adult child at bedside External  Data Reviewed: notes.    Details: Clinic note with referral here Labs: ordered. Decision-making details documented in ED Course.  Risk Prescription drug management. Decision regarding hospitalization. Diagnosis or treatment significantly limited by social determinants of health.  3:37 PM Patient, daughter aware of all findings, anion gap, ketone urea, mild acidosis patient starting fluids, insulin .  CRITICAL CARE Performed by: Lamar Salen Total critical care time: 35 minutes Critical care time was exclusive of separately billable procedures and treating other patients. Critical care was necessary to treat or prevent imminent or life-threatening deterioration. Critical care was time spent personally by me on the following activities: development of treatment plan with patient and/or surrogate as well as nursing, discussions with consultants, evaluation of patient's response to treatment, examination of patient, obtaining history from patient or surrogate, ordering and performing treatments and interventions, ordering and review of laboratory studies, ordering and review of radiographic studies, pulse oximetry and re-evaluation of patient's condition.   Final diagnoses:  Diabetic ketoacidosis without coma associated with type 1 diabetes mellitus [facility])    ED Discharge Orders     None          Salen Lamar, MD 08/10/24 1538

## 2024-08-10 NOTE — Patient Instructions (Signed)
-  Please presents to the emergency department for your current symptoms related to your Type 2 Diabetes -Continue follow up with Oncology as scheduled -Follow up with [facility] as needed

## 2024-08-10 NOTE — ED Notes (Signed)
 Patient ambulated to the bathroom.
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
## 2024-08-10 NOTE — Progress Notes (Addendum)
 [address] [place] [address]             [phone]       HPI:  Peggy Warren is a 58 year old female with medical history of TIA, CAD, COPD, tyle 2 diabetes insulin  dependent, substance abuse, and tobacco use.  She was diagnosed with Stage IIA (T2b, N0, M0 ) non-small cell lung cancer, squamous cell carcinoma presented with right upper lobe lung nodule in addition to right hilar and precarinal lymph node diagnosed in July 2025,  She returns for routine postoperative follow-up having undergone Robotic assisted right video thoracoscopy right upper lobectomy, mediastinal lymph node sampling and intercostal nerve block on 06/21/2024 with Dr Shyrl.   She reports that she has been doing ok.  She has been experiencing fatigue with nausea and no appetite .  She has been having excessive thirst.  At oncology visit on 07/25/2024 her blood sugar was 560 and she was given 10 units of insulin  in clinic.  She lost her glucometer and has not been checking her blood sugar.  She has not been using insulin  or ozempic .  Her insulin  prescription has run out.     Her surgical pain has been controlled.  She has been using tylenol  as needed for pain.  Her incision sites have healed. She is occasionally smoking.  She reports smoking around 3 cigarettes but it is not everyday.  She is not using any products for cessation.    Allergies as of 08/10/2024   No Known Allergies      Medication List        Accurate as of August 10, 2024 11:35 AM. If you have any questions, ask your nurse or doctor.          acetaminophen  500 MG tablet Commonly known as: TYLENOL  Take 500-1,000 mg by mouth every 6 (six) hours as needed (pain.).   albuterol  108 (90 Base) MCG/ACT inhaler Commonly known as: VENTOLIN  HFA Inhale 1-2 puffs into the lungs every 6 (six) hours as needed for wheezing or shortness of breath.   amLODipine  10 MG tablet Commonly known as: NORVASC  Take 1 tablet  (10 mg total) by mouth daily.   aspirin  EC 81 MG tablet Take 1 tablet (81 mg total) by mouth daily. Swallow whole.   atorvastatin  40 MG tablet Commonly known as: LIPITOR Take 1 tablet (40 mg total) by mouth daily.   Blood Glucose Monitoring Suppl Devi 1 each by Does not apply route in the morning, at noon, and at bedtime. May substitute to any manufacturer covered by patient's insurance.   Dexcom G7 Receiver Devi Use as directed four times per day E11.9   Dexcom G7 Sensor Misc Use as directed topically one every 10 days E11.9   lisinopril  5 MG tablet Commonly known as: ZESTRIL  Take 1 tablet (5 mg total) by mouth daily.   methocarbamol  500 MG tablet Commonly known as: ROBAXIN  Take 1 tablet (500 mg total) by mouth every 8 (eight) hours as needed for muscle spasms.   multivitamin tablet Take 1 tablet by mouth in the morning.   ondansetron  8 MG tablet Commonly known as: Zofran  Take 1 tablet (8 mg total) by mouth every 8 (eight) hours as needed for nausea or vomiting. Start on the third day after carboplatin.   oxyCODONE  5 MG immediate release tablet Commonly known as: Roxicodone  Take 1 tablet (5 mg total) by mouth every 6 (six) hours as  needed for severe pain (pain score 7-10).   Ozempic  (0.25 or 0.5 MG/DOSE) 2 MG/3ML Sopn Generic drug: Semaglutide (0.25 or 0.5MG /DOS) Take 0.25 mg subcutaneous once weekly E11.9   prochlorperazine 10 MG tablet Commonly known as: COMPAZINE Take 1 tablet (10 mg total) by mouth every 6 (six) hours as needed.   prochlorperazine 10 MG tablet Commonly known as: COMPAZINE Take 1 tablet (10 mg total) by mouth every 6 (six) hours as needed for nausea or vomiting.   umeclidinium-vilanterol 62.5-25 MCG/ACT Aepb Commonly known as: Anoro Ellipta  Inhale 1 puff into the lungs daily.   varenicline  1 MG tablet Commonly known as: Chantix  Continuing Month Pak Take 1 tablet (1 mg total) by mouth 2 (two) times daily.         ROS Review of Systems   Constitutional:  Positive for malaise/fatigue.  Respiratory:  Negative for cough and shortness of breath.   Cardiovascular:  Negative for chest pain and leg swelling.  Gastrointestinal:  Positive for nausea.  Endo/Heme/Allergies:  Positive for polydipsia.      BP 126/83   Pulse 73   Resp 18   Ht 5' 1 (1.549 m)   Wt 174 lb (78.9 kg)   LMP 06/24/2012   SpO2 94%   BMI 32.88 kg/m    Physical Exam Constitutional:      Appearance: Normal appearance.  HENT:     Head: Normocephalic and atraumatic.  Cardiovascular:     Rate and Rhythm: Normal rate and regular rhythm.     Heart sounds: Normal heart sounds, S1 normal and S2 normal.  Pulmonary:     Effort: Pulmonary effort is normal.     Breath sounds: Normal breath sounds.  Musculoskeletal:     Cervical back: Normal range of motion.  Skin:    General: Skin is warm and dry.      Neurological:     General: No focal deficit present.     Mental Status: She is alert.      Imaging: EXAM: 2 VIEW(S) XRAY OF THE CHEST 08/10/2024 10:46:00 AM   COMPARISON: 06/25/2024   CLINICAL HISTORY: lung mass   FINDINGS:   LUNGS AND PLEURA: No focal pulmonary opacity. No pulmonary edema. No pleural effusion. No pneumothorax.   HEART AND MEDIASTINUM: No acute abnormality of the cardiac and mediastinal silhouettes.   BONES AND SOFT TISSUES: No acute osseous abnormality.   IMPRESSION: 1. No acute cardiopulmonary disease.   Electronically signed by: Lynwood Seip MD 08/10/2024 11:20 AM EDT RP Workstation: [id number]   Assessment/Plan:  S/P robot-assisted surgical procedure -We reviewed today chest xray -She is to continue to use tylenol  as needed for pain -Increase exercise/activity as tolerated -Smoking cessation techniques discussed with patient.  She is to try nicorette gum or lozenge to help with cravings.  Continue to attempt complete cessation of cigarettes.  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  She has not used insulin  in 2 weeks for type 2 diabetes -She does not have glucometer currently and has not been able to check home readings of blood sugar  -Due to symptoms and out of prescription of insulin  recommend evaluation in the emergency department at this time -Call PCP upon discharge to schedule visit for continued follow up  of diabetes    Manuelita CHRISTELLA Rough, PA-C 11:35 AM 08/10/24

## 2024-08-11 DIAGNOSIS — E1110 Type 2 diabetes mellitus with ketoacidosis without coma: Secondary | ICD-10-CM

## 2024-08-11 LAB — CBC
HCT: 36.2 % (ref 36.0–46.0)
Hemoglobin: 12.4 g/dL (ref 12.0–15.0)
MCH: 32.4 pg (ref 26.0–34.0)
MCHC: 34.3 g/dL (ref 30.0–36.0)
MCV: 94.5 fL (ref 80.0–100.0)
Platelets: 323 K/uL (ref 150–400)
RBC: 3.83 MIL/uL — ABNORMAL LOW (ref 3.87–5.11)
RDW: 12.4 % (ref 11.5–15.5)
WBC: 6.9 K/uL (ref 4.0–10.5)
nRBC: 0 % (ref 0.0–0.2)

## 2024-08-11 LAB — BASIC METABOLIC PANEL WITH GFR
Anion gap: 10 (ref 5–15)
Anion gap: 13 (ref 5–15)
BUN: <5 mg/dL — ABNORMAL LOW (ref 6–20)
BUN: <5 mg/dL — ABNORMAL LOW (ref 6–20)
CO2: 21 mmol/L — ABNORMAL LOW (ref 22–32)
CO2: 20 mmol/L — ABNORMAL LOW (ref 22–32)
Calcium: 9.1 mg/dL (ref 8.9–10.3)
Calcium: 9 mg/dL (ref 8.9–10.3)
Chloride: 105 mmol/L (ref 98–111)
Chloride: 102 mmol/L (ref 98–111)
Creatinine, Ser: 0.43 mg/dL — ABNORMAL LOW (ref 0.44–1.00)
Creatinine, Ser: 0.5 mg/dL (ref 0.44–1.00)
GFR, Estimated: >60 mL/min (ref >60)
GFR, Estimated: >60 mL/min (ref >60)
Glucose, Bld: 195 mg/dL — ABNORMAL HIGH (ref 70–99)
Glucose, Bld: 295 mg/dL — ABNORMAL HIGH (ref 70–99)
Potassium: 3.5 mmol/L (ref 3.5–5.1)
Potassium: 4.7 mmol/L (ref 3.5–5.1)
Sodium: 136 mmol/L (ref 135–145)
Sodium: 135 mmol/L (ref 135–145)

## 2024-08-11 LAB — BETA-HYDROXYBUTYRIC ACID
Beta-Hydroxybutyric Acid: 0.2 mmol/L (ref 0.05–0.27)
Beta-Hydroxybutyric Acid: 0.4 mmol/L — ABNORMAL HIGH (ref 0.05–0.27)

## 2024-08-11 LAB — MAGNESIUM: Magnesium: 1.4 mg/dL — ABNORMAL LOW (ref 1.7–2.4)

## 2024-08-11 MED ORDER — ACETAMINOPHEN 500 MG PO TABS
500.0000 mg | ORAL_TABLET | Freq: Four times a day (QID) | ORAL | Status: AC | PRN
  Administered 2024-08-11 – 2024-08-12 (×2): 500 mg via ORAL
  Filled 2024-08-11 (×2): qty 1

## 2024-08-11 MED ORDER — INSULIN GLARGINE-YFGN 100 UNIT/ML ~~LOC~~ SOLN
15.0000 [IU] | Freq: Two times a day (BID) | SUBCUTANEOUS | Status: DC
  Administered 2024-08-11: 15 [IU] via SUBCUTANEOUS
  Filled 2024-08-11 (×3): qty 0.15

## 2024-08-11 MED ORDER — MAGNESIUM SULFATE 2 GM/50ML IV SOLN
2.0000 g | Freq: Once | INTRAVENOUS | Status: AC
  Administered 2024-08-11: 2 g via INTRAVENOUS
  Filled 2024-08-11: qty 50

## 2024-08-11 MED ORDER — INSULIN ASPART 100 UNIT/ML IJ SOLN
0.0000 [IU] | Freq: Every day | INTRAMUSCULAR | Status: DC
  Administered 2024-08-11: 4 [IU] via SUBCUTANEOUS
  Administered 2024-08-12: 3 [IU] via SUBCUTANEOUS
  Administered 2024-08-13: 2 [IU] via SUBCUTANEOUS
  Filled 2024-08-11: qty 3

## 2024-08-11 MED ORDER — INSULIN ASPART 100 UNIT/ML IJ SOLN
5.0000 [IU] | Freq: Three times a day (TID) | INTRAMUSCULAR | Status: DC
  Administered 2024-08-11 – 2024-08-12 (×3): 5 [IU] via SUBCUTANEOUS

## 2024-08-11 MED ORDER — INSULIN ASPART 100 UNIT/ML IJ SOLN
0.0000 [IU] | Freq: Three times a day (TID) | INTRAMUSCULAR | Status: DC
  Administered 2024-08-11 – 2024-08-12 (×4): 11 [IU] via SUBCUTANEOUS
  Administered 2024-08-12: 20 [IU] via SUBCUTANEOUS
  Administered 2024-08-12: 4 [IU] via SUBCUTANEOUS
  Administered 2024-08-13: 20 [IU] via SUBCUTANEOUS
  Administered 2024-08-13: 11 [IU] via SUBCUTANEOUS
  Administered 2024-08-14: 7 [IU] via SUBCUTANEOUS
  Administered 2024-08-14: 4 [IU] via SUBCUTANEOUS
  Filled 2024-08-11: qty 4
  Filled 2024-08-11: qty 7
  Filled 2024-08-11: qty 20
  Filled 2024-08-11: qty 11
  Filled 2024-08-11: qty 20
  Filled 2024-08-11: qty 4

## 2024-08-11 MED ORDER — DEXTROSE IN LACTATED RINGERS 5 % IV SOLN: INTRAVENOUS | Status: DC

## 2024-08-11 MED ORDER — POTASSIUM CHLORIDE CRYS ER 20 MEQ PO TBCR
40.0000 meq | EXTENDED_RELEASE_TABLET | Freq: Once | ORAL | Status: AC
  Administered 2024-08-11: 40 meq via ORAL
  Filled 2024-08-11: qty 2

## 2024-08-11 MED ORDER — INSULIN GLARGINE-YFGN 100 UNIT/ML ~~LOC~~ SOLN
10.0000 [IU] | Freq: Every day | SUBCUTANEOUS | Status: DC
  Administered 2024-08-11: 10 [IU] via SUBCUTANEOUS
  Filled 2024-08-11: qty 0.1

## 2024-08-11 MED ORDER — INSULIN REGULAR(HUMAN) IN NACL 100-0.9 UT/100ML-% IV SOLN
INTRAVENOUS | Status: DC
  Administered 2024-08-11: 4.6 [IU]/h via INTRAVENOUS
  Filled 2024-08-11 (×2): qty 100

## 2024-08-11 NOTE — Progress Notes (Signed)
 PROGRESS NOTE  Peggy Warren FMW:[id number] DOB: 1965/12/28   PCP: [name], MD  Patient is from: Home.  Independently ambulates at baseline.  DOA: 08/10/2024 LOS: 1  Chief complaints Elevated blood glucose    Brief Narrative / Interim history: 58 year old F with PMH of COPD, lung cancer, DM-2, HTN, tobacco use disorder and GERD presented to ED with high blood glucose, and admitted with DKA.  Patient stopped Ozempic  2 to 3 weeks ago due to significantly reduced appetite and discomfort with eating.  She says she was taken off insulin  a long time ago since her A1c was good.  She admits to dietary indiscretion.  DKA resolved overnight and she was transition to subcu insulin .  Subjective: Seen and examined earlier this morning.  No major events overnight or this morning.  No complaints other than stye on her left upper eyelid.  Denies fever, chills, cough, chest pain, shortness of breath, GI or UTI symptoms.   Assessment and plan: Poorly controlled DM-2 with DKA and hyperglycemia: HHS ruled out.  Last A1c 6.1% on 8/12.  Stopped Ozempic  due to significantly decreased appetite and discomfort.  Admits to dietary indiscretion.  DKA resolved.  Latest CBG 295 at 8 AM. Recent Labs  Lab 08/10/24 1827 08/10/24 1940 08/10/24 2043 08/10/24 2140 08/10/24 2315  GLUCAP 214* 230* 186* 201* 278*  - Continue SSI-resistant scale -Add NovoLog  5 units 3 times daily with meals -Continue Semglee  10 units daily.  Will increase this based on CBG -Appreciate input by diabetic coordinator. -Needs teaching, diabetic supplies and insulin  on discharge.   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 S/p robot-assisted right lobectomy on 9/11.  She was to start chemotherapy on 10/29 after education - Patient follow-up with oncology.  Essential hypertension: Normotensive -Lisinopril  increased from 5 to 20 mg on admission.   Tobacco use disorder: Reports smoking about a pack a day.  Started  Chantix  - Continue on Chantix .  Chronic COPD: Stable - Continue home inhalers.  History of polysubstance use: Sober.  Hypokalemia/hypomagnesemia -Monitor replenish K and Mg as appropriate  Left upper eyelid stye - Recommended frequent warm compress  Class I obesity Body mass index is 32.95 kg/m.          DVT prophylaxis:  SCDs Start: 08/10/24 1547  Code Status: Full code Family Communication: None at bedside Level of care: Med-Surg Status is: Inpatient Remains inpatient appropriate because: Poorly controlled diabetes with hyperglycemia and DKA   Final disposition: Home   55 minutes with more than 50% spent in reviewing records, counseling patient/family and coordinating care.  Consultants:  Diabetic coordinator  Procedures: None  Microbiology summarized: None  Objective: Vitals:   08/10/24 2200 08/10/24 2320 08/11/24 0345 08/11/24 0753  BP: (!) 155/80 (!) 150/69 (!) 152/70 (!) 133/56  Pulse:  69 66   Resp: 18 20 18 20   Temp:  98.8 F (37.1 C) 98.4 F (36.9 C) 97.9 F (36.6 C)  TempSrc:  Oral Oral Oral  SpO2:  94% 95% 98%  Weight:      Height:        Examination:  GENERAL: No apparent distress.  Nontoxic. HEENT: MMM.  Vision and hearing grossly intact.  NECK: Supple.  No apparent JVD.  RESP:  No IWOB.  Fair aeration bilaterally. CVS:  RRR. Heart sounds normal.  ABD/GI/GU: BS+. Abd soft, NTND.  MSK/EXT:  Moves extremities. No apparent deformity. No edema.  SKIN: no apparent skin lesion or wound NEURO: AA.  Oriented appropriately.  No apparent focal neuro deficit. PSYCH: Calm. Normal affect.   Sch Meds:  Scheduled Meds:  amLODipine   10 mg Oral Daily   aspirin  EC  81 mg Oral Daily   atorvastatin   40 mg Oral Daily   insulin  aspart  0-20 Units Subcutaneous TID WC   insulin  aspart  0-5 Units Subcutaneous QHS   insulin  aspart  5 Units Subcutaneous TID WC   insulin  glargine-yfgn  10 Units Subcutaneous Daily   lisinopril   20 mg Oral Daily    umeclidinium-vilanterol  1 puff Inhalation Daily   Continuous Infusions:  lactated ringers      PRN Meds:.acetaminophen , dextrose , ipratropium-albuterol   Antimicrobials: Anti-infectives (From admission, onward)    Start     Dose/Rate Route Frequency Ordered Stop   08/10/24 1630  fluconazole  (DIFLUCAN ) tablet 150 mg        150 mg Oral  Once 08/10/24 1623 08/10/24 1830        I have personally reviewed the following labs and images: CBC: Recent Labs  Lab 08/10/24 1249 08/10/24 1259 08/10/24 1427 08/11/24 0355  WBC 7.9  --   --  6.9  HGB 14.0 15.3* 15.3* 12.4  HCT 42.5 45.0 45.0 36.2  MCV 95.3  --   --  94.5  PLT 392  --   --  323   BMP &GFR Recent Labs  Lab 08/10/24 1249 08/10/24 1259 08/10/24 1427 08/10/24 2030 08/10/24 2236 08/11/24 0355 08/11/24 0800  NA 131* 130* 132* 135 133* 136 135  K 3.9 3.8 3.8 3.2* 4.6 3.5 4.7  CL 95* 99  --  103 101 105 102  CO2 15*  --   --  20* 19* 21* 20*  GLUCOSE 435* 442*  --  193* 219* 195* 295*  BUN 6 5*  --  <5* <5* <5* <5*  CREATININE 0.84 0.50  --  0.57 0.50 0.43* 0.50  CALCIUM  10.0  --   --  8.9 8.8* 9.1 9.0  MG  --   --   --   --   --  1.4*  --    Estimated Creatinine Clearance: 73.6 mL/min (by C-G formula based on SCr of 0.5 mg/dL). Liver & Pancreas: No results for input(s): AST, ALT, ALKPHOS, BILITOT, PROT, ALBUMIN  in the last 168 hours. No results for input(s): LIPASE, AMYLASE in the last 168 hours. No results for input(s): AMMONIA in the last 168 hours. Diabetic: No results for input(s): HGBA1C in the last 72 hours. Recent Labs  Lab 08/10/24 1827 08/10/24 1940 08/10/24 2043 08/10/24 2140 08/10/24 2315  GLUCAP 214* 230* 186* 201* 278*   Cardiac Enzymes: No results for input(s): CKTOTAL, CKMB, CKMBINDEX, TROPONINI in the last 168 hours. No results for input(s): PROBNP in the last 8760 hours. Coagulation Profile: No results for input(s): INR, PROTIME in the last 168  hours. Thyroid  Function Tests: No results for input(s): TSH, T4TOTAL, FREET4, T3FREE, THYROIDAB in the last 72 hours. Lipid Profile: No results for input(s): CHOL, HDL, LDLCALC, TRIG, CHOLHDL, LDLDIRECT in the last 72 hours. Anemia Panel: No results for input(s): VITAMINB12, FOLATE, FERRITIN, TIBC, IRON, RETICCTPCT in the last 72 hours. Urine analysis:    Component Value Date/Time   COLORURINE STRAW (A) 08/10/2024 1255   APPEARANCEUR CLEAR 08/10/2024 1255   LABSPEC 1.029 08/10/2024 1255   PHURINE 5.0 08/10/2024 1255   GLUCOSEU >=500 (A) 08/10/2024 1255   GLUCOSEU NEGATIVE 08/25/2023 1125   HGBUR SMALL (A) 08/10/2024 1255   BILIRUBINUR NEGATIVE 08/10/2024 1255   KETONESUR 80 (A)  08/10/2024 1255   PROTEINUR 30 (A) 08/10/2024 1255   UROBILINOGEN 0.2 08/25/2023 1125   NITRITE NEGATIVE 08/10/2024 1255   LEUKOCYTESUR NEGATIVE 08/10/2024 1255   Sepsis Labs: Invalid input(s): PROCALCITONIN, LACTICIDVEN  Microbiology: No results found for this or any previous visit (from the past 240 hours).  Radiology Studies: No results found.    Jiayi Lengacher T. Tyray Proch [facility]  If 7PM-7AM, please contact night-coverage [email or web address] 08/11/2024, 11:12 AM

## 2024-08-11 NOTE — Inpatient Diabetes Management (Signed)
 Inpatient Diabetes Program Recommendations  AACE/ADA: New Consensus Statement on Inpatient Glycemic Control (2015)  Target Ranges:  Prepandial:   less than 140 mg/dL      Peak postprandial:   less than 180 mg/dL (1-2 hours)      Critically ill patients:  140 - 180 mg/dL   Lab Results  Component Value Date   GLUCAP 278 (H) 08/10/2024   HGBA1C 6.1 (A) 05/22/2024    Review of Glycemic Control  Diabetes history: DM2 Outpatient Diabetes medications: None Current orders for Inpatient glycemic control: Semglee  10 units every day, Novolog  0-20 units TID and HS, Novolog  5 units TID  Noted consult for Diabetes Coordinator. Diabetes Coordinator is not on campus over the weekend but available by pager from 8am to 5pm for questions or concerns. Chart reviewed.   Spoke with patient over the phone.  Admitted with DKA; stopped Ozempic  0.25 mg weekly 2-3 weeks ago due to decreased PO intake and stomach upset.  She has used long acting and rapid insulin  in the past and was taken off insulin  about 1 year ago because her glucose was trending well.  Started Ozempic  recently and could not tolerate side effects.    A1C is pending.  I told her she will likely need insulin  at home.  She is interested in a CGM.  She has Medicaid and will need a prior auth for the CGM.  Will ask our [facility] pharmacy to start a PA.  She remembers how to administer insulin  using an insulin  pen.  We reviewed S&S of hypoglycemia and treatments.  Also reviewed S&S of hyperglycemia.   Educated on The Plate Method, CHO's, portion control, avoiding caloric beverages, CBGs at home fasting and mid afternoon, F/U with PCP every 3 months, bring meter to PCP office, long and short term complications of uncontrolled BG, and importance of exercise.  She has been drinking regular sodas at home.  Will follow and will likely need more basal insulin  given IV insulin  drip rates.    Thank you, Wyvonna Pinal, MSN, CDCES Diabetes Coordinator Inpatient  Diabetes Program [phone] (team pager from 8a-5p)

## 2024-08-11 NOTE — Plan of Care (Signed)

## 2024-08-12 DIAGNOSIS — E1110 Type 2 diabetes mellitus with ketoacidosis without coma: Secondary | ICD-10-CM | POA: Diagnosis not present

## 2024-08-12 LAB — GLUCOSE, CAPILLARY
Glucose-Capillary: 347 mg/dL — ABNORMAL HIGH (ref 70–99)
Glucose-Capillary: 360 mg/dL — ABNORMAL HIGH (ref 70–99)
Glucose-Capillary: 159 mg/dL — ABNORMAL HIGH (ref 70–99)
Glucose-Capillary: 177 mg/dL — ABNORMAL HIGH (ref 70–99)
Glucose-Capillary: 187 mg/dL — ABNORMAL HIGH (ref 70–99)
Glucose-Capillary: 199 mg/dL — ABNORMAL HIGH (ref 70–99)
Glucose-Capillary: 211 mg/dL — ABNORMAL HIGH (ref 70–99)
Glucose-Capillary: 251 mg/dL — ABNORMAL HIGH (ref 70–99)
Glucose-Capillary: 272 mg/dL — ABNORMAL HIGH (ref 70–99)
Glucose-Capillary: 277 mg/dL — ABNORMAL HIGH (ref 70–99)
Glucose-Capillary: 294 mg/dL — ABNORMAL HIGH (ref 70–99)
Glucose-Capillary: 301 mg/dL — ABNORMAL HIGH (ref 70–99)
Glucose-Capillary: 199 mg/dL — ABNORMAL HIGH (ref 70–99)
Glucose-Capillary: 201 mg/dL — ABNORMAL HIGH (ref 70–99)
Glucose-Capillary: 231 mg/dL — ABNORMAL HIGH (ref 70–99)
Glucose-Capillary: 279 mg/dL — ABNORMAL HIGH (ref 70–99)

## 2024-08-12 LAB — RENAL FUNCTION PANEL
Albumin: 3.1 g/dL — ABNORMAL LOW (ref 3.5–5.0)
Anion gap: 10 (ref 5–15)
BUN: <5 mg/dL — ABNORMAL LOW (ref 6–20)
CO2: 22 mmol/L (ref 22–32)
Calcium: 9 mg/dL (ref 8.9–10.3)
Chloride: 103 mmol/L (ref 98–111)
Creatinine, Ser: 0.52 mg/dL (ref 0.44–1.00)
GFR, Estimated: >60 mL/min (ref >60)
Glucose, Bld: 299 mg/dL — ABNORMAL HIGH (ref 70–99)
Phosphorus: 4.7 mg/dL — ABNORMAL HIGH (ref 2.5–4.6)
Potassium: 4.1 mmol/L (ref 3.5–5.1)
Sodium: 135 mmol/L (ref 135–145)

## 2024-08-12 LAB — CBC
HCT: 37.7 % (ref 36.0–46.0)
Hemoglobin: 12.6 g/dL (ref 12.0–15.0)
MCH: 32 pg (ref 26.0–34.0)
MCHC: 33.4 g/dL (ref 30.0–36.0)
MCV: 95.7 fL (ref 80.0–100.0)
Platelets: 319 K/uL (ref 150–400)
RBC: 3.94 MIL/uL (ref 3.87–5.11)
RDW: 12.6 % (ref 11.5–15.5)
WBC: 5.6 K/uL (ref 4.0–10.5)
nRBC: 0 % (ref 0.0–0.2)

## 2024-08-12 LAB — MAGNESIUM: Magnesium: 1.6 mg/dL — ABNORMAL LOW (ref 1.7–2.4)

## 2024-08-12 MED ORDER — INSULIN GLARGINE-YFGN 100 UNIT/ML ~~LOC~~ SOLN
35.0000 [IU] | Freq: Every day | SUBCUTANEOUS | Status: DC
  Filled 2024-08-12: qty 0.35

## 2024-08-12 MED ORDER — INSULIN GLARGINE-YFGN 100 UNIT/ML ~~LOC~~ SOLN
20.0000 [IU] | Freq: Every day | SUBCUTANEOUS | Status: DC
  Administered 2024-08-12: 20 [IU] via SUBCUTANEOUS
  Filled 2024-08-12 (×2): qty 0.2

## 2024-08-12 MED ORDER — METFORMIN HCL 500 MG PO TABS
500.0000 mg | ORAL_TABLET | Freq: Two times a day (BID) | ORAL | Status: DC
  Administered 2024-08-12 – 2024-08-14 (×5): 500 mg via ORAL
  Filled 2024-08-12 (×5): qty 1

## 2024-08-12 MED ORDER — ACETAMINOPHEN 325 MG PO TABS
650.0000 mg | ORAL_TABLET | Freq: Four times a day (QID) | ORAL | Status: DC | PRN
  Administered 2024-08-12 – 2024-08-14 (×4): 650 mg via ORAL
  Filled 2024-08-12 (×4): qty 2

## 2024-08-12 MED ORDER — INSULIN ASPART 100 UNIT/ML IJ SOLN
7.0000 [IU] | Freq: Three times a day (TID) | INTRAMUSCULAR | Status: DC
  Administered 2024-08-12 (×2): 7 [IU] via SUBCUTANEOUS
  Filled 2024-08-12 (×2): qty 7

## 2024-08-12 MED ORDER — MAGNESIUM SULFATE 2 GM/50ML IV SOLN
2.0000 g | Freq: Once | INTRAVENOUS | Status: AC
  Administered 2024-08-12: 2 g via INTRAVENOUS
  Filled 2024-08-12: qty 50

## 2024-08-12 MED ORDER — INSULIN GLARGINE-YFGN 100 UNIT/ML ~~LOC~~ SOLN
35.0000 [IU] | Freq: Every day | SUBCUTANEOUS | Status: DC
  Administered 2024-08-12: 35 [IU] via SUBCUTANEOUS
  Filled 2024-08-12 (×2): qty 0.35

## 2024-08-12 NOTE — Plan of Care (Signed)

## 2024-08-12 NOTE — Progress Notes (Signed)
 PROGRESS NOTE  Peggy Warren FMW:[id number] DOB: 06-29-1966   PCP: [name], MD  Patient is from: Home.  Independently ambulates at baseline.  DOA: 08/10/2024 LOS: 2  Chief complaints Elevated blood glucose    Brief Narrative / Interim history: 58 year old F with PMH of COPD, lung cancer, DM-2, HTN, tobacco use disorder and GERD presented to ED with high blood glucose, and admitted with DKA.  Patient stopped Ozempic  2 to 3 weeks ago due to significantly reduced appetite and discomfort with eating.  She says she was taken off insulin  a long time ago since her A1c was good.  She admits to dietary indiscretion.  DKA resolved overnight and she was transition to subcu insulin .  Titrating insulin  for hyperglycemia.  Subjective: Seen and examined earlier this morning.  No major events overnight or this morning.  CBG remains elevated despite titration of insulin .   Assessment and plan: Poorly controlled DM-2 with DKA and hyperglycemia: HHS ruled out.  Last A1c 6.1% on 8/12.  Stopped Ozempic  due to significantly decreased appetite and discomfort.  DKA resolved but significant hyperglycemia. Recent Labs  Lab 08/10/24 1940 08/10/24 2043 08/10/24 2140 08/10/24 2315 08/12/24 0853  GLUCAP 230* 186* 201* 278* 347*  -Continue SSI-resistant scale -Increase NovoLog  from 5 to 7 units 3 times daily with meals -Increase Semglee  from 15 units twice daily to 35 units daily -Start low-dose metformin  500 mg twice daily -Appreciate input by diabetic coordinator. -Needs teaching, diabetic supplies and insulin  on discharge.   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 S/p robot-assisted right lobectomy on 9/11.  She was to start chemotherapy on 10/29 after education - Patient follow-up with oncology.  Essential hypertension: Normotensive -Lisinopril  increased from 5 to 20 mg on admission.   Tobacco use disorder: Reports smoking about a pack a day.  Started Chantix  - Continue on  Chantix .  Chronic COPD: Stable - Continue home inhalers.  History of polysubstance use: Sober.  Hypokalemia/hypomagnesemia -Monitor replenish K and Mg as appropriate  Left upper eyelid stye - Recommended frequent warm compress  Class I obesity Body mass index is 32.95 kg/m.          DVT prophylaxis:  SCDs Start: 08/10/24 1547  Code Status: Full code Family Communication: None at bedside Level of care: Med-Surg Status is: Inpatient Remains inpatient appropriate because: Poorly controlled diabetes with hyperglycemia   Final disposition: Home   55 minutes with more than 50% spent in reviewing records, counseling patient/family and coordinating care.  Consultants:  Diabetic coordinator  Procedures: None  Microbiology summarized: None  Objective: Vitals:   08/11/24 2340 08/12/24 0407 08/12/24 0751 08/12/24 0857  BP: 123/74 130/64 110/69 114/70  Pulse: 68 69  68  Resp: 18 20  20   Temp: 98.3 F (36.8 C) 98.6 F (37 C) 98.7 F (37.1 C) 98.4 F (36.9 C)  TempSrc: Oral Oral Oral   SpO2: 97% 92% 95% 91%  Weight:      Height:        Examination:  GENERAL: No apparent distress.  Nontoxic. HEENT: MMM.  Vision and hearing grossly intact.  Stye over left upper eyelid. NECK: Supple.  No apparent JVD.  RESP:  No IWOB.  Fair aeration bilaterally. CVS:  RRR. Heart sounds normal.  ABD/GI/GU: BS+. Abd soft, NTND.  MSK/EXT:  Moves extremities. No apparent deformity. No edema.  SKIN: no apparent skin lesion or wound NEURO: AA.  Oriented appropriately.  No apparent focal neuro deficit. PSYCH: Calm. Normal affect.  Sch Meds:  Scheduled Meds:  amLODipine   10 mg Oral Daily   aspirin  EC  81 mg Oral Daily   atorvastatin   40 mg Oral Daily   insulin  aspart  0-20 Units Subcutaneous TID WC   insulin  aspart  0-5 Units Subcutaneous QHS   insulin  aspart  7 Units Subcutaneous TID WC   insulin  glargine-yfgn  35 Units Subcutaneous Daily   lisinopril   20 mg Oral Daily    metFORMIN   500 mg Oral BID WC   umeclidinium-vilanterol  1 puff Inhalation Daily   Continuous Infusions:  magnesium  sulfate bolus IVPB 2 g (08/12/24 0956)   PRN Meds:.dextrose , ipratropium-albuterol   Antimicrobials: Anti-infectives (From admission, onward)    Start     Dose/Rate Route Frequency Ordered Stop   08/10/24 1630  fluconazole  (DIFLUCAN ) tablet 150 mg        150 mg Oral  Once 08/10/24 1623 08/10/24 1830        I have personally reviewed the following labs and images: CBC: Recent Labs  Lab 08/10/24 1249 08/10/24 1259 08/10/24 1427 08/11/24 0355 08/12/24 0300  WBC 7.9  --   --  6.9 5.6  HGB 14.0 15.3* 15.3* 12.4 12.6  HCT 42.5 45.0 45.0 36.2 37.7  MCV 95.3  --   --  94.5 95.7  PLT 392  --   --  323 319   BMP &GFR Recent Labs  Lab 08/10/24 2030 08/10/24 2236 08/11/24 0355 08/11/24 0800 08/12/24 0300  NA 135 133* 136 135 135  K 3.2* 4.6 3.5 4.7 4.1  CL 103 101 105 102 103  CO2 20* 19* 21* 20* 22  GLUCOSE 193* 219* 195* 295* 299*  BUN <5* <5* <5* <5* <5*  CREATININE 0.57 0.50 0.43* 0.50 0.52  CALCIUM  8.9 8.8* 9.1 9.0 9.0  MG  --   --  1.4*  --  1.6*  PHOS  --   --   --   --  4.7*   Estimated Creatinine Clearance: 73.6 mL/min (by C-G formula based on SCr of 0.52 mg/dL). Liver & Pancreas: Recent Labs  Lab 08/12/24 0300  ALBUMIN  3.1*   No results for input(s): LIPASE, AMYLASE in the last 168 hours. No results for input(s): AMMONIA in the last 168 hours. Diabetic: No results for input(s): HGBA1C in the last 72 hours. Recent Labs  Lab 08/10/24 1940 08/10/24 2043 08/10/24 2140 08/10/24 2315 08/12/24 0853  GLUCAP 230* 186* 201* 278* 347*   Cardiac Enzymes: No results for input(s): CKTOTAL, CKMB, CKMBINDEX, TROPONINI in the last 168 hours. No results for input(s): PROBNP in the last 8760 hours. Coagulation Profile: No results for input(s): INR, PROTIME in the last 168 hours. Thyroid  Function Tests: No results for  input(s): TSH, T4TOTAL, FREET4, T3FREE, THYROIDAB in the last 72 hours. Lipid Profile: No results for input(s): CHOL, HDL, LDLCALC, TRIG, CHOLHDL, LDLDIRECT in the last 72 hours. Anemia Panel: No results for input(s): VITAMINB12, FOLATE, FERRITIN, TIBC, IRON, RETICCTPCT in the last 72 hours. Urine analysis:    Component Value Date/Time   COLORURINE STRAW (A) 08/10/2024 1255   APPEARANCEUR CLEAR 08/10/2024 1255   LABSPEC 1.029 08/10/2024 1255   PHURINE 5.0 08/10/2024 1255   GLUCOSEU >=500 (A) 08/10/2024 1255   GLUCOSEU NEGATIVE 08/25/2023 1125   HGBUR SMALL (A) 08/10/2024 1255   BILIRUBINUR NEGATIVE 08/10/2024 1255   KETONESUR 80 (A) 08/10/2024 1255   PROTEINUR 30 (A) 08/10/2024 1255   UROBILINOGEN 0.2 08/25/2023 1125   NITRITE NEGATIVE 08/10/2024 1255   LEUKOCYTESUR NEGATIVE  08/10/2024 1255   Sepsis Labs: Invalid input(s): PROCALCITONIN, LACTICIDVEN  Microbiology: No results found for this or any previous visit (from the past 240 hours).  Radiology Studies: No results found.    Peggy Warren T. Peggy Warren [facility]  If 7PM-7AM, please contact night-coverage [email or web address] 08/12/2024, 10:44 AM

## 2024-08-12 NOTE — Plan of Care (Signed)
   Problem: Education: Goal: Knowledge of General Education information will improve Description: Including pain rating scale, medication(s)/side effects and non-pharmacologic comfort measures Outcome: Progressing   Problem: Activity: Goal: Risk for activity intolerance will decrease Outcome: Progressing   Problem: Nutrition: Goal: Adequate nutrition will be maintained Outcome: Progressing   Problem: Coping: Goal: Level of anxiety will decrease Outcome: Progressing

## 2024-08-12 NOTE — Progress Notes (Signed)
 [facility] night cross cover note:   Per pt's request, I have ordered prn acetaminophen  for generalized pain.     Eva Pore, DO Hospitalist

## 2024-08-13 ENCOUNTER — Telehealth (HOSPITAL_COMMUNITY): Payer: Self-pay

## 2024-08-13 ENCOUNTER — Other Ambulatory Visit (HOSPITAL_COMMUNITY): Payer: Self-pay

## 2024-08-13 ENCOUNTER — Other Ambulatory Visit: Payer: Self-pay | Admitting: Physician Assistant

## 2024-08-13 ENCOUNTER — Telehealth (HOSPITAL_COMMUNITY): Payer: Self-pay | Admitting: Pharmacy Technician

## 2024-08-13 ENCOUNTER — Encounter (HOSPITAL_COMMUNITY): Payer: Self-pay

## 2024-08-13 ENCOUNTER — Encounter: Payer: Self-pay | Admitting: Radiology

## 2024-08-13 DIAGNOSIS — E1110 Type 2 diabetes mellitus with ketoacidosis without coma: Secondary | ICD-10-CM | POA: Diagnosis not present

## 2024-08-13 DIAGNOSIS — C3411 Malignant neoplasm of upper lobe, right bronchus or lung: Secondary | ICD-10-CM

## 2024-08-13 LAB — CBC
HCT: 39.3 % (ref 36.0–46.0)
Hemoglobin: 13.1 g/dL (ref 12.0–15.0)
MCH: 32 pg (ref 26.0–34.0)
MCHC: 33.3 g/dL (ref 30.0–36.0)
MCV: 96.1 fL (ref 80.0–100.0)
Platelets: 311 K/uL (ref 150–400)
RBC: 4.09 MIL/uL (ref 3.87–5.11)
RDW: 12.7 % (ref 11.5–15.5)
WBC: 5.7 K/uL (ref 4.0–10.5)
nRBC: 0 % (ref 0.0–0.2)

## 2024-08-13 LAB — RENAL FUNCTION PANEL
Albumin: 3.4 g/dL — ABNORMAL LOW (ref 3.5–5.0)
Anion gap: 12 (ref 5–15)
BUN: 6 mg/dL (ref 6–20)
CO2: 22 mmol/L (ref 22–32)
Calcium: 8.9 mg/dL (ref 8.9–10.3)
Chloride: 100 mmol/L (ref 98–111)
Creatinine, Ser: 0.54 mg/dL (ref 0.44–1.00)
GFR, Estimated: >60 mL/min (ref >60)
Glucose, Bld: 308 mg/dL — ABNORMAL HIGH (ref 70–99)
Phosphorus: 4.5 mg/dL (ref 2.5–4.6)
Potassium: 4 mmol/L (ref 3.5–5.1)
Sodium: 134 mmol/L — ABNORMAL LOW (ref 135–145)

## 2024-08-13 LAB — HEMOGLOBIN A1C
Hgb A1c MFr Bld: 12.5 % — ABNORMAL HIGH (ref 4.8–5.6)
Mean Plasma Glucose: 312 mg/dL

## 2024-08-13 LAB — GLUCOSE, CAPILLARY
Glucose-Capillary: 269 mg/dL — ABNORMAL HIGH (ref 70–99)
Glucose-Capillary: 286 mg/dL — ABNORMAL HIGH (ref 70–99)
Glucose-Capillary: 469 mg/dL — ABNORMAL HIGH (ref 70–99)
Glucose-Capillary: 294 mg/dL — ABNORMAL HIGH (ref 70–99)
Glucose-Capillary: 102 mg/dL — ABNORMAL HIGH (ref 70–99)
Glucose-Capillary: 204 mg/dL — ABNORMAL HIGH (ref 70–99)

## 2024-08-13 LAB — MAGNESIUM: Magnesium: 1.8 mg/dL (ref 1.7–2.4)

## 2024-08-13 MED ORDER — INSULIN GLARGINE-YFGN 100 UNIT/ML ~~LOC~~ SOLN
35.0000 [IU] | Freq: Two times a day (BID) | SUBCUTANEOUS | Status: DC
  Administered 2024-08-13 – 2024-08-14 (×3): 35 [IU] via SUBCUTANEOUS
  Filled 2024-08-13 (×4): qty 0.35

## 2024-08-13 MED ORDER — INSULIN ASPART 100 UNIT/ML IJ SOLN
10.0000 [IU] | Freq: Three times a day (TID) | INTRAMUSCULAR | Status: DC
  Administered 2024-08-13 – 2024-08-14 (×5): 10 [IU] via SUBCUTANEOUS
  Filled 2024-08-13 (×5): qty 10

## 2024-08-13 MED ORDER — LIVING WELL WITH DIABETES BOOK
Freq: Once | Status: AC
  Filled 2024-08-13: qty 1

## 2024-08-13 MED ORDER — ORAL CARE MOUTH RINSE: 15.0000 mL | OROMUCOSAL | Status: DC | PRN

## 2024-08-13 NOTE — Telephone Encounter (Signed)
 Pharmacy Patient Advocate Encounter  Received notification from [facility] MEDICAID that Prior Authorization for Freestyle Libre 3 Plus has been APPROVED from 08/13/24 to 02/10/25. Ran test claim, Copay is $0. This test claim was processed through [facility]- copay amounts may vary at other pharmacies due to pharmacy/plan contracts, or as the patient moves through the different stages of their insurance plan.   PA #/Case ID/Reference #: [id number]

## 2024-08-13 NOTE — Inpatient Diabetes Management (Addendum)
 Inpatient Diabetes Program Recommendations  AACE/ADA: New Consensus Statement on Inpatient Glycemic Control   Target Ranges:  Prepandial:   less than 140 mg/dL      Peak postprandial:   less than 180 mg/dL (1-2 hours)      Critically ill patients:  140 - 180 mg/dL   Lab Results  Component Value Date   GLUCAP 294 (H) 08/13/2024   HGBA1C 12.5 (H) 08/11/2024    Latest Reference Range & Units 08/13/24 01:14 08/13/24 06:31 08/13/24 08:44 08/13/24 11:43  Glucose-Capillary 70 - 99 mg/dL 730 (H) 713 (H) 530 (H) 294 (H)   Review of Glycemic Control  Diabetes history:DM2  Outpatient Diabetes medications:  Ozempic   (has not taking in over 2 weeks)   Current orders for Inpatient glycemic control:  Semglee  35 units BID  Novolog  0-20 units TID + 0-5 units at bedtime  Novolog  10 units TID with meals  Metformin  500mg  BID   Inpatient Diabetes Program Recommendations:   Spoke with patient at bedside. Discussed A1C results 12.5% and explained what an A1C is and informed patient that her current A1C indicates an average glucose of 312 mg/dl over the past 2-3 months. Patient states she sees Dr Lynwood with Internal Medicine for DM control - last seen on 06/27/24.  Discussed basic pathophysiology of DM Type 2. Reviewed glucose and A1C goals Reviewed signs and symptoms of hyperglycemia and hypoglycemia along with treatment for both.  Discussed impact of nutrition, exercise, stress, sickness, and medications on diabetes control. Living Well with diabetes booklet ordered and encouraged patient to read through entire book. Informed patient that she will most likely be prescribed long acting and short acting insulin  at discharge. Patient states she has taken Lantus  and Novolog  Insulin  Pens before and is very familiar with how to administer insulin . Demonstrated how to administer Insulin  via pen to confirm pt remembered. Patient states she stopped taking her insulin  a long time ago when she resumed drinking  alcohol. She expressed she is now sober and wants to get her diabetes under control and take care of herself.  Patient does not have a meter to check CBG. She was prescribed CGM in the past but never got it from the pharmacy - Unsure if Dexcom vs [place]. She is very interested in CGM but wants a reader. Patient states she does not know how to use her smartphone very well. If patient does not start using CGM, instructed her to check her glucose 4 times per day (before meals and at bedtime) and to keep a log book of glucose readings and insulin  taken. Patient verbalized understanding of information discussed and has no further questions at this time related to diabetes.     - Living Well with Diabetes book ordered.  - Message sent to [facility] to determine cost of Short/long acting Insulin  and CGM.   Thanks,  Peggy Search, RN, MSN, [facility]  Inpatient Diabetes Coordinator  Pager [phone] (8a-5p)

## 2024-08-13 NOTE — TOC CM/SW Note (Signed)
 Transition of Care [facility]) - Inpatient Brief Assessment   Patient Details  Name: Peggy Warren MRN: [id number] Date of Birth: 03/27/1966  [facility]) CM/SW Contact:    Tom-Johnson, Harvest Muskrat, RN Phone Number: 08/13/2024, 10:17 AM   Clinical Narrative:  Patient presented to the ED with elevated Blood Glucose level at 560 in the ED, admitted with DKA . Patient has hx of Lung Cancer s/p Rt Lobectomy on 06/21/24, GERD, COPD, HTN, T2DM which is poorly controlled. Diabetes Coordinator following.    CM spoke with patient at bedside about needs for post hospital transition. Lives alone, has a supportive daughter who transports patient to and from her appointments. Has a supportive brother. Not employed, on disability. Has a cane at home.   PCP is [name], MD and uses [facility].  Patient states her insurance pays for her medication and does not need assistance with payments. States she lost her Glucometer and that's how she lost track of her CBG levels. States she will get her Glucometer from the pharmacy at discharge.   No ICM needs or recommendations noted at this time.  Patient not Medically ready for discharge.  CM will continue to follow as patient progresses with care towards discharge.            Transition of Care Asessment: Insurance and Status: Insurance coverage has been reviewed Patient has primary care physician: Yes Home environment has been reviewed: Yes Prior level of function:: Modified Independent Prior/Current Home Services: No current home services Social Drivers of Health Review: SDOH reviewed no interventions necessary Readmission risk has been reviewed: Yes Transition of care needs: no transition of care needs at this time

## 2024-08-13 NOTE — Plan of Care (Signed)
  Problem: Health Behavior/Discharge Planning: Goal: Ability to manage health-related needs will improve Outcome: Progressing   Problem: Clinical Measurements: Goal: Ability to maintain clinical measurements within normal limits will improve Outcome: Not Progressing  Ambulating well in hallway. Still working on glucose stabilization.

## 2024-08-13 NOTE — Progress Notes (Signed)
 PROGRESS NOTE  Peggy Warren FMW:[id number] DOB: [birth year]   PCP: [name], MD  Patient is from: Home.  Independently ambulates at baseline.  DOA: 08/10/2024 LOS: 3  Chief complaints Elevated blood glucose    Brief Narrative / Interim history: 58 year old F with PMH of COPD, lung cancer, DM-2, HTN, tobacco use disorder and GERD presented to ED with high blood glucose, and admitted with DKA.  Patient stopped Ozempic  2 to 3 weeks ago due to significantly reduced appetite and discomfort with eating.  She says she was taken off insulin  a long time ago since her A1c was good.  She admits to dietary indiscretion.  DKA resolved overnight and she was transition to subcu insulin .  Titrating insulin  for hyperglycemia.  Subjective: Seen and examined earlier this morning.  No major events overnight or this morning.  CBG remains elevated this morning but not pre-meal.  Some delay in morning insulin  coverage as well.   Assessment and plan: Poorly controlled DM-2 with DKA and hyperglycemia: HHS ruled out.  Last A1c 6.1% on 8/12.  Stopped Ozempic  due to significantly decreased appetite and discomfort.  DKA resolved but significant hyperglycemia although postprandial.. Recent Labs  Lab 08/12/24 1702 08/12/24 2110 08/13/24 0114 08/13/24 0631 08/13/24 0844  GLUCAP 199* 279* 269* 286* 469*  -Continue SSI-resistant scale -Increase NovoLog  from 7-10 units 3 times daily with meals -Increase Semglee  to 35 units twice daily -Continue low-dose metformin  500 mg twice daily -Discussed with RN about importance of timely CBG and insulin  administration -Appreciate input by diabetic coordinator. -Needs teaching, diabetic supplies and insulin  on discharge.   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 S/p robot-assisted right lobectomy on 9/11.  She was to start chemotherapy on 10/29 after education - Patient follow-up with oncology.  Essential hypertension: Normotensive -Lisinopril   increased from 5 to 20 mg on admission.   Tobacco use disorder: Reports smoking about a pack a day.  Started Chantix  - Continue on Chantix .  Chronic COPD: Stable - Continue home inhalers.  History of polysubstance use: Sober.  Hypokalemia/hypomagnesemia -Monitor replenish K and Mg as appropriate  Left upper eyelid stye - Recommended frequent warm compress  Class I obesity Body mass index is 32.95 kg/m.          DVT prophylaxis:  SCDs Start: 08/10/24 1547  Code Status: Full code Family Communication: Updated patient's daughter from patient's phone. Level of care: Med-Surg Status is: Inpatient Remains inpatient appropriate because: Poorly controlled diabetes with hyperglycemia   Final disposition: Home   55 minutes with more than 50% spent in reviewing records, counseling patient/family and coordinating care.  Consultants:  Diabetic coordinator  Procedures: None  Microbiology summarized: None  Objective: Vitals:   08/12/24 2009 08/13/24 0440 08/13/24 0841 08/13/24 0845  BP: 112/75 115/78  117/64  Pulse: 68 65  68  Resp: 18 20  20   Temp: 98.1 F (36.7 C) (!) 97.3 F (36.3 C)  97.9 F (36.6 C)  TempSrc: Oral Oral  Oral  SpO2: 97% 96% 95% 96%  Weight:      Height:        Examination:  GENERAL: No apparent distress.  Nontoxic. HEENT: MMM.  Vision and hearing grossly intact.  Stye over left upper eyelid. NECK: Supple.  No apparent JVD.  RESP:  No IWOB.  Fair aeration bilaterally. CVS:  RRR. Heart sounds normal.  ABD/GI/GU: BS+. Abd soft, NTND.  MSK/EXT:  Moves extremities. No apparent deformity. No edema.  SKIN: no apparent skin lesion or  wound NEURO: AA.  Oriented appropriately.  No apparent focal neuro deficit. PSYCH: Calm. Normal affect.   Sch Meds:  Scheduled Meds:  amLODipine   10 mg Oral Daily   aspirin  EC  81 mg Oral Daily   atorvastatin   40 mg Oral Daily   insulin  aspart  0-20 Units Subcutaneous TID WC   insulin  aspart  0-5 Units  Subcutaneous QHS   insulin  aspart  10 Units Subcutaneous TID WC   insulin  glargine-yfgn  35 Units Subcutaneous BID   lisinopril   20 mg Oral Daily   metFORMIN   500 mg Oral BID WC   umeclidinium-vilanterol  1 puff Inhalation Daily   Continuous Infusions:   PRN Meds:.acetaminophen , dextrose , ipratropium-albuterol , mouth rinse  Antimicrobials: Anti-infectives (From admission, onward)    Start     Dose/Rate Route Frequency Ordered Stop   08/10/24 1630  fluconazole  (DIFLUCAN ) tablet 150 mg        150 mg Oral  Once 08/10/24 1623 08/10/24 1830        I have personally reviewed the following labs and images: CBC: Recent Labs  Lab 08/10/24 1249 08/10/24 1259 08/10/24 1427 08/11/24 0355 08/12/24 0300 08/13/24 0743  WBC 7.9  --   --  6.9 5.6 5.7  HGB 14.0 15.3* 15.3* 12.4 12.6 13.1  HCT 42.5 45.0 45.0 36.2 37.7 39.3  MCV 95.3  --   --  94.5 95.7 96.1  PLT 392  --   --  323 319 311   BMP &GFR Recent Labs  Lab 08/10/24 2236 08/11/24 0355 08/11/24 0800 08/12/24 0300 08/13/24 0743  NA 133* 136 135 135 134*  K 4.6 3.5 4.7 4.1 4.0  CL 101 105 102 103 100  CO2 19* 21* 20* 22 22  GLUCOSE 219* 195* 295* 299* 308*  BUN <5* <5* <5* <5* 6  CREATININE 0.50 0.43* 0.50 0.52 0.54  CALCIUM  8.8* 9.1 9.0 9.0 8.9  MG  --  1.4*  --  1.6* 1.8  PHOS  --   --   --  4.7* 4.5   Estimated Creatinine Clearance: 73.6 mL/min (by C-G formula based on SCr of 0.54 mg/dL). Liver & Pancreas: Recent Labs  Lab 08/12/24 0300 08/13/24 0743  ALBUMIN  3.1* 3.4*   No results for input(s): LIPASE, AMYLASE in the last 168 hours. No results for input(s): AMMONIA in the last 168 hours. Diabetic: Recent Labs    08/11/24 0351  HGBA1C 12.5*   Recent Labs  Lab 08/12/24 1702 08/12/24 2110 08/13/24 0114 08/13/24 0631 08/13/24 0844  GLUCAP 199* 279* 269* 286* 469*   Cardiac Enzymes: No results for input(s): CKTOTAL, CKMB, CKMBINDEX, TROPONINI in the last 168 hours. No results for  input(s): PROBNP in the last 8760 hours. Coagulation Profile: No results for input(s): INR, PROTIME in the last 168 hours. Thyroid  Function Tests: No results for input(s): TSH, T4TOTAL, FREET4, T3FREE, THYROIDAB in the last 72 hours. Lipid Profile: No results for input(s): CHOL, HDL, LDLCALC, TRIG, CHOLHDL, LDLDIRECT in the last 72 hours. Anemia Panel: No results for input(s): VITAMINB12, FOLATE, FERRITIN, TIBC, IRON, RETICCTPCT in the last 72 hours. Urine analysis:    Component Value Date/Time   COLORURINE STRAW (A) 08/10/2024 1255   APPEARANCEUR CLEAR 08/10/2024 1255   LABSPEC 1.029 08/10/2024 1255   PHURINE 5.0 08/10/2024 1255   GLUCOSEU >=500 (A) 08/10/2024 1255   GLUCOSEU NEGATIVE 08/25/2023 1125   HGBUR SMALL (A) 08/10/2024 1255   BILIRUBINUR NEGATIVE 08/10/2024 1255   KETONESUR 80 (A) 08/10/2024 1255   PROTEINUR  30 (A) 08/10/2024 1255   UROBILINOGEN 0.2 08/25/2023 1125   NITRITE NEGATIVE 08/10/2024 1255   LEUKOCYTESUR NEGATIVE 08/10/2024 1255   Sepsis Labs: Invalid input(s): PROCALCITONIN, LACTICIDVEN  Microbiology: No results found for this or any previous visit (from the past 240 hours).  Radiology Studies: No results found.    Kamuela Magos T. Connor Foxworthy [facility]  If 7PM-7AM, please contact night-coverage [email or web address] 08/13/2024, 10:56 AM

## 2024-08-13 NOTE — Telephone Encounter (Signed)
 Patient Product/process Development Scientist completed.    The patient is insured through [employer].     Ran test claim for Lantus  Pen and the current 30 day co-pay is $4.00.  Ran test claim for insulin  Aspart Flex Pen and the current 30 day co-pay is $4.00.  This test claim was processed through [facility]- copay amounts may vary at other pharmacies due to pharmacy/plan contracts, or as the patient moves through the different stages of their insurance plan.     Reyes Sharps, CPHT Pharmacy Technician Patient Advocate Specialist Lead [facility] Patient Advocate Team Direct Number: [phone]  Fax: [phone]

## 2024-08-14 ENCOUNTER — Other Ambulatory Visit (HOSPITAL_COMMUNITY): Payer: Self-pay

## 2024-08-14 ENCOUNTER — Encounter: Payer: Self-pay | Admitting: Internal Medicine

## 2024-08-14 DIAGNOSIS — E1110 Type 2 diabetes mellitus with ketoacidosis without coma: Secondary | ICD-10-CM | POA: Diagnosis not present

## 2024-08-14 LAB — GLUCOSE, CAPILLARY
Glucose-Capillary: 164 mg/dL — ABNORMAL HIGH (ref 70–99)
Glucose-Capillary: 232 mg/dL — ABNORMAL HIGH (ref 70–99)
Glucose-Capillary: 247 mg/dL — ABNORMAL HIGH (ref 70–99)

## 2024-08-14 LAB — HEMOGLOBIN A1C
Hgb A1c MFr Bld: 11.9 % — ABNORMAL HIGH (ref 4.8–5.6)
Mean Plasma Glucose: 295 mg/dL

## 2024-08-14 MED ORDER — FREESTYLE LIBRE 3 PLUS SENSOR MISC
3 refills | Status: DC
  Filled 2024-08-14: qty 6, 90d supply, fill #0

## 2024-08-14 MED ORDER — NOVOLOG FLEXPEN 100 UNIT/ML ~~LOC~~ SOPN
13.0000 [IU] | PEN_INJECTOR | Freq: Three times a day (TID) | SUBCUTANEOUS | 11 refills | Status: AC
  Filled 2024-08-14: qty 15, 30d supply, fill #0

## 2024-08-14 MED ORDER — INSULIN GLARGINE 100 UNIT/ML SOLOSTAR PEN
35.0000 [IU] | PEN_INJECTOR | Freq: Two times a day (BID) | SUBCUTANEOUS | 11 refills | Status: AC
  Filled 2024-08-14: qty 30, 43d supply, fill #0

## 2024-08-14 MED ORDER — INSULIN PEN NEEDLE 32G X 4 MM MISC
1.0000 | Freq: Every day | 1 refills | Status: AC
  Filled 2024-08-14: qty 100, 20d supply, fill #0

## 2024-08-14 MED ORDER — FREESTYLE LIBRE 3 READER DEVI
0 refills | Status: AC
  Filled 2024-08-14: qty 1, 3d supply, fill #0

## 2024-08-14 MED ORDER — METFORMIN HCL 500 MG PO TABS
ORAL_TABLET | ORAL | 0 refills | Status: AC
  Filled 2024-08-14: qty 90, 30d supply, fill #0

## 2024-08-14 NOTE — Inpatient Diabetes Management (Addendum)
 Inpatient Diabetes Program Recommendations  AACE/ADA: New Consensus Statement on Inpatient Glycemic Control   Target Ranges:  Prepandial:   less than 140 mg/dL      Peak postprandial:   less than 180 mg/dL (1-2 hours)      Critically ill patients:  140 - 180 mg/dL   Lab Results  Component Value Date   GLUCAP 232 (H) 08/14/2024   HGBA1C 12.5 (H) 08/11/2024    Latest Reference Range & Units 08/13/24 08:44 08/13/24 11:43 08/13/24 16:45 08/13/24 19:57 08/14/24 06:19 08/14/24 07:29  Glucose-Capillary 70 - 99 mg/dL 530 (H) 705 (H) 897 (H) 204 (H) 164 (H) 232 (H)   Review of Glycemic Control  Diabetes history:DM2   Outpatient Diabetes medications:  Ozempic   (has not taking in over 2 weeks)    Current orders for Inpatient glycemic control:  Semglee  35 units BID  Novolog  0-20 units TID + 0-5 units at bedtime  Novolog  10 units TID with meals  Metformin  500mg  BID   Inpatient Diabetes Program Recommendations:   Please consider:  - Increase Semglee  to 40 units BID - Changing Novolog  correction to 0-15  units TID + 0-5 units at bedtime - Increasing Novolog  meal coverage to 12 units TID (if pt consumes atleast 50% of meal).   Per [facility] pharmacy, Freestyle Libre 3 Plus has been APPROVED with $0 copay.  Patient wishes to use CGM per conversation yesterday but wants to use Reader instead of smart phone to view CBG readings.   Please send RX for [employer] [phone]) and Sensors [phone]) to [facility] pharmacy and diabetes coordinator can assist patient with setting up.  Addendum:  Picked up medications from [facility] pharmacy and delivered to patient in room.  Educated patient at bedside and patient's daughter (over the phone) on insulin  pen use at home. Reviewed all steps of insulin  pen including attachment of needle, 2-unit air shot, dialing up dose, giving injection, removing needle, disposal of sharps, storage of unused insulin , disposal of insulin  etc. Patient able to provide successful  return demonstration. Also reviewed troubleshooting with insulin  pen.  Educated patient on FreeStyle Libre3 CGM regarding application and changing CGM sensor (alternate every 15 days on back of arms), 1 hour warm-up, how to scan sensor to start a new sensor, and how to use app to check glucose.  Encouraged patient watch YouTube video on how to apply FreeStyle Libre3 sensor. Patient has FreeStyle Hydrologist from [employer]. Provided educational packet regarding FreeStyle Libre3 CGM. Asked patient to be sure to let PCP know about Liane and allow provider to review reports from Libre3 app so the provider can use the information to continue to make adjustments with DM medications if needed. Assisted patient with applying sensor to left upper arm and set up reader. Patient verbalized understanding of information and has no questions at this time.   Thanks,  Lavanda Search, RN, MSN, [facility]  Inpatient Diabetes Coordinator  Pager [phone] (8a-5p)

## 2024-08-14 NOTE — TOC Transition Note (Signed)
 Transition of Care [facility]) - Discharge Note   Patient Details  Name: Peggy Warren MRN: [id number] Date of Birth: 1965-11-06  Transition of Care [facility]) CM/SW Contact:  Tom-Johnson, Harvest Muskrat, RN Phone Number: 08/14/2024, 11:03 AM   Clinical Narrative:     Patient is scheduled for discharge today.  Readmission Risk Assessment done. Outpatient f/u, hospital f/u and discharge instructions on AVS. Prescriptions sent to [facility] pharmacy including freestyle Libre reader and Sensors and patient Peggy receive meds prior discharge. Education done by Diabetes Coordinator.  No ICM needs or recommendations noted. Daughter, [name] to transport at discharge.  No further ICM needs noted.       Final next level of care: Home/Self Care Barriers to Discharge: Barriers Resolved   Patient Goals and CMS Choice Patient states their goals for this hospitalization and ongoing recovery are:: To return home CMS Medicare.gov Compare Post Acute Care list provided to:: Patient Choice offered to / list presented to : Patient      Discharge Placement                Patient to be transferred to facility by: Daughter Name of family member notified: [facility]    Discharge Plan and Services Additional resources added to the After Visit Summary for                  DME Arranged: N/A DME Agency: NA       HH Arranged: NA HH Agency: NA        Social Drivers of Health (SDOH) Interventions SDOH Screenings   Food Insecurity: No Food Insecurity (08/10/2024)  Recent Concern: Food Insecurity - Food Insecurity Present (05/23/2024)  Housing: Low Risk  (08/10/2024)  Transportation Needs: No Transportation Needs (08/10/2024)  Utilities: Not At Risk (08/10/2024)  Depression (PHQ2-9): Low Risk  (07/25/2024)  Tobacco Use: Medium Risk (08/10/2024)     Readmission Risk Interventions    08/13/2024   10:17 AM 06/25/2024   12:45 PM  Readmission Risk Prevention Plan  Transportation Screening  Complete Complete  PCP or Specialist Appt within 5-7 Days Complete Complete  Home Care Screening Complete Complete  Medication Review (RN CM) Referral to Pharmacy Complete

## 2024-08-14 NOTE — Progress Notes (Signed)
 DISCHARGE NOTE HOME Peggy Warren to be discharged Home per MD order. Discussed prescriptions and follow up appointments with the patient. Prescriptions given to patient; medication list explained in detail. Patient verbalized understanding.  Skin clean, dry and intact without evidence of skin break down, no evidence of skin tears noted. IV catheter discontinued intact. Site without signs and symptoms of complications. Dressing and pressure applied. Pt denies pain at the site currently. No complaints noted.  Patient free of lines, drains, and wounds.   An After Visit Summary (AVS) was printed and given to the patient. Patient escorted via wheelchair, and discharged home via private auto.  [name] A Proctor-Gann, RN

## 2024-08-14 NOTE — Discharge Summary (Signed)
 Physician Discharge Summary  Peggy Warren FMW:[id number] DOB: 1966/04/28 DOA: 08/10/2024  PCP: Peggy Lynwood ORN, MD  Admit date: 08/10/2024 Discharge date: 08/14/24  Admitted From: Home Disposition: Home Recommendations for Outpatient Follow-up:  Outpatient follow-up with PCP in 1 to 2 weeks Outpatient follow-up with oncology as previously planned Assess glycemic control, CMP and CBC at follow-up Please follow up on the following pending results: None  Home Health: No need identified Equipment/Devices: No need identified  Discharge Condition: Stable CODE STATUS: Full code Diet Orders (From admission, onward)     Start     Ordered   08/11/24 0454  Diet heart healthy/carb modified Room service appropriate? Yes; Fluid consistency: Thin  Diet effective now       Question Answer Comment  Diet-HS Snack? Nothing   Room service appropriate? Yes   Fluid consistency: Thin      08/11/24 0453             Follow-up Information     Peggy Lynwood ORN, MD. Schedule an appointment as soon as possible for a visit in 1 week(s).   Specialties: Internal Medicine, Radiology Contact information: [address] [place] [address] [phone]                 Hospital course 58 year old F with PMH of COPD, lung cancer, DM-2, HTN, tobacco use disorder and GERD presented to ED with high blood glucose, and admitted with DKA.  Patient stopped Ozempic  2 to 3 weeks ago due to significantly reduced appetite and discomfort with eating.  She says she was taken off insulin  a long time ago since her A1c was good.  She admits to dietary indiscretion.   DKA resolved overnight and she was transition to subcu insulin .  Hemoglobin A1c 12.5% (was 6.1% on 05/22/2024).  Hyperglycemia improved after titration of insulin  and addition of metformin .  Patient is discharged on Lantus  35 units twice daily, NovoLog  13 units 3 times daily with meals and metformin  500 mg twice daily for 2 weeks followed by  1000 mg twice daily.  She will have freestyle libre for CBG monitoring.  Extensively counseled on cutting down carbohydrates/sugary foods and drinks.  Provided with resources.  See individual problem list below for more.   Problems addressed during this hospitalization Poorly controlled DM-2 with DKA and hyperglycemia: HHS ruled out.  Last A1c 12.5% (was 6.1% on 8/12).  Stopped Ozempic  due to significantly decreased appetite and discomfort.  Hyperglycemia improved after titration of insulin  and addition of metformin  -Insulin  and metformin  as above. -Outpatient follow-up with PCP in 1 to 2 weeks. -Extensively counseled and provided with resources.   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 S/p robot-assisted right lobectomy on 9/11.  She was to start chemotherapy on 10/29 after education - Outpatient follow-up with oncology.   Essential hypertension: Normotensive - Continue home lisinopril .   Tobacco use disorder: Reports smoking about a pack a day.  Started Chantix  - Continue home Chantix .   Chronic COPD: Stable - Continue home inhalers.   History of polysubstance use: Sober.   Hypokalemia/hypomagnesemia: Resolved.   Left upper eyelid stye - Recommended frequent warm compress   Class I obesity Body mass index is 32.95 kg/m.           Consultations: Diabetic coordinator  Time spent 35  minutes  Vital signs Vitals:   08/13/24 1639 08/13/24 1955 08/14/24 0503 08/14/24 0845  BP: 115/76 122/83 106/66 114/70  Pulse: 75 66 60 62  Temp:  98.2 F (36.8 C) 98.1 F (36.7 C) 98.3 F (36.8 C) 98.6 F (37 C)  Resp: 18 18 18 18   Height:      Weight:      SpO2: 95% 95% 96% 100%  TempSrc: Oral  Oral   BMI (Calculated):         Discharge exam  GENERAL: No apparent distress.  Nontoxic. HEENT: MMM.  Vision and hearing grossly intact.  NECK: Supple.  No apparent JVD.  RESP:  No IWOB.  Fair aeration bilaterally. CVS:  RRR. Heart sounds normal.  ABD/GI/GU: BS+.  Abd soft, NTND.  MSK/EXT:  Moves extremities. No apparent deformity. No edema.  SKIN: no apparent skin lesion or wound NEURO: Awake and alert. Oriented appropriately.  No apparent focal neuro deficit. PSYCH: Calm. Normal affect.   Discharge Instructions Discharge Instructions     Discharge instructions   Complete by: As directed    It has been a pleasure taking care of you!  You were hospitalized due to poorly controlled diabetes for which you have been treated.  We have started you on insulin  and metformin .  Is very important that you take your medications as prescribed.  It is equal important that you watch your diet and minimize foods high in carbohydrates/sugar.  Please see a separate discharge instruction about management of diabetes.  Follow-up with your primary care doctor in 1 to 2 weeks as soon as possible.   Take care,   Increase activity slowly   Complete by: As directed    No wound care   Complete by: As directed       Allergies as of 08/14/2024   No Known Allergies      Medication List     STOP taking these medications    Ozempic  (0.25 or 0.5 MG/DOSE) 2 MG/3ML Sopn Generic drug: Semaglutide (0.25 or 0.5MG /DOS)       TAKE these medications    acetaminophen  500 MG tablet Commonly known as: TYLENOL  Take 500-1,000 mg by mouth every 6 (six) hours as needed (pain.).   albuterol  108 (90 Base) MCG/ACT inhaler Commonly known as: VENTOLIN  HFA Inhale 1-2 puffs into the lungs every 6 (six) hours as needed for wheezing or shortness of breath.   amLODipine  10 MG tablet Commonly known as: NORVASC  Take 1 tablet (10 mg total) by mouth daily.   aspirin  EC 81 MG tablet Take 1 tablet (81 mg total) by mouth daily. Swallow whole.   atorvastatin  40 MG tablet Commonly known as: LIPITOR Take 1 tablet (40 mg total) by mouth daily.   calcium  carbonate 500 MG chewable tablet Commonly known as: TUMS - dosed in mg elemental calcium  Chew 2 tablets by mouth as needed for  indigestion or heartburn.   FreeStyle Libre 3 Plus Sensor Misc Change sensor every 15 days. What changed: additional instructions   FreeStyle Libre 3 Reader Devi Use to monitor your blood glucose   Insulin  Aspart FlexPen 100 UNIT/ML Commonly known as: NOVOLOG  Inject 13 Units into the skin 3 (three) times daily before meals.   Lantus  SoloStar 100 UNIT/ML Solostar Pen Generic drug: insulin  glargine Inject 35 Units into the skin 2 (two) times daily.   lisinopril  5 MG tablet Commonly known as: ZESTRIL  Take 1 tablet (5 mg total) by mouth daily.   metFORMIN  500 MG tablet Commonly known as: GLUCOPHAGE  Take 1 tablet (500 mg total) by mouth 2 (two) times daily with a meal for 14 days, THEN 2 tablets (1,000 mg total) 2 (two) times daily with  a meal. Start taking on: August 14, 2024   methocarbamol  500 MG tablet Commonly known as: ROBAXIN  Take 1 tablet (500 mg total) by mouth every 8 (eight) hours as needed for muscle spasms.   multivitamin tablet Take 1 tablet by mouth as needed.   ondansetron  8 MG tablet Commonly known as: Zofran  Take 1 tablet (8 mg total) by mouth every 8 (eight) hours as needed for nausea or vomiting. Start on the third day after carboplatin.   prochlorperazine 10 MG tablet Commonly known as: COMPAZINE Take 1 tablet (10 mg total) by mouth every 6 (six) hours as needed.   TechLite Pen Needles 32G X 4 MM Misc Generic drug: Insulin  Pen Needle Use 5 (five) times daily.   umeclidinium-vilanterol 62.5-25 MCG/ACT Aepb Commonly known as: Anoro Ellipta  Inhale 1 puff into the lungs daily.   varenicline  1 MG tablet Commonly known as: Chantix  Continuing Month Pak Take 1 tablet (1 mg total) by mouth 2 (two) times daily. What changed:  when to take this additional instructions         Procedures/Studies:   DG Chest 2 View Result Date: 08/10/2024 EXAM: 2 VIEW(S) XRAY OF THE CHEST 08/10/2024 10:46:00 AM COMPARISON: 06/25/2024 CLINICAL HISTORY: lung mass  FINDINGS: LUNGS AND PLEURA: No focal pulmonary opacity. No pulmonary edema. No pleural effusion. No pneumothorax. HEART AND MEDIASTINUM: No acute abnormality of the cardiac and mediastinal silhouettes. BONES AND SOFT TISSUES: No acute osseous abnormality. IMPRESSION: 1. No acute cardiopulmonary disease. Electronically signed by: Lynwood Seip MD 08/10/2024 11:20 AM EDT RP Workstation: [id number]       The results of significant diagnostics from this hospitalization (including imaging, microbiology, ancillary and laboratory) are listed below for reference.     Microbiology: No results found for this or any previous visit (from the past 240 hours).   Labs:  CBC: Recent Labs  Lab 08/10/24 1249 08/10/24 1259 08/10/24 1427 08/11/24 0355 08/12/24 0300 08/13/24 0743  WBC 7.9  --   --  6.9 5.6 5.7  HGB 14.0 15.3* 15.3* 12.4 12.6 13.1  HCT 42.5 45.0 45.0 36.2 37.7 39.3  MCV 95.3  --   --  94.5 95.7 96.1  PLT 392  --   --  323 319 311   BMP &GFR Recent Labs  Lab 08/10/24 2236 08/11/24 0355 08/11/24 0800 08/12/24 0300 08/13/24 0743  NA 133* 136 135 135 134*  K 4.6 3.5 4.7 4.1 4.0  CL 101 105 102 103 100  CO2 19* 21* 20* 22 22  GLUCOSE 219* 195* 295* 299* 308*  BUN <5* <5* <5* <5* 6  CREATININE 0.50 0.43* 0.50 0.52 0.54  CALCIUM  8.8* 9.1 9.0 9.0 8.9  MG  --  1.4*  --  1.6* 1.8  PHOS  --   --   --  4.7* 4.5   Estimated Creatinine Clearance: 73.6 mL/min (by C-G formula based on SCr of 0.54 mg/dL). Liver & Pancreas: Recent Labs  Lab 08/12/24 0300 08/13/24 0743  ALBUMIN  3.1* 3.4*   No results for input(s): LIPASE, AMYLASE in the last 168 hours. No results for input(s): AMMONIA in the last 168 hours. Diabetic: Recent Labs    08/13/24 0743  HGBA1C 11.9*   Recent Labs  Lab 08/13/24 1645 08/13/24 1957 08/14/24 0619 08/14/24 0729 08/14/24 1141  GLUCAP 102* 204* 164* 232* 247*   Cardiac Enzymes: No results for input(s): CKTOTAL, CKMB, CKMBINDEX,  TROPONINI in the last 168 hours. No results for input(s): PROBNP in the last 8760 hours. Coagulation Profile:  No results for input(s): INR, PROTIME in the last 168 hours. Thyroid  Function Tests: No results for input(s): TSH, T4TOTAL, FREET4, T3FREE, THYROIDAB in the last 72 hours. Lipid Profile: No results for input(s): CHOL, HDL, LDLCALC, TRIG, CHOLHDL, LDLDIRECT in the last 72 hours. Anemia Panel: No results for input(s): VITAMINB12, FOLATE, FERRITIN, TIBC, IRON, RETICCTPCT in the last 72 hours. Urine analysis:    Component Value Date/Time   COLORURINE STRAW (A) 08/10/2024 1255   APPEARANCEUR CLEAR 08/10/2024 1255   LABSPEC 1.029 08/10/2024 1255   PHURINE 5.0 08/10/2024 1255   GLUCOSEU >=500 (A) 08/10/2024 1255   GLUCOSEU NEGATIVE 08/25/2023 1125   HGBUR SMALL (A) 08/10/2024 1255   BILIRUBINUR NEGATIVE 08/10/2024 1255   KETONESUR 80 (A) 08/10/2024 1255   PROTEINUR 30 (A) 08/10/2024 1255   UROBILINOGEN 0.2 08/25/2023 1125   NITRITE NEGATIVE 08/10/2024 1255   LEUKOCYTESUR NEGATIVE 08/10/2024 1255   Sepsis Labs: Invalid input(s): PROCALCITONIN, LACTICIDVEN   SIGNED:  Aryanna Shaver T Addasyn Mcbreen, MD  [facility] 08/14/2024, 2:17 PM

## 2024-08-14 NOTE — Plan of Care (Signed)
  Problem: Education: Goal: Knowledge of General Education information will improve Description: Including pain rating scale, medication(s)/side effects and non-pharmacologic comfort measures Outcome: Adequate for Discharge   Problem: Health Behavior/Discharge Planning: Goal: Ability to manage health-related needs will improve Outcome: Adequate for Discharge   Problem: Clinical Measurements: Goal: Ability to maintain clinical measurements within normal limits will improve Outcome: Adequate for Discharge Goal: Will remain free from infection Outcome: Adequate for Discharge Goal: Diagnostic test results will improve Outcome: Adequate for Discharge Goal: Respiratory complications will improve Outcome: Adequate for Discharge Goal: Cardiovascular complication will be avoided Outcome: Adequate for Discharge   Problem: Activity: Goal: Risk for activity intolerance will decrease Outcome: Adequate for Discharge   Problem: Nutrition: Goal: Adequate nutrition will be maintained Outcome: Adequate for Discharge   Problem: Coping: Goal: Level of anxiety will decrease Outcome: Adequate for Discharge   

## 2024-08-14 NOTE — Plan of Care (Signed)
   Problem: Fluid Volume: Goal: Ability to maintain a balanced intake and output will improve Outcome: Progressing

## 2024-08-15 ENCOUNTER — Telehealth: Payer: Self-pay | Admitting: Internal Medicine

## 2024-08-15 ENCOUNTER — Other Ambulatory Visit: Payer: Self-pay

## 2024-08-15 ENCOUNTER — Telehealth: Payer: Self-pay

## 2024-08-15 ENCOUNTER — Other Ambulatory Visit: Payer: Self-pay | Admitting: Physician Assistant

## 2024-08-15 DIAGNOSIS — E119 Type 2 diabetes mellitus without complications: Secondary | ICD-10-CM | POA: Diagnosis not present

## 2024-08-15 NOTE — Transitions of Care (Post Inpatient/ED Visit) (Signed)
   08/15/2024  Name: Peggy Warren MRN: [id number] DOB: 13-Sep-1966  Today's TOC FU Call Status: Today's TOC FU Call Status:: Successful TOC FU Call Completed TOC FU Call Complete Date: 08/15/24 Patient's Name and Date of Birth confirmed.  Transition Care Management Follow-up Telephone Call Date of Discharge: 08/14/24 Discharge Facility: [facility]) Type of Discharge: Inpatient Admission Primary Inpatient Discharge Diagnosis:: DKA How have you been since you were released from the hospital?: Better Any questions or concerns?: No  Items Reviewed: Did you receive and understand the discharge instructions provided?: Yes Medications obtained,verified, and reconciled?: Yes (Medications Reviewed) Any new allergies since your discharge?: No Dietary orders reviewed?: NA Do you have support at home?: Yes People in Home [RPT]: alone Name of Support/Comfort Primary Source: [name]  Medications Reviewed Today: Medications Reviewed Today   Medications were not reviewed in this encounter     Home Care and Equipment/Supplies: Were Home Health Services Ordered?: No Any new equipment or medical supplies ordered?: No  Functional Questionnaire: Do you need assistance with bathing/showering or dressing?: No Do you need assistance with meal preparation?: No Do you need assistance with eating?: No Do you have difficulty maintaining continence: No Do you need assistance with getting out of bed/getting out of a chair/moving?: No Do you have difficulty managing or taking your medications?: No  Follow up appointments reviewed: PCP Follow-up appointment confirmed?: Yes Date of PCP follow-up appointment?: 08/21/24 Follow-up Provider: Lynwood Norleen Driscilla Lionel Follow-up appointment confirmed?: NA Do you need transportation to your follow-up appointment?: No Do you understand care options if your condition(s) worsen?: Yes-patient verbalized understanding    Medford Balboa,  BSN, RN [place]  [facility] - Population Health RN Care Manager [phone]
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
## 2024-08-16 DIAGNOSIS — E119 Type 2 diabetes mellitus without complications: Secondary | ICD-10-CM | POA: Diagnosis not present

## 2024-08-16 NOTE — Progress Notes (Signed)
 Pharmacist Chemotherapy Monitoring - Initial Assessment    Anticipated start date: 08/22/24   The following has been reviewed per standard work regarding the patient's treatment regimen: The patient's diagnosis, treatment plan and drug doses, and organ/hematologic function Lab orders and baseline tests specific to treatment regimen  The treatment plan start date, drug sequencing, and pre-medications Prior authorization status  Patient's documented medication list, including drug-drug interaction screen and prescriptions for anti-emetics and supportive care specific to the treatment regimen The drug concentrations, fluid compatibility, administration routes, and timing of the medications to be used The patient's access for treatment and lifetime cumulative dose history, if applicable  The patient's medication allergies and previous infusion related reactions, if applicable   Changes made to treatment plan:  N/A  Follow up needed:  N/A  Peggy Warren, RPH, 08/16/2024  12:46 PM

## 2024-08-17 ENCOUNTER — Inpatient Hospital Stay: Admitting: Licensed Clinical Social Worker

## 2024-08-17 ENCOUNTER — Inpatient Hospital Stay: Attending: Physician Assistant

## 2024-08-17 DIAGNOSIS — C3411 Malignant neoplasm of upper lobe, right bronchus or lung: Secondary | ICD-10-CM

## 2024-08-17 DIAGNOSIS — F17200 Nicotine dependence, unspecified, uncomplicated: Secondary | ICD-10-CM | POA: Insufficient documentation

## 2024-08-17 DIAGNOSIS — R739 Hyperglycemia, unspecified: Secondary | ICD-10-CM

## 2024-08-17 DIAGNOSIS — R0789 Other chest pain: Secondary | ICD-10-CM | POA: Insufficient documentation

## 2024-08-17 DIAGNOSIS — Z5111 Encounter for antineoplastic chemotherapy: Secondary | ICD-10-CM | POA: Insufficient documentation

## 2024-08-17 DIAGNOSIS — R059 Cough, unspecified: Secondary | ICD-10-CM | POA: Insufficient documentation

## 2024-08-17 DIAGNOSIS — Z902 Acquired absence of lung [part of]: Secondary | ICD-10-CM | POA: Insufficient documentation

## 2024-08-17 DIAGNOSIS — Z5189 Encounter for other specified aftercare: Secondary | ICD-10-CM | POA: Insufficient documentation

## 2024-08-17 NOTE — Progress Notes (Signed)
 [facility] CSW Progress Note  Clinical Child Psychotherapist contacted patient by phone to follow-up on distress screen needs.    Interventions: CSW spoke w/ pt following chemo education.  Pt with a distress screen score of 7.  Pt reports chemo education was overwhelming, but at present she is okay.  Pt is anxious about starting chemotherapy, but reports she has support.  Pt reminded of lung cancer support group and availability of individual counseling.  Pt declining at this time, but has contact information for CSW should she wish to pursue either in the future.       Follow Up Plan:  Patient will contact CSW with any support or resource needs    Peggy JONELLE Manna, LCSW Clinical Social Worker [facility]    Patient is participating in a Managed Medicaid Plan:  Yes

## 2024-08-20 DIAGNOSIS — E119 Type 2 diabetes mellitus without complications: Secondary | ICD-10-CM | POA: Diagnosis not present

## 2024-08-21 ENCOUNTER — Ambulatory Visit: Admitting: Internal Medicine

## 2024-08-21 ENCOUNTER — Inpatient Hospital Stay: Admitting: Internal Medicine

## 2024-08-21 ENCOUNTER — Encounter: Payer: Self-pay | Admitting: Internal Medicine

## 2024-08-21 ENCOUNTER — Inpatient Hospital Stay

## 2024-08-21 VITALS — BP 143/77 | HR 80 | Temp 98.0°F | Resp 17 | Ht 65.1 in

## 2024-08-21 VITALS — BP 146/82 | HR 83 | Temp 98.4°F | Ht 65.0 in | Wt 181.0 lb

## 2024-08-21 DIAGNOSIS — Z5189 Encounter for other specified aftercare: Secondary | ICD-10-CM | POA: Diagnosis not present

## 2024-08-21 DIAGNOSIS — Z794 Long term (current) use of insulin: Secondary | ICD-10-CM | POA: Diagnosis not present

## 2024-08-21 DIAGNOSIS — C3411 Malignant neoplasm of upper lobe, right bronchus or lung: Secondary | ICD-10-CM | POA: Diagnosis not present

## 2024-08-21 DIAGNOSIS — F17200 Nicotine dependence, unspecified, uncomplicated: Secondary | ICD-10-CM | POA: Diagnosis not present

## 2024-08-21 DIAGNOSIS — E119 Type 2 diabetes mellitus without complications: Secondary | ICD-10-CM | POA: Diagnosis not present

## 2024-08-21 DIAGNOSIS — R0789 Other chest pain: Secondary | ICD-10-CM | POA: Diagnosis not present

## 2024-08-21 DIAGNOSIS — M2011 Hallux valgus (acquired), right foot: Secondary | ICD-10-CM | POA: Insufficient documentation

## 2024-08-21 DIAGNOSIS — Z902 Acquired absence of lung [part of]: Secondary | ICD-10-CM | POA: Diagnosis not present

## 2024-08-21 DIAGNOSIS — R059 Cough, unspecified: Secondary | ICD-10-CM | POA: Diagnosis not present

## 2024-08-21 DIAGNOSIS — M2012 Hallux valgus (acquired), left foot: Secondary | ICD-10-CM | POA: Insufficient documentation

## 2024-08-21 DIAGNOSIS — Z5111 Encounter for antineoplastic chemotherapy: Secondary | ICD-10-CM | POA: Diagnosis not present

## 2024-08-21 DIAGNOSIS — M25551 Pain in right hip: Secondary | ICD-10-CM | POA: Diagnosis not present

## 2024-08-21 DIAGNOSIS — I739 Peripheral vascular disease, unspecified: Secondary | ICD-10-CM | POA: Insufficient documentation

## 2024-08-21 LAB — CMP (CANCER CENTER ONLY)
ALT: 17 U/L (ref 0–44)
AST: 25 U/L (ref 15–41)
Albumin: 4 g/dL (ref 3.5–5.0)
Alkaline Phosphatase: 98 U/L (ref 38–126)
Anion gap: 12 (ref 5–15)
BUN: 5 mg/dL — ABNORMAL LOW (ref 6–20)
CO2: 23 mmol/L (ref 22–32)
Calcium: 8.8 mg/dL — ABNORMAL LOW (ref 8.9–10.3)
Chloride: 104 mmol/L (ref 98–111)
Creatinine: 0.47 mg/dL (ref 0.44–1.00)
GFR, Estimated: >60 mL/min (ref >60)
Glucose, Bld: 210 mg/dL — ABNORMAL HIGH (ref 70–99)
Potassium: 4 mmol/L (ref 3.5–5.1)
Sodium: 139 mmol/L (ref 135–145)
Total Bilirubin: 0.5 mg/dL (ref 0.0–1.2)
Total Protein: 7.6 g/dL (ref 6.5–8.1)

## 2024-08-21 LAB — CBC WITH DIFFERENTIAL (CANCER CENTER ONLY)
Abs Immature Granulocytes: 0.04 K/uL (ref 0.00–0.07)
Basophils Absolute: 0.1 K/uL (ref 0.0–0.1)
Basophils Relative: 1 %
Eosinophils Absolute: 0.1 K/uL (ref 0.0–0.5)
Eosinophils Relative: 2 %
HCT: 37.8 % (ref 36.0–46.0)
Hemoglobin: 12.7 g/dL (ref 12.0–15.0)
Immature Granulocytes: 1 %
Lymphocytes Relative: 47 %
Lymphs Abs: 2.9 K/uL (ref 0.7–4.0)
MCH: 32.2 pg (ref 26.0–34.0)
MCHC: 33.6 g/dL (ref 30.0–36.0)
MCV: 95.7 fL (ref 80.0–100.0)
Monocytes Absolute: 0.6 K/uL (ref 0.1–1.0)
Monocytes Relative: 10 %
Neutro Abs: 2.3 K/uL (ref 1.7–7.7)
Neutrophils Relative %: 39 %
Platelet Count: 459 K/uL — ABNORMAL HIGH (ref 150–400)
RBC: 3.95 MIL/uL (ref 3.87–5.11)
RDW: 13.1 % (ref 11.5–15.5)
WBC Count: 5.9 K/uL (ref 4.0–10.5)
nRBC: 0 % (ref 0.0–0.2)

## 2024-08-21 MED ORDER — HYDROCODONE-ACETAMINOPHEN 5-325 MG PO TABS: 1.0000 | ORAL_TABLET | Freq: Two times a day (BID) | ORAL | 0 refills | Status: DC | PRN

## 2024-08-21 MED FILL — Fosaprepitant Dimeglumine For IV Infusion 150 MG (Base Eq): INTRAVENOUS | Qty: 5 | Status: AC
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
## 2024-08-21 NOTE — Patient Instructions (Signed)
 Please continue all other medications as before, and refills have been done for the hydrocodone   Please have the pharmacy call with any other refills you may need.  Please continue your efforts at being more active, low cholesterol diet, and weight control.  Please keep your appointments with your specialists as you may have planned  You will be contacted regarding the referral for: Nutrition, and Endocrinology, and Orthopedic  Please make an Appointment to return in 6 months, or sooner if needed

## 2024-08-21 NOTE — Assessment & Plan Note (Signed)
 With previous plain films with right hip arthritis - for hydrocodone  5 325 prn limited rx, not meant for long term pain control, and refer orthopedic

## 2024-08-21 NOTE — Assessment & Plan Note (Signed)
 Pt to continue chantix 
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
## 2024-08-21 NOTE — Assessment & Plan Note (Signed)
 Improved now stable, pt for DM education referral, and Endo referral per request,  pt declines further lab today

## 2024-08-21 NOTE — Assessment & Plan Note (Signed)
 Pt to start chemotherapy tomorrow

## 2024-08-22 ENCOUNTER — Encounter: Payer: Self-pay | Admitting: Internal Medicine

## 2024-08-22 ENCOUNTER — Other Ambulatory Visit (HOSPITAL_COMMUNITY): Payer: Self-pay

## 2024-08-22 ENCOUNTER — Inpatient Hospital Stay

## 2024-08-22 VITALS — BP 159/95 | HR 78 | Temp 98.0°F | Resp 18

## 2024-08-22 DIAGNOSIS — R059 Cough, unspecified: Secondary | ICD-10-CM | POA: Diagnosis not present

## 2024-08-22 DIAGNOSIS — C3411 Malignant neoplasm of upper lobe, right bronchus or lung: Secondary | ICD-10-CM | POA: Diagnosis not present

## 2024-08-22 DIAGNOSIS — Z902 Acquired absence of lung [part of]: Secondary | ICD-10-CM | POA: Diagnosis not present

## 2024-08-22 DIAGNOSIS — Z5189 Encounter for other specified aftercare: Secondary | ICD-10-CM | POA: Diagnosis not present

## 2024-08-22 DIAGNOSIS — F17200 Nicotine dependence, unspecified, uncomplicated: Secondary | ICD-10-CM | POA: Diagnosis not present

## 2024-08-22 DIAGNOSIS — R0789 Other chest pain: Secondary | ICD-10-CM | POA: Diagnosis not present

## 2024-08-22 DIAGNOSIS — Z5111 Encounter for antineoplastic chemotherapy: Secondary | ICD-10-CM | POA: Diagnosis not present

## 2024-08-22 MED ORDER — FAMOTIDINE IN NACL 20-0.9 MG/50ML-% IV SOLN
20.0000 mg | Freq: Once | INTRAVENOUS | Status: AC
  Administered 2024-08-22: 20 mg via INTRAVENOUS
  Filled 2024-08-22: qty 50

## 2024-08-22 MED ORDER — DIPHENHYDRAMINE HCL 50 MG/ML IJ SOLN
50.0000 mg | Freq: Once | INTRAMUSCULAR | Status: AC
  Administered 2024-08-22: 50 mg via INTRAVENOUS
  Filled 2024-08-22: qty 1

## 2024-08-22 MED ORDER — DEXAMETHASONE SOD PHOSPHATE PF 10 MG/ML IJ SOLN
10.0000 mg | Freq: Once | INTRAMUSCULAR | Status: AC
  Administered 2024-08-22: 10 mg via INTRAVENOUS

## 2024-08-22 MED ORDER — SODIUM CHLORIDE 0.9 % IV SOLN
175.0000 mg/m2 | Freq: Once | INTRAVENOUS | Status: AC
  Administered 2024-08-22: 324 mg via INTRAVENOUS
  Filled 2024-08-22: qty 54

## 2024-08-22 MED ORDER — SODIUM CHLORIDE 0.9 % IV SOLN: INTRAVENOUS | Status: DC

## 2024-08-22 MED ORDER — PALONOSETRON HCL INJECTION 0.25 MG/5ML
0.2500 mg | Freq: Once | INTRAVENOUS | Status: AC
  Administered 2024-08-22: 0.25 mg via INTRAVENOUS
  Filled 2024-08-22: qty 5

## 2024-08-22 MED ORDER — SODIUM CHLORIDE 0.9 % IV SOLN
605.0000 mg | Freq: Once | INTRAVENOUS | Status: AC
  Administered 2024-08-22: 610 mg via INTRAVENOUS
  Filled 2024-08-22: qty 61

## 2024-08-22 MED ORDER — SODIUM CHLORIDE 0.9 % IV SOLN
150.0000 mg | Freq: Once | INTRAVENOUS | Status: AC
  Administered 2024-08-22: 150 mg via INTRAVENOUS
  Filled 2024-08-22: qty 150

## 2024-08-22 NOTE — Progress Notes (Signed)
 Patient called after receiving my card per my request.  Introduced myself as Dance Movement Psychotherapist and to ask if she had any financial questions or concerns regarding treatment. Patient asked about assistance with gas. Discussed one-time $1000 [employer] grant to assist with personal expenses while going through treatment. Patient may be eligible due to government assistance received. Advised what would be needed to apply. She states she would speak with her daughter and give me a call back if interested in applying.  She has my card to do so and for any additional financial questions or concerns.

## 2024-08-22 NOTE — Patient Instructions (Signed)

## 2024-08-23 ENCOUNTER — Telehealth: Payer: Self-pay

## 2024-08-23 NOTE — Telephone Encounter (Signed)
-----   Message from Nurse Emmalee P sent at 08/22/2024  4:35 PM EST ----- Regarding: Dr. Sherrod- First time taxol and carboplatin Ms. Eland completed first time taxol and carboplatin tx with no issues or complaints. VSS at discharge, AVS reviewed, and ambulatory to lobby.

## 2024-08-24 ENCOUNTER — Inpatient Hospital Stay

## 2024-08-24 VITALS — BP 134/62 | HR 74 | Temp 98.3°F | Resp 18

## 2024-08-24 DIAGNOSIS — R059 Cough, unspecified: Secondary | ICD-10-CM | POA: Diagnosis not present

## 2024-08-24 DIAGNOSIS — F17200 Nicotine dependence, unspecified, uncomplicated: Secondary | ICD-10-CM | POA: Diagnosis not present

## 2024-08-24 DIAGNOSIS — R0789 Other chest pain: Secondary | ICD-10-CM | POA: Diagnosis not present

## 2024-08-24 DIAGNOSIS — Z902 Acquired absence of lung [part of]: Secondary | ICD-10-CM | POA: Diagnosis not present

## 2024-08-24 DIAGNOSIS — Z5189 Encounter for other specified aftercare: Secondary | ICD-10-CM | POA: Diagnosis not present

## 2024-08-24 DIAGNOSIS — C3411 Malignant neoplasm of upper lobe, right bronchus or lung: Secondary | ICD-10-CM | POA: Diagnosis not present

## 2024-08-24 DIAGNOSIS — Z5111 Encounter for antineoplastic chemotherapy: Secondary | ICD-10-CM | POA: Diagnosis not present

## 2024-08-24 MED ORDER — PEGFILGRASTIM-CBQV 6 MG/0.6ML ~~LOC~~ SOSY
6.0000 mg | PREFILLED_SYRINGE | Freq: Once | SUBCUTANEOUS | Status: AC
  Administered 2024-08-24: 6 mg via SUBCUTANEOUS

## 2024-08-25 DIAGNOSIS — C3490 Malignant neoplasm of unspecified part of unspecified bronchus or lung: Secondary | ICD-10-CM | POA: Diagnosis not present

## 2024-08-27 ENCOUNTER — Encounter: Payer: Self-pay | Admitting: Surgery

## 2024-08-27 DIAGNOSIS — E119 Type 2 diabetes mellitus without complications: Secondary | ICD-10-CM | POA: Diagnosis not present

## 2024-08-29 ENCOUNTER — Other Ambulatory Visit: Payer: Self-pay
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
## 2024-08-29 NOTE — Patient Instructions (Addendum)
 Visit Information  Peggy Warren was given information about Medicaid Managed Care team care coordination services as a part of their [employer] Medicaid benefit.   If you would like to schedule transportation through your [employer] [facility] plan, please call the following number at least 2 days in advance of your appointment: [phone]  If you are experiencing a behavioral health crisis, call the [employer] [place]  Behavioral Health Crisis Line at [phone]). The line is available 24 hours a day, seven days a week.    Please see education materials related to diabetes provided by MyChart link.  Care plan and visit instructions communicated with the patient verbally today. Patient agrees to receive a copy in MyChart. Active MyChart status and patient understanding of how to access instructions and care plan via MyChart confirmed with patient.     Telephone follow up appointment with Managed Medicaid care management team member scheduled for:09/28/2024  Peggy Shutter, RN, MSN, BSN, CCM [name]  [facility], Population Health Case Manager Phone: [phone]    Diabetes: Tips for Eating Away From Home Managing your blood sugar, also called glucose, can be hard when you don't prepare your own meals. The following tips can help you manage your diabetes when you eat away from home. If you have questions or if you need help, work with your health care provider or dietitian. How to plan ahead Plan ahead if you know you'll be eating away from home. To do this: Try to eat your meals and snacks at about the same time each day. If you know your meal is going to be later than normal, make sure you have a small snack. Being very hungry can cause you to make unhealthy food choices. Make a list of restaurants near you that offer healthy choices. If a restaurant has a mudlogger, take the menu home and plan what you'll  order ahead of time. Look up the restaurant you want to eat at online. Many chain and fast-food restaurants list nutritional information online. Use this information to choose the healthiest options and to calculate how many carbohydrates will be in your meal. Use a carbohydrate-counting book or mobile app to look up the carbohydrate content and serving sizes of the foods you want to eat. How to get more free foods in your diet A free food is any food or drink that has less than 5 grams of carbohydrates and fewer than 20 calories per serving. These foods are high in fiber and nutrients. And they're low in calories, carbohydrates, and fats. Free foods include: Non-starchy vegetables, such as carrots, broccoli, celery, lettuce, or green beans. Non-sugar drinks, such as water, unsweetened coffee, or unsweetened tea. Low-calorie salad dressings. Sugar-free gelatin. Starting meals with a salad full of vegetables is a healthy choice that includes a lot of free foods. Avoid high-calorie salad toppings, such as bacon, cheese, and high-fat dressings. Ask for your salad dressing to be served on the side. That way, you'll dip your fork in the dressing and then in the salad. This allows you to control how much dressing you eat and still get the flavor with every bite. How to control the amount of carbohydrates you have Ask your server to take away the bread basket or chips from your table. Choose light yogurt or Greek yogurt instead of nonfat sweetened yogurt. Order fresh fruit. A salad bar often offers fresh fruit choices. Avoid canned fruit because it's usually packed in sugar or syrup. Order a  salad and ask for dressing on the side. Ask for substitutes. For example, if your meal comes with french fries, ask for a side salad or steamed veggies instead. If a meal comes with fried chicken, ask for grilled chicken instead. How to know which drinks to choose or avoid Choose drinks that are low in calories and  sugar. These include: Water. Unsweetened tea or coffee. Low-fat milk. Avoid these drinks: Alcoholic beverages. Regular (not diet) sodas. General tips If you take insulin , wait to take your insulin  until your food gets to your table. This will ensure that your insulin  and your food are timed correctly. Get to know serving sizes and learn to recognize how many servings are in a portion. Restaurant portions are often two to three times larger than what you really need. Ask your server for a to-go box at the start of the meal. When your food comes, leave the amount you should have on your plate, and put the rest in the to-go box. This will help keep you from being tempted to eat too much. Consider splitting an entre with someone and ordering a side salad. Avoid buffets. They are often too tempting and lead to overeating. Where to find more information To learn more, go to: American Diabetes Association at diabetes.org. Click Search and type healthy eating. Find the link you need. Academy of Nutrition and Dietetics at deathprevention.it. This information is not intended to replace advice given to you by your health care provider. Make sure you discuss any questions you have with your health care provider. Document Revised: 09/15/2023 Document Reviewed: 09/15/2023 Elsevier Patient Education  2025 [employer].  Diabetes: Healthy Eating for Adults When you have diabetes, also called diabetes mellitus, it's important to have healthy eating habits. Your blood sugar (glucose) levels are greatly affected by what you eat and drink. You need to eat healthy foods in the right amounts, at about the same times each day. Doing this can help you: Manage your blood sugar. Lower your risk of heart disease. Improve your blood pressure. Reach or stay at a healthy weight. What can affect my meal plan? Every person with diabetes is different. And each person has different needs for a meal plan. Your health  care provider may suggest that you work with an expert in healthy eating called a dietitian. They can help you make a meal plan that's best for you. How do carbohydrates affect me? Carbohydrates, also called carbs, affect your blood sugar level more than any other type of food. Eating carbs raises the amount of sugar in your blood. It's important to know how many carbs you can safely have in each meal. This is different for every person. Your dietitian can help you calculate how many carbs you should have at each meal and for each snack. How does alcohol affect me? Alcohol can cause a decrease in blood sugar (hypoglycemia), especially if you use insulin  or take certain diabetes medicines by mouth. Hypoglycemia can be a life-threatening condition. Symptoms of hypoglycemia are similar to those of having too much alcohol. They include confusion, being sleepy, and feeling dizzy. Do not drink alcohol if: Your provider tells you not to drink. You're pregnant, may be pregnant, or plan to become pregnant. What are tips for following this plan? Reading food labels Start by checking the serving size on the Nutrition Facts label of packaged foods and drinks. The number of calories and the amount of carbs, fats, and other nutrients listed on the label are  based on one serving of the item. Many items contain more than one serving per package. Check the total grams (g) of carbs in one serving. Check the number of grams of saturated fats and trans fats in one serving. Choose foods that have a low amount or none of these fats. Check the number of milligrams (mg) of salt (sodium) in one serving. Most people should limit their total sodium intake to less than 2,300 mg per day. Always check the nutrition information of foods labeled as low-fat or nonfat. These foods may be higher in added sugar or refined carbs and should be avoided. Talk to your dietitian to identify your daily goals for nutrients listed on the  label. Shopping Avoid buying canned, pre-made, or processed foods. These foods tend to be high in fat, sodium, and added sugar. Shop around the outside edge of the grocery store. This is where you'll most often find fresh fruits and vegetables, bulk grains, fresh meats, and fresh dairy products. Cooking Use low-heat cooking methods, such as baking, instead of high-heat methods like deep frying. Cook using healthy oils, such as olive, canola, or sunflower oil. Avoid cooking with butter, cream, or high-fat meats. Meal planning  Eat meals and snacks regularly. Try to eat them at the same times every day. Avoid going too long without eating. Eat foods that are high in fiber, such as fresh fruits, vegetables, beans, and whole grains. Eat 4-6 oz (112-168 g) of lean protein each day, such as lean meat, chicken, fish, eggs, or tofu. One ounce (oz) (28 g) of lean protein is equal to: 1 oz (28 g) of meat, chicken, or fish. 1 egg.  cup (62 g) of tofu. Eat some foods each day that contain healthy fats, such as avocado, nuts, seeds, and fish. What foods should I eat? Fruits Berries. Apples. Oranges. Peaches. Apricots. Plums. Grapes. Mangoes. Papayas. Pomegranates. Kiwi. Cherries. Vegetables Leafy greens, including lettuce, spinach, kale, chard, collard greens, mustard greens, and cabbage. Beets. Cauliflower. Broccoli. Carrots. Green beans. Tomatoes. Peppers. Onions. Cucumbers. Brussels sprouts. Grains Whole grains, such as whole-wheat or whole-grain bread, crackers, tortillas, cereal, and pasta. Unsweetened oatmeal. Quinoa. Brown or wild rice. Meats and other proteins Seafood. Poultry without skin. Lean cuts of poultry and beef. Tofu. Nuts. Seeds. Dairy Low-fat or fat-free dairy products such as milk, yogurt, and cheese. The items listed above may not be all the foods and drinks you can have. Talk with a dietitian to learn more. What foods should I avoid? Fruits Fruits canned with  syrup. Vegetables Canned vegetables. Frozen vegetables with butter or cream sauce. Grains Refined white flour and flour products such as bread, pasta, snack foods, and cereals. Avoid all processed foods. Meats and other proteins Fatty cuts of meat. Poultry with skin. Breaded or fried meats. Processed meat. Avoid saturated fats. Dairy Full-fat yogurt, cheese, or milk. Beverages Sweetened drinks, such as soda or iced tea. The items listed above may not be all the foods and drinks you should avoid. Talk with a dietitian to learn more. Where to find more information: To learn more, go to: Academy of Nutrition and Dietetics at deathprevention.it. Click Search and type diabetes. Find the link you need. Centers for Disease Control and Prevention at tonerpromos.no. Click Search and type diabetes. Find the link you need. American Diabetes Association: diabetes.org/food-nutrition [employer] of Diabetes and Digestive and Kidney Diseases: stagesync.si This information is not intended to replace advice given to you by your health care provider. Make sure you discuss any  questions you have with your health care provider. Document Revised: 09/15/2023 Document Reviewed: 09/15/2023 Elsevier Patient Education  2025 [employer].

## 2024-09-03 DIAGNOSIS — E119 Type 2 diabetes mellitus without complications: Secondary | ICD-10-CM | POA: Diagnosis not present

## 2024-09-05 ENCOUNTER — Other Ambulatory Visit (INDEPENDENT_AMBULATORY_CARE_PROVIDER_SITE_OTHER): Payer: Self-pay

## 2024-09-05 ENCOUNTER — Ambulatory Visit: Admitting: Orthopaedic Surgery

## 2024-09-05 VITALS — Ht 62.5 in | Wt 181.6 lb

## 2024-09-05 DIAGNOSIS — M1611 Unilateral primary osteoarthritis, right hip: Secondary | ICD-10-CM | POA: Diagnosis not present

## 2024-09-05 DIAGNOSIS — M545 Low back pain, unspecified: Secondary | ICD-10-CM | POA: Diagnosis not present
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
## 2024-09-07 ENCOUNTER — Encounter (HOSPITAL_COMMUNITY): Payer: Self-pay

## 2024-09-07 ENCOUNTER — Other Ambulatory Visit: Payer: Self-pay

## 2024-09-07 ENCOUNTER — Emergency Department (HOSPITAL_COMMUNITY)

## 2024-09-07 ENCOUNTER — Inpatient Hospital Stay (HOSPITAL_COMMUNITY)
Admission: EM | Admit: 2024-09-07 | Discharge: 2024-09-10 | DRG: 176 | Disposition: A | Discharge status: Home / Self Care | Attending: Internal Medicine | Admitting: Internal Medicine

## 2024-09-07 DIAGNOSIS — F17210 Nicotine dependence, cigarettes, uncomplicated: Secondary | ICD-10-CM | POA: Diagnosis present

## 2024-09-07 DIAGNOSIS — C3411 Malignant neoplasm of upper lobe, right bronchus or lung: Secondary | ICD-10-CM | POA: Diagnosis not present

## 2024-09-07 DIAGNOSIS — Z8673 Personal history of transient ischemic attack (TIA), and cerebral infarction without residual deficits: Secondary | ICD-10-CM

## 2024-09-07 DIAGNOSIS — G44011 Episodic cluster headache, intractable: Principal | ICD-10-CM

## 2024-09-07 DIAGNOSIS — Z794 Long term (current) use of insulin: Secondary | ICD-10-CM | POA: Diagnosis not present

## 2024-09-07 DIAGNOSIS — Z823 Family history of stroke: Secondary | ICD-10-CM

## 2024-09-07 DIAGNOSIS — Z1152 Encounter for screening for COVID-19: Secondary | ICD-10-CM

## 2024-09-07 DIAGNOSIS — J438 Other emphysema: Secondary | ICD-10-CM | POA: Diagnosis not present

## 2024-09-07 DIAGNOSIS — M316 Other giant cell arteritis: Secondary | ICD-10-CM | POA: Diagnosis present

## 2024-09-07 DIAGNOSIS — R059 Cough, unspecified: Secondary | ICD-10-CM | POA: Diagnosis not present

## 2024-09-07 DIAGNOSIS — R0602 Shortness of breath: Principal | ICD-10-CM

## 2024-09-07 DIAGNOSIS — Z902 Acquired absence of lung [part of]: Secondary | ICD-10-CM

## 2024-09-07 DIAGNOSIS — Z7982 Long term (current) use of aspirin: Secondary | ICD-10-CM

## 2024-09-07 DIAGNOSIS — E872 Acidosis, unspecified: Secondary | ICD-10-CM | POA: Diagnosis present

## 2024-09-07 DIAGNOSIS — I2693 Single subsegmental pulmonary embolism without acute cor pulmonale: Secondary | ICD-10-CM | POA: Diagnosis not present

## 2024-09-07 DIAGNOSIS — R062 Wheezing: Secondary | ICD-10-CM | POA: Diagnosis not present

## 2024-09-07 DIAGNOSIS — H538 Other visual disturbances: Secondary | ICD-10-CM | POA: Diagnosis present

## 2024-09-07 DIAGNOSIS — I10 Essential (primary) hypertension: Secondary | ICD-10-CM | POA: Diagnosis not present

## 2024-09-07 DIAGNOSIS — Z8249 Family history of ischemic heart disease and other diseases of the circulatory system: Secondary | ICD-10-CM

## 2024-09-07 DIAGNOSIS — Z7984 Long term (current) use of oral hypoglycemic drugs: Secondary | ICD-10-CM

## 2024-09-07 DIAGNOSIS — R918 Other nonspecific abnormal finding of lung field: Secondary | ICD-10-CM | POA: Diagnosis not present

## 2024-09-07 DIAGNOSIS — R519 Headache, unspecified: Secondary | ICD-10-CM | POA: Diagnosis present

## 2024-09-07 DIAGNOSIS — K219 Gastro-esophageal reflux disease without esophagitis: Secondary | ICD-10-CM | POA: Diagnosis present

## 2024-09-07 DIAGNOSIS — E1165 Type 2 diabetes mellitus with hyperglycemia: Secondary | ICD-10-CM | POA: Diagnosis not present

## 2024-09-07 DIAGNOSIS — E119 Type 2 diabetes mellitus without complications: Secondary | ICD-10-CM

## 2024-09-07 DIAGNOSIS — I2699 Other pulmonary embolism without acute cor pulmonale: Principal | ICD-10-CM | POA: Diagnosis present

## 2024-09-07 DIAGNOSIS — Z79899 Other long term (current) drug therapy: Secondary | ICD-10-CM

## 2024-09-07 DIAGNOSIS — J449 Chronic obstructive pulmonary disease, unspecified: Secondary | ICD-10-CM | POA: Diagnosis present

## 2024-09-07 DIAGNOSIS — Z85118 Personal history of other malignant neoplasm of bronchus and lung: Secondary | ICD-10-CM | POA: Diagnosis not present

## 2024-09-07 LAB — I-STAT VENOUS BLOOD GAS, ED
Acid-Base Excess: 2 mmol/L (ref 0.0–2.0)
Bicarbonate: 27.1 mmol/L (ref 20.0–28.0)
Calcium, Ion: 1.09 mmol/L — ABNORMAL LOW (ref 1.15–1.40)
HCT: 37 % (ref 36.0–46.0)
Hemoglobin: 12.6 g/dL (ref 12.0–15.0)
O2 Saturation: 91 %
Potassium: 3.6 mmol/L (ref 3.5–5.1)
Sodium: 138 mmol/L (ref 135–145)
TCO2: 28 mmol/L (ref 22–32)
pCO2, Ven: 42.8 mmHg — ABNORMAL LOW (ref 44–60)
pH, Ven: 7.41 (ref 7.25–7.43)
pO2, Ven: 62 mmHg — ABNORMAL HIGH (ref 32–45)

## 2024-09-07 LAB — COMPREHENSIVE METABOLIC PANEL WITH GFR
ALT: 18 U/L (ref 0–44)
AST: 30 U/L (ref 15–41)
Albumin: 3.3 g/dL — ABNORMAL LOW (ref 3.5–5.0)
Alkaline Phosphatase: 99 U/L (ref 38–126)
Anion gap: 11 (ref 5–15)
BUN: <5 mg/dL — ABNORMAL LOW (ref 6–20)
CO2: 26 mmol/L (ref 22–32)
Calcium: 8.5 mg/dL — ABNORMAL LOW (ref 8.9–10.3)
Chloride: 103 mmol/L (ref 98–111)
Creatinine, Ser: 0.55 mg/dL (ref 0.44–1.00)
GFR, Estimated: >60 mL/min (ref >60)
Glucose, Bld: 110 mg/dL — ABNORMAL HIGH (ref 70–99)
Potassium: 3.7 mmol/L (ref 3.5–5.1)
Sodium: 140 mmol/L (ref 135–145)
Total Bilirubin: 0.5 mg/dL (ref 0.0–1.2)
Total Protein: 6.9 g/dL (ref 6.5–8.1)

## 2024-09-07 LAB — URINALYSIS, W/ REFLEX TO CULTURE (INFECTION SUSPECTED)
Bilirubin Urine: NEGATIVE
Glucose, UA: NEGATIVE mg/dL
Hgb urine dipstick: NEGATIVE
Ketones, ur: NEGATIVE mg/dL
Leukocytes,Ua: NEGATIVE
Nitrite: NEGATIVE
Protein, ur: NEGATIVE mg/dL
Specific Gravity, Urine: 1.024 (ref 1.005–1.030)
pH: 6 (ref 5.0–8.0)

## 2024-09-07 LAB — BRAIN NATRIURETIC PEPTIDE: B Natriuretic Peptide: 39.8 pg/mL (ref 0.0–100.0)

## 2024-09-07 LAB — CBC WITH DIFFERENTIAL/PLATELET
Abs Immature Granulocytes: 0.02 K/uL (ref 0.00–0.07)
Basophils Absolute: 0.1 K/uL (ref 0.0–0.1)
Basophils Relative: 1 %
Eosinophils Absolute: 0 K/uL (ref 0.0–0.5)
Eosinophils Relative: 0 %
HCT: 35.6 % — ABNORMAL LOW (ref 36.0–46.0)
Hemoglobin: 11.8 g/dL — ABNORMAL LOW (ref 12.0–15.0)
Immature Granulocytes: 0 %
Lymphocytes Relative: 32 %
Lymphs Abs: 2.8 K/uL (ref 0.7–4.0)
MCH: 32.9 pg (ref 26.0–34.0)
MCHC: 33.1 g/dL (ref 30.0–36.0)
MCV: 99.2 fL (ref 80.0–100.0)
Monocytes Absolute: 0.7 K/uL (ref 0.1–1.0)
Monocytes Relative: 8 %
Neutro Abs: 5 K/uL (ref 1.7–7.7)
Neutrophils Relative %: 59 %
Platelets: 326 K/uL (ref 150–400)
RBC: 3.59 MIL/uL — ABNORMAL LOW (ref 3.87–5.11)
RDW: 15.5 % (ref 11.5–15.5)
WBC: 8.5 K/uL (ref 4.0–10.5)
nRBC: 0 % (ref 0.0–0.2)

## 2024-09-07 LAB — RESP PANEL BY RT-PCR (RSV, FLU A&B, COVID)  RVPGX2
Influenza A by PCR: NEGATIVE
Influenza B by PCR: NEGATIVE
Resp Syncytial Virus by PCR: NEGATIVE
SARS Coronavirus 2 by RT PCR: NEGATIVE

## 2024-09-07 LAB — I-STAT CHEM 8, ED
BUN: <3 mg/dL — ABNORMAL LOW (ref 6–20)
Calcium, Ion: 1.11 mmol/L — ABNORMAL LOW (ref 1.15–1.40)
Chloride: 100 mmol/L (ref 98–111)
Creatinine, Ser: 0.5 mg/dL (ref 0.44–1.00)
Glucose, Bld: 110 mg/dL — ABNORMAL HIGH (ref 70–99)
HCT: 38 % (ref 36.0–46.0)
Hemoglobin: 12.9 g/dL (ref 12.0–15.0)
Potassium: 3.6 mmol/L (ref 3.5–5.1)
Sodium: 140 mmol/L (ref 135–145)
TCO2: 25 mmol/L (ref 22–32)

## 2024-09-07 LAB — LIPASE, BLOOD: Lipase: 21 U/L (ref 11–51)

## 2024-09-07 LAB — TROPONIN I (HIGH SENSITIVITY)
Troponin I (High Sensitivity): 5 ng/L (ref <18)
Troponin I (High Sensitivity): 7 ng/L (ref <18)

## 2024-09-07 LAB — I-STAT CG4 LACTIC ACID, ED
Lactic Acid, Venous: 2.4 mmol/L (ref 0.5–1.9)
Lactic Acid, Venous: 3.8 mmol/L (ref 0.5–1.9)

## 2024-09-07 LAB — LACTIC ACID, PLASMA: Lactic Acid, Venous: 6.5 mmol/L (ref 0.5–1.9)

## 2024-09-07 MED ORDER — AMLODIPINE BESYLATE 5 MG PO TABS
10.0000 mg | ORAL_TABLET | Freq: Every day | ORAL | Status: DC
  Administered 2024-09-08 – 2024-09-10 (×3): 10 mg via ORAL
  Filled 2024-09-07 (×3): qty 2

## 2024-09-07 MED ORDER — LACTATED RINGERS IV BOLUS
500.0000 mL | Freq: Once | INTRAVENOUS | Status: AC
  Administered 2024-09-07: 500 mL via INTRAVENOUS

## 2024-09-07 MED ORDER — UMECLIDINIUM-VILANTEROL 62.5-25 MCG/ACT IN AEPB
1.0000 | INHALATION_SPRAY | Freq: Every day | RESPIRATORY_TRACT | Status: DC
  Filled 2024-09-07: qty 14

## 2024-09-07 MED ORDER — ARFORMOTEROL TARTRATE 15 MCG/2ML IN NEBU
15.0000 ug | INHALATION_SOLUTION | Freq: Two times a day (BID) | RESPIRATORY_TRACT | Status: DC
  Administered 2024-09-08 – 2024-09-10 (×5): 15 ug via RESPIRATORY_TRACT
  Filled 2024-09-07 (×5): qty 2

## 2024-09-07 MED ORDER — METHOCARBAMOL 1000 MG/10ML IJ SOLN
500.0000 mg | Freq: Four times a day (QID) | INTRAMUSCULAR | Status: DC | PRN
  Administered 2024-09-07: 500 mg via INTRAVENOUS
  Filled 2024-09-07: qty 10

## 2024-09-07 MED ORDER — INSULIN ASPART 100 UNIT/ML IJ SOLN: 0.0000 [IU] | Freq: Every day | INTRAMUSCULAR | Status: DC

## 2024-09-07 MED ORDER — ONDANSETRON HCL 4 MG/2ML IJ SOLN: 4.0000 mg | Freq: Four times a day (QID) | INTRAMUSCULAR | Status: DC | PRN

## 2024-09-07 MED ORDER — INSULIN ASPART 100 UNIT/ML IJ SOLN
0.0000 [IU] | Freq: Three times a day (TID) | INTRAMUSCULAR | Status: DC
  Administered 2024-09-09 – 2024-09-10 (×2): 1 [IU] via SUBCUTANEOUS
  Filled 2024-09-07 (×2): qty 1

## 2024-09-07 MED ORDER — ENOXAPARIN SODIUM 80 MG/0.8ML IJ SOSY
80.0000 mg | PREFILLED_SYRINGE | Freq: Two times a day (BID) | INTRAMUSCULAR | Status: DC
  Administered 2024-09-07 – 2024-09-08 (×3): 80 mg via SUBCUTANEOUS
  Filled 2024-09-07 (×6): qty 0.8

## 2024-09-07 MED ORDER — IOHEXOL 350 MG/ML SOLN
75.0000 mL | Freq: Once | INTRAVENOUS | Status: AC | PRN
  Administered 2024-09-07: 75 mL via INTRAVENOUS

## 2024-09-07 MED ORDER — IPRATROPIUM-ALBUTEROL 0.5-2.5 (3) MG/3ML IN SOLN
3.0000 mL | Freq: Once | RESPIRATORY_TRACT | Status: AC
  Administered 2024-09-07: 3 mL via RESPIRATORY_TRACT
  Filled 2024-09-07: qty 3

## 2024-09-07 MED ORDER — FENTANYL CITRATE (PF) 50 MCG/ML IJ SOSY
50.0000 ug | PREFILLED_SYRINGE | Freq: Once | INTRAMUSCULAR | Status: AC
  Administered 2024-09-07: 50 ug via INTRAVENOUS
  Filled 2024-09-07: qty 1

## 2024-09-07 MED ORDER — SENNOSIDES-DOCUSATE SODIUM 8.6-50 MG PO TABS
1.0000 | ORAL_TABLET | Freq: Every evening | ORAL | Status: DC | PRN
  Administered 2024-09-08 – 2024-09-10 (×2): 1 via ORAL
  Filled 2024-09-07 (×2): qty 1

## 2024-09-07 MED ORDER — REVEFENACIN 175 MCG/3ML IN SOLN
175.0000 ug | Freq: Every day | RESPIRATORY_TRACT | Status: DC
  Administered 2024-09-08 – 2024-09-10 (×3): 175 ug via RESPIRATORY_TRACT
  Filled 2024-09-07 (×3): qty 3

## 2024-09-07 MED ORDER — LISINOPRIL 10 MG PO TABS
5.0000 mg | ORAL_TABLET | Freq: Every day | ORAL | Status: DC
  Administered 2024-09-08 – 2024-09-10 (×3): 5 mg via ORAL
  Filled 2024-09-07 (×3): qty 1

## 2024-09-07 MED ORDER — ACETAMINOPHEN 650 MG RE SUPP: 650.0000 mg | Freq: Four times a day (QID) | RECTAL | Status: DC | PRN

## 2024-09-07 MED ORDER — ONDANSETRON HCL 4 MG PO TABS
4.0000 mg | ORAL_TABLET | Freq: Four times a day (QID) | ORAL | Status: DC | PRN
  Administered 2024-09-09: 4 mg via ORAL
  Filled 2024-09-07: qty 1

## 2024-09-07 MED ORDER — ATORVASTATIN CALCIUM 40 MG PO TABS
40.0000 mg | ORAL_TABLET | Freq: Every day | ORAL | Status: DC
  Administered 2024-09-08 – 2024-09-10 (×3): 40 mg via ORAL
  Filled 2024-09-07 (×3): qty 1

## 2024-09-07 MED ORDER — ACETAMINOPHEN 325 MG PO TABS: 650.0000 mg | ORAL_TABLET | Freq: Four times a day (QID) | ORAL | Status: DC | PRN

## 2024-09-07 MED ORDER — IPRATROPIUM-ALBUTEROL 0.5-2.5 (3) MG/3ML IN SOLN
3.0000 mL | RESPIRATORY_TRACT | Status: DC | PRN
  Administered 2024-09-07: 3 mL via RESPIRATORY_TRACT
  Filled 2024-09-07: qty 3

## 2024-09-07 MED ORDER — SODIUM CHLORIDE 0.9% FLUSH
3.0000 mL | Freq: Two times a day (BID) | INTRAVENOUS | Status: DC
  Administered 2024-09-07 – 2024-09-10 (×6): 3 mL via INTRAVENOUS

## 2024-09-07 MED ORDER — HYDROMORPHONE HCL 1 MG/ML IJ SOLN
0.5000 mg | INTRAMUSCULAR | Status: DC | PRN
  Administered 2024-09-07 – 2024-09-08 (×3): 0.5 mg via INTRAVENOUS
  Filled 2024-09-07: qty 0.5
  Filled 2024-09-07: qty 1
  Filled 2024-09-07: qty 0.5

## 2024-09-07 MED ORDER — INSULIN GLARGINE-YFGN 100 UNIT/ML ~~LOC~~ SOLN
20.0000 [IU] | Freq: Two times a day (BID) | SUBCUTANEOUS | Status: DC
  Administered 2024-09-07 – 2024-09-10 (×6): 20 [IU] via SUBCUTANEOUS
  Filled 2024-09-07 (×8): qty 0.2

## 2024-09-07 MED ORDER — OXYCODONE HCL 5 MG PO TABS
5.0000 mg | ORAL_TABLET | ORAL | Status: DC | PRN
  Administered 2024-09-07 – 2024-09-10 (×8): 5 mg via ORAL
  Filled 2024-09-07 (×8): qty 1
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
## 2024-09-07 NOTE — ED Notes (Signed)
 Date and time results received: 09/07/24 1720 (use smartphrase .now to insert current time)  Test: lactic acid Critical Value: 2.4  Name of Provider Notified: [place]

## 2024-09-07 NOTE — ED Triage Notes (Signed)
 Pt bib [facility]. Pt has hx of lung cancer , pneumonia and lung surgery. Ems states she has labored and decreased breath sounds at 97%.  1 duoneb Productive cough 2g mag 125mg  solumedrol 22 Lhand Complains of right side thoracic pain
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
## 2024-09-08 ENCOUNTER — Encounter (HOSPITAL_COMMUNITY): Payer: Self-pay | Admitting: Family Medicine

## 2024-09-08 ENCOUNTER — Observation Stay (HOSPITAL_COMMUNITY)

## 2024-09-08 DIAGNOSIS — I2609 Other pulmonary embolism with acute cor pulmonale: Secondary | ICD-10-CM

## 2024-09-08 DIAGNOSIS — Z794 Long term (current) use of insulin: Secondary | ICD-10-CM | POA: Diagnosis not present

## 2024-09-08 DIAGNOSIS — C3411 Malignant neoplasm of upper lobe, right bronchus or lung: Secondary | ICD-10-CM | POA: Diagnosis not present

## 2024-09-08 DIAGNOSIS — E119 Type 2 diabetes mellitus without complications: Secondary | ICD-10-CM | POA: Diagnosis not present

## 2024-09-08 DIAGNOSIS — E872 Acidosis, unspecified: Secondary | ICD-10-CM | POA: Diagnosis present

## 2024-09-08 DIAGNOSIS — I2699 Other pulmonary embolism without acute cor pulmonale: Secondary | ICD-10-CM | POA: Diagnosis not present

## 2024-09-08 LAB — ECHOCARDIOGRAM COMPLETE
Area-P 1/2: 4.06 cm2
Height: 62 in
S' Lateral: 2.6 cm
Weight: 2896 [oz_av]

## 2024-09-08 LAB — GLUCOSE, CAPILLARY
Glucose-Capillary: 150 mg/dL — ABNORMAL HIGH (ref 70–99)
Glucose-Capillary: 135 mg/dL — ABNORMAL HIGH (ref 70–99)
Glucose-Capillary: 132 mg/dL — ABNORMAL HIGH (ref 70–99)
Glucose-Capillary: 122 mg/dL — ABNORMAL HIGH (ref 70–99)

## 2024-09-08 LAB — CBC
HCT: 33.5 % — ABNORMAL LOW (ref 36.0–46.0)
Hemoglobin: 11.1 g/dL — ABNORMAL LOW (ref 12.0–15.0)
MCH: 32.8 pg (ref 26.0–34.0)
MCHC: 33.1 g/dL (ref 30.0–36.0)
MCV: 99.1 fL (ref 80.0–100.0)
Platelets: 313 K/uL (ref 150–400)
RBC: 3.38 MIL/uL — ABNORMAL LOW (ref 3.87–5.11)
RDW: 15.7 % — ABNORMAL HIGH (ref 11.5–15.5)
WBC: 6.6 K/uL (ref 4.0–10.5)
nRBC: 0 % (ref 0.0–0.2)

## 2024-09-08 LAB — BASIC METABOLIC PANEL WITH GFR
Anion gap: 15 (ref 5–15)
BUN: <5 mg/dL — ABNORMAL LOW (ref 6–20)
CO2: 22 mmol/L (ref 22–32)
Calcium: 8.1 mg/dL — ABNORMAL LOW (ref 8.9–10.3)
Chloride: 100 mmol/L (ref 98–111)
Creatinine, Ser: 0.69 mg/dL (ref 0.44–1.00)
GFR, Estimated: >60 mL/min (ref >60)
Glucose, Bld: 218 mg/dL — ABNORMAL HIGH (ref 70–99)
Potassium: 3.9 mmol/L (ref 3.5–5.1)
Sodium: 137 mmol/L (ref 135–145)

## 2024-09-08 LAB — LACTIC ACID, PLASMA
Lactic Acid, Venous: 5 mmol/L (ref 0.5–1.9)
Lactic Acid, Venous: 2.6 mmol/L (ref 0.5–1.9)

## 2024-09-08 MED ORDER — ALUM & MAG HYDROXIDE-SIMETH 200-200-20 MG/5ML PO SUSP
15.0000 mL | Freq: Once | ORAL | Status: AC | PRN
  Administered 2024-09-08: 15 mL via ORAL
  Filled 2024-09-08: qty 30

## 2024-09-08 MED ORDER — PROCHLORPERAZINE MALEATE 10 MG PO TABS: 10.0000 mg | ORAL_TABLET | Freq: Four times a day (QID) | ORAL | Status: DC | PRN

## 2024-09-08 MED ORDER — METHOCARBAMOL 500 MG PO TABS
500.0000 mg | ORAL_TABLET | Freq: Three times a day (TID) | ORAL | Status: DC | PRN
  Administered 2024-09-08 – 2024-09-10 (×5): 500 mg via ORAL
  Filled 2024-09-08 (×5): qty 1

## 2024-09-08 MED ORDER — DM-GUAIFENESIN ER 30-600 MG PO TB12
1.0000 | ORAL_TABLET | Freq: Two times a day (BID) | ORAL | Status: DC
  Administered 2024-09-08 – 2024-09-10 (×5): 1 via ORAL
  Filled 2024-09-08 (×5): qty 1

## 2024-09-08 MED ORDER — LACTATED RINGERS IV SOLN: INTRAVENOUS | Status: AC

## 2024-09-08 NOTE — Progress Notes (Addendum)
  Progress Note   Patient: Peggy Warren FMW:[id number] DOB: [birth year] DOA: 09/07/2024     0 DOS: the patient was seen and examined on 09/08/2024   Brief hospital course: VERONDA GABOR is a 58 y.o. female with medical history significant for hypertension, type 2 diabetes mellitus, COPD, and non-small cell lung cancer status post right upper lobectomy in September 2025 who presents with worsening shortness of breath, cough, and pleuritic chest pain x 2 days.  CTA chest in the ED showed moderate PE in multiple segmental branches of the right middle and lower lobes without evidence of right heart strain.  Pt was admitted for further evaluation and management as outlined in detail below.  Started on full dose Lovenox .   11/29 - Patient has been hemodynamically stable and on room air.  Has ongoing severe pleuritic chest pain requiring IV analgesics.     Assessment and Plan:  Acute pulmonary embolism  CTA chest showed moderate amount of PE in multiple segmental arteries on the right without right heart strain. --Continue therapeutic dose Lovenox  --Likely transition to DOAC on d/c, will get input from Heme-oncology given underlying malignancy --follow pending echo & U/S dvt study --pain control per orders PRN - still requiring IV medication for severe pleuritic pain  Lactic acidosis - likely from malignancy, chemo, acute PE, hyperglycemia --Will give IV fluids today & repeat lactate this afternoon --No evidence for infection / sepsis at this time, monitor    Non-small cell Lung cancer  - Stage IIA  non-small lung cancer s/p right upper lobectomy, on adjuvant chemotherapy under the care of Dr. Sherrod      COPD - stable Not in exacerbation  - Continue LAMA-LABA and as-needed DuoNebs   Hypertension  - Continue Norvasc  and lisinopril  - Titrate regimen   Type II DM with hyperglycemia - A1c was 11.9% in November 2025  - Check CBGs, continue long- and short-acting insulin        Subjective: Pt seen awake resting in bed this AM. Reports ongoing right-sided chest pain worse with inspirations. Having congested cough but too much pain to produce phlegm. No fever/chills.  Requests mucinex  tablets.   Physical Exam: Vitals:   09/07/24 2353 09/08/24 0729 09/08/24 0735 09/08/24 1153  BP: (!) 142/73  127/72 116/65  Pulse: 80 74 64 73  Resp: 20 18 18 18   Temp: 97.9 F (36.6 C)     TempSrc: Oral     SpO2: 96% 95% 100% 97%  Weight:      Height:       General exam: awake, alert, no acute distress but appears in pain with wincing  HEENT: moist mucus membranes, hearing grossly normal  Respiratory system: CTAB but diminished on right, no wheezes or rhonchi, normal respiratory effort at rest Cardiovascular system: normal S1/S2, RRR   Gastrointestinal system: soft, NT, ND Central nervous system: A&O x 3. no gross focal neurologic deficits, normal speech Extremities: moves all, no edema, normal tone Skin: dry, intact, normal temperature, Psychiatry: normal mood, congruent affect, judgement and insight appear normal   Data Reviewed:  Notable labs --   Family Communication: daughter updated on speaker phone during encounter on rounds this AM  Disposition: Status is: Observation Remains admitted requiring IV analgesics for pain with acute PE   Planned Discharge Destination: Home    Time spent: 45 minutes  Author: Burnard DELENA Cunning, DO 09/08/2024 3:19 PM  For on call review [email or web address].

## 2024-09-08 NOTE — Progress Notes (Signed)
  Echocardiogram 2D Echocardiogram has been performed.  Koleen KANDICE Popper, RDCS 09/08/2024, 1:04 PM

## 2024-09-08 NOTE — Plan of Care (Signed)

## 2024-09-09 ENCOUNTER — Observation Stay (HOSPITAL_COMMUNITY)

## 2024-09-09 ENCOUNTER — Encounter: Payer: Self-pay | Admitting: Internal Medicine

## 2024-09-09 ENCOUNTER — Other Ambulatory Visit (HOSPITAL_COMMUNITY): Payer: Self-pay

## 2024-09-09 DIAGNOSIS — R0602 Shortness of breath: Secondary | ICD-10-CM | POA: Diagnosis not present

## 2024-09-09 DIAGNOSIS — R609 Edema, unspecified: Secondary | ICD-10-CM | POA: Diagnosis not present

## 2024-09-09 DIAGNOSIS — Z8249 Family history of ischemic heart disease and other diseases of the circulatory system: Secondary | ICD-10-CM | POA: Diagnosis not present

## 2024-09-09 DIAGNOSIS — K219 Gastro-esophageal reflux disease without esophagitis: Secondary | ICD-10-CM | POA: Diagnosis not present

## 2024-09-09 DIAGNOSIS — Z7984 Long term (current) use of oral hypoglycemic drugs: Secondary | ICD-10-CM | POA: Diagnosis not present

## 2024-09-09 DIAGNOSIS — Z794 Long term (current) use of insulin: Secondary | ICD-10-CM | POA: Diagnosis not present

## 2024-09-09 DIAGNOSIS — E872 Acidosis, unspecified: Secondary | ICD-10-CM | POA: Diagnosis not present

## 2024-09-09 DIAGNOSIS — C3411 Malignant neoplasm of upper lobe, right bronchus or lung: Secondary | ICD-10-CM | POA: Diagnosis not present

## 2024-09-09 DIAGNOSIS — I10 Essential (primary) hypertension: Secondary | ICD-10-CM | POA: Diagnosis not present

## 2024-09-09 DIAGNOSIS — E119 Type 2 diabetes mellitus without complications: Secondary | ICD-10-CM | POA: Diagnosis not present

## 2024-09-09 DIAGNOSIS — I2699 Other pulmonary embolism without acute cor pulmonale: Secondary | ICD-10-CM | POA: Diagnosis not present

## 2024-09-09 DIAGNOSIS — E1165 Type 2 diabetes mellitus with hyperglycemia: Secondary | ICD-10-CM | POA: Diagnosis not present

## 2024-09-09 DIAGNOSIS — Z1152 Encounter for screening for COVID-19: Secondary | ICD-10-CM | POA: Diagnosis not present

## 2024-09-09 DIAGNOSIS — Z902 Acquired absence of lung [part of]: Secondary | ICD-10-CM | POA: Diagnosis not present

## 2024-09-09 DIAGNOSIS — M316 Other giant cell arteritis: Secondary | ICD-10-CM | POA: Diagnosis not present

## 2024-09-09 DIAGNOSIS — Z7982 Long term (current) use of aspirin: Secondary | ICD-10-CM | POA: Diagnosis not present

## 2024-09-09 DIAGNOSIS — Z79899 Other long term (current) drug therapy: Secondary | ICD-10-CM | POA: Diagnosis not present

## 2024-09-09 DIAGNOSIS — J449 Chronic obstructive pulmonary disease, unspecified: Secondary | ICD-10-CM | POA: Diagnosis not present

## 2024-09-09 DIAGNOSIS — Z823 Family history of stroke: Secondary | ICD-10-CM | POA: Diagnosis not present

## 2024-09-09 DIAGNOSIS — F17210 Nicotine dependence, cigarettes, uncomplicated: Secondary | ICD-10-CM | POA: Diagnosis not present

## 2024-09-09 DIAGNOSIS — Z8673 Personal history of transient ischemic attack (TIA), and cerebral infarction without residual deficits: Secondary | ICD-10-CM | POA: Diagnosis not present

## 2024-09-09 LAB — GLUCOSE, CAPILLARY
Glucose-Capillary: 161 mg/dL — ABNORMAL HIGH (ref 70–99)
Glucose-Capillary: 101 mg/dL — ABNORMAL HIGH (ref 70–99)
Glucose-Capillary: 149 mg/dL — ABNORMAL HIGH (ref 70–99)
Glucose-Capillary: 123 mg/dL — ABNORMAL HIGH (ref 70–99)

## 2024-09-09 LAB — BASIC METABOLIC PANEL WITH GFR
Anion gap: 10 (ref 5–15)
BUN: 6 mg/dL (ref 6–20)
CO2: 27 mmol/L (ref 22–32)
Calcium: 8.1 mg/dL — ABNORMAL LOW (ref 8.9–10.3)
Chloride: 101 mmol/L (ref 98–111)
Creatinine, Ser: 0.51 mg/dL (ref 0.44–1.00)
GFR, Estimated: >60 mL/min (ref >60)
Glucose, Bld: 103 mg/dL — ABNORMAL HIGH (ref 70–99)
Potassium: 3.9 mmol/L (ref 3.5–5.1)
Sodium: 138 mmol/L (ref 135–145)

## 2024-09-09 LAB — CBC
HCT: 32.2 % — ABNORMAL LOW (ref 36.0–46.0)
Hemoglobin: 10.6 g/dL — ABNORMAL LOW (ref 12.0–15.0)
MCH: 33.4 pg (ref 26.0–34.0)
MCHC: 32.9 g/dL (ref 30.0–36.0)
MCV: 101.6 fL — ABNORMAL HIGH (ref 80.0–100.0)
Platelets: 335 K/uL (ref 150–400)
RBC: 3.17 MIL/uL — ABNORMAL LOW (ref 3.87–5.11)
RDW: 16 % — ABNORMAL HIGH (ref 11.5–15.5)
WBC: 6.6 K/uL (ref 4.0–10.5)
nRBC: 0 % (ref 0.0–0.2)

## 2024-09-09 LAB — SEDIMENTATION RATE: Sed Rate: 28 mm/h — ABNORMAL HIGH (ref 0–22)

## 2024-09-09 LAB — C-REACTIVE PROTEIN: CRP: 1.1 mg/dL — ABNORMAL HIGH (ref <1.0)

## 2024-09-09 MED ORDER — OXYCODONE HCL 5 MG PO TABS
5.0000 mg | ORAL_TABLET | ORAL | 0 refills | Status: DC | PRN
  Filled 2024-09-09: qty 30, 5d supply, fill #0

## 2024-09-09 MED ORDER — APIXABAN 5 MG PO TABS: 5.0000 mg | ORAL_TABLET | Freq: Two times a day (BID) | ORAL | Status: DC

## 2024-09-09 MED ORDER — APIXABAN (ELIQUIS) VTE STARTER PACK (10MG AND 5MG)
ORAL_TABLET | ORAL | 0 refills | Status: DC
  Filled 2024-09-09: qty 74, 30d supply, fill #0

## 2024-09-09 MED ORDER — APIXABAN 5 MG PO TABS
10.0000 mg | ORAL_TABLET | Freq: Two times a day (BID) | ORAL | Status: DC
  Administered 2024-09-09 – 2024-09-10 (×3): 10 mg via ORAL
  Filled 2024-09-09 (×3): qty 2

## 2024-09-09 MED ORDER — DM-GUAIFENESIN ER 30-600 MG PO TB12
1.0000 | ORAL_TABLET | Freq: Two times a day (BID) | ORAL | 0 refills | Status: AC
  Filled 2024-09-09: qty 20, 10d supply, fill #0

## 2024-09-09 NOTE — Discharge Instructions (Signed)
Information on my medicine - ELIQUIS? (apixaban) ? ?This medication education was reviewed with me or my healthcare representative as part of my discharge preparation.  ? ?Why was Eliquis? prescribed for you? ?Eliquis? was prescribed for you to reduce the risk of forming blood clots that can cause a stroke if you have a medical condition called atrial fibrillation (a type of irregular heartbeat) OR to reduce the risk of a blood clots forming after orthopedic surgery. ? ?What do You need to know about Eliquis? ? ?Take your Eliquis? TWICE DAILY - one tablet in the morning and one tablet in the evening with or without food.  It would be best to take the doses about the same time each day. ? ?If you have difficulty swallowing the tablet whole please discuss with your pharmacist how to take the medication safely. ? ?Take Eliquis? exactly as prescribed by your doctor and DO NOT stop taking Eliquis? without talking to the doctor who prescribed the medication.  Stopping may increase your risk of developing a new clot or stroke.  Refill your prescription before you run out. ? ?After discharge, you should have regular check-up appointments with your healthcare provider that is prescribing your Eliquis?.  In the future your dose may need to be changed if your kidney function or weight changes by a significant amount or as you get older. ? ?What do you do if you miss a dose? ?If you miss a dose, take it as soon as you remember on the same day and resume taking twice daily.  Do not take more than one dose of ELIQUIS at the same time. ? ?Important Safety Information ?A possible side effect of Eliquis? is bleeding. You should call your healthcare provider right away if you experience any of the following: ?Bleeding from an injury or your nose that does not stop. ?Unusual colored urine (red or dark brown) or unusual colored stools (red or black). ?Unusual bruising for unknown reasons. ?A serious fall or if you hit your head (even  if there is no bleeding). ? ?Some medicines may interact with Eliquis? and might increase your risk of bleeding or clotting while on Eliquis?. To help avoid this, consult your healthcare provider or pharmacist prior to using any new prescription or non-prescription medications, including herbals, vitamins, non-steroidal anti-inflammatory drugs (NSAIDs) and supplements. ? ?This website has more information on Eliquis? (apixaban): http://www.eliquis.com/eliquis/home ?  ?

## 2024-09-09 NOTE — Progress Notes (Signed)
 Progress Note   Patient: Peggy Warren FMW:[id number] DOB: [birth year] DOA: 09/07/2024     0 DOS: the patient was seen and examined on 09/09/2024   Brief hospital course: Peggy Warren is a 58 y.o. female with medical history significant for hypertension, type 2 diabetes mellitus, COPD, and non-small cell lung cancer status post right upper lobectomy in September 2025 who presents with worsening shortness of breath, cough, and pleuritic chest pain x 2 days.  CTA chest in the ED showed moderate PE in multiple segmental branches of the right middle and lower lobes without evidence of right heart strain.  Pt was admitted for further evaluation and management as outlined in detail below.  Started on full dose Lovenox .   11/29 - Patient has been hemodynamically stable and on room air.  Has ongoing severe pleuritic chest pain requiring IV analgesics.     Assessment and Plan:  Acute pulmonary embolism  CTA chest showed moderate amount of PE in multiple segmental arteries on the right without right heart strain.  Echo stable also no right-sided heart strain, ED 70-75% --started therapeutic dose Lovenox  on admission --Transition to Eliquis today --U/S DVT is pending --pain control per orders PRN  Lactic acidosis - likely from malignancy, chemo, acute PE, hyperglycemia --Given IV fluids & improved --Repeat lactate level --No evidence for infection / sepsis at this time, monitor  Right-sided headaches - with temporal tenderness and right-sided vision changes Obviously concerning history for temporal arteritis that may warrant biopsy.  Cluster headache most likely other differential --Check ESR and CRP --Consider temporal biopsy to rule out TA   Non-small cell Lung cancer  - Stage IIA  non-small lung cancer s/p right upper lobectomy, on adjuvant chemotherapy under the care of Dr. Sherrod      COPD - stable Not in exacerbation  - Continue LAMA-LABA and as-needed DuoNebs    Hypertension  - Continue Norvasc  and lisinopril  - Titrate regimen   Type II DM with hyperglycemia - A1c was 11.9% in November 2025  - Check CBGs, continue long- and short-acting insulin       Subjective: Pt seen awake resting in bed this AM. She reports breathing is okay, not too dyspneic to and from bathroom but otherwise not walking around much yet.  Continues to have right-sided pleuritic chest pain overall controlled with medication.  She reports frequent episodes over about 3 weeks of right-sided temporal pain, sudden onset lasting a few minutes each time with associated vision changes / blurriness.     Physical Exam: Vitals:   09/09/24 0804 09/09/24 0912 09/09/24 1131 09/09/24 1638  BP: 126/60  (!) 144/84 126/68  Pulse: 65 64 68 72  Resp: 17 18 16 16   Temp: 98.4 F (36.9 C)  98.4 F (36.9 C) 98.7 F (37.1 C)  TempSrc: Oral  Oral Oral  SpO2: 96% 100% 96% 94%  Weight:      Height:       General exam: awake, alert, no acute distress but appears in pain with wincing  HEENT: mild right-sided temporal tenderness of palpation, moist mucus membranes, hearing grossly normal  Respiratory system: CTAB but diminished on right, no wheezes or rhonchi, normal respiratory effort at rest Cardiovascular system: normal S1/S2, RRR   Gastrointestinal system: soft, NT, ND Central nervous system: A&O x 3. no gross focal neurologic deficits, normal speech Extremities: moves all, no edema, normal tone Skin: dry, intact, normal temperature, Psychiatry: normal mood, congruent affect, judgement and insight appear normal   Data Reviewed:  Notable labs --   Nomal BMP other than glucose 103, Ca 8.1  CBC with Hbg stable 10.6  Last lactate 2.6 repeat pending  Family Communication: daughter updated on speaker phone during encounter on rounds this AM  Disposition: Status is: Inpatient Remains admitted requiring IV analgesics for pain with acute PE.  Ongoing evaluation of temporal headaches  with vision changes   Planned Discharge Destination: Home    Time spent: 45 minutes  Author: Burnard DELENA Cunning, DO 09/09/2024 5:13 PM  For on call review [email or web address].

## 2024-09-09 NOTE — Progress Notes (Signed)
 Right lower extremity venous duplex has been completed. Preliminary results can be found in [facility] Proc through chart review.   09/09/24 2:22 PM Cathlyn Collet RVT

## 2024-09-09 NOTE — Plan of Care (Signed)

## 2024-09-10 ENCOUNTER — Encounter: Payer: Self-pay | Admitting: Internal Medicine

## 2024-09-10 ENCOUNTER — Inpatient Hospital Stay (HOSPITAL_COMMUNITY)

## 2024-09-10 ENCOUNTER — Other Ambulatory Visit (HOSPITAL_COMMUNITY): Payer: Self-pay

## 2024-09-10 ENCOUNTER — Telehealth (HOSPITAL_COMMUNITY): Payer: Self-pay

## 2024-09-10 DIAGNOSIS — R519 Headache, unspecified: Secondary | ICD-10-CM | POA: Diagnosis not present

## 2024-09-10 DIAGNOSIS — I2699 Other pulmonary embolism without acute cor pulmonale: Secondary | ICD-10-CM | POA: Diagnosis not present

## 2024-09-10 DIAGNOSIS — E119 Type 2 diabetes mellitus without complications: Secondary | ICD-10-CM | POA: Diagnosis not present

## 2024-09-10 LAB — BASIC METABOLIC PANEL WITH GFR
Anion gap: 8 (ref 5–15)
BUN: 6 mg/dL (ref 6–20)
CO2: 27 mmol/L (ref 22–32)
Calcium: 8.6 mg/dL — ABNORMAL LOW (ref 8.9–10.3)
Chloride: 102 mmol/L (ref 98–111)
Creatinine, Ser: 0.49 mg/dL (ref 0.44–1.00)
GFR, Estimated: >60 mL/min (ref >60)
Glucose, Bld: 109 mg/dL — ABNORMAL HIGH (ref 70–99)
Potassium: 4 mmol/L (ref 3.5–5.1)
Sodium: 137 mmol/L (ref 135–145)

## 2024-09-10 LAB — LACTIC ACID, PLASMA: Lactic Acid, Venous: 0.9 mmol/L (ref 0.5–1.9)

## 2024-09-10 LAB — CBC
HCT: 33 % — ABNORMAL LOW (ref 36.0–46.0)
Hemoglobin: 10.9 g/dL — ABNORMAL LOW (ref 12.0–15.0)
MCH: 33.6 pg (ref 26.0–34.0)
MCHC: 33 g/dL (ref 30.0–36.0)
MCV: 101.9 fL — ABNORMAL HIGH (ref 80.0–100.0)
Platelets: 378 K/uL (ref 150–400)
RBC: 3.24 MIL/uL — ABNORMAL LOW (ref 3.87–5.11)
RDW: 15.9 % — ABNORMAL HIGH (ref 11.5–15.5)
WBC: 6.3 K/uL (ref 4.0–10.5)
nRBC: 0 % (ref 0.0–0.2)

## 2024-09-10 LAB — GLUCOSE, CAPILLARY
Glucose-Capillary: 170 mg/dL — ABNORMAL HIGH (ref 70–99)
Glucose-Capillary: 151 mg/dL — ABNORMAL HIGH (ref 70–99)
Glucose-Capillary: 104 mg/dL — ABNORMAL HIGH (ref 70–99)
Glucose-Capillary: 141 mg/dL — ABNORMAL HIGH (ref 70–99)

## 2024-09-10 MED ORDER — DEXTROMETHORPHAN POLISTIREX ER 30 MG/5ML PO SUER: 30.0000 mg | Freq: Two times a day (BID) | ORAL | Status: DC | PRN

## 2024-09-10 MED ORDER — GUAIFENESIN ER 600 MG PO TB12: 1200.0000 mg | ORAL_TABLET | Freq: Two times a day (BID) | ORAL | Status: DC

## 2024-09-10 MED ORDER — POLYETHYLENE GLYCOL 3350 17 GM/SCOOP PO POWD
17.0000 g | Freq: Every day | ORAL | 0 refills | Status: AC
  Filled 2024-09-10: qty 238, 14d supply, fill #0

## 2024-09-10 MED ORDER — POLYETHYLENE GLYCOL 3350 17 G PO PACK
17.0000 g | PACK | Freq: Every day | ORAL | Status: DC
  Administered 2024-09-10: 17 g via ORAL
  Filled 2024-09-10: qty 1

## 2024-09-10 MED FILL — Fosaprepitant Dimeglumine For IV Infusion 150 MG (Base Eq): INTRAVENOUS | Qty: 5 | Status: AC

## 2024-09-10 NOTE — Discharge Summary (Signed)
 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 [facility] Course: 58 year old with past medical history significant for hypertension, diabetes type 2, COPD, non-small cell lung cancer status post right upper lobectomy September 2025 presented with worsening shortness of breath, cough and pleuritic chest pain for 2 days. CTA chest in the ED showed moderate PE with multiple segmental branches of the right middle and lower lobe without evidence of right heart strain. Patient was admitted for evaluation and management. Started on full dose Lovenox . She has been still requiring IV analgesic for chest pain.   Assessment and Plan: 1-Acute pulmonary PE: - CTA showed moderate amount of PE and multiple segmental arteries of the right without right heart strain. -Echo no right-sided heart strain, ejection fraction 70% - Treated initially with Lovenox  and now transition to Eliquis.  Vitals stable, BP stable.  Stable for discharge.    Lactic acidosis: - Likely from malignancy, chemo acute PE Resolved   Right-sided headache: - ESR CRP mildly elevated.  Denies headaches right now, she has intermittent episodes. Last few minutes, multiples episodes st that time. Currently denies headaches. Cover her right eye and side head. Associated with some blurry vision/  Discussed with  Neurology. Plan to check Temporal artery US  if negative follow up out patient with neurology. Also check CT head which was negative. Patient had MRI brain on August which was negative - ESR 28, CRP 1.1  Final report pending. Discussed with Dr Rosemarie, he review imagine, looks normal. Ok to discharge patient.   Non-small cell lung cancer: Stage IIa status post right upper lobectomy and adjuvant chemotherapy Follows by Dr. Gatha   COPD Continue LAMA LABA and as needed DuoNeb   Hypertension: -Continue with BP meds   Diabetes type 2: -Continue with Insulin .         Consultants: Neurology, phone consultation.  Procedures performed: temporal artery US  Disposition: Home Diet recommendation:  Carb modified diet DISCHARGE MEDICATION: Allergies as of 09/10/2024   No Known Allergies      Medication List     STOP taking these medications    aspirin  EC 81 MG tablet   HYDROcodone -acetaminophen  5-325 MG tablet Commonly known as: NORCO/VICODIN       TAKE these medications    acetaminophen  500 MG tablet Commonly known as: TYLENOL  Take 500-1,000 mg by mouth every 6 (six) hours as needed (pain.).   albuterol  108 (90 Base) MCG/ACT inhaler Commonly known as: VENTOLIN  HFA Inhale 1-2 puffs into the lungs every 6 (six) hours as needed for wheezing or shortness of breath.   amLODipine  10 MG tablet Commonly known as: NORVASC  Take 1 tablet (10 mg total) by mouth daily.   atorvastatin  40 MG tablet Commonly known as: LIPITOR Take 1 tablet (40 mg total) by mouth daily.   calcium  carbonate 500 MG chewable tablet Commonly known as: TUMS -  dosed in mg elemental calcium  Chew 2 tablets by mouth as needed for indigestion or heartburn.   Eliquis DVT/PE Starter Pack Generic drug: Apixaban Starter Pack (10mg  and 5mg ) Take 2 tablets (10 mg total) by mouth 2 (two) times daily for 7 days, THEN 1 tablet (5 mg total) 2 (two) times daily for 23 days. Start taking on: September 09, 2024    FreeStyle Libre 3 Plus Sensor Misc Change sensor every 15 days.   FreeStyle Libre 3 Reader Devi Use to monitor your blood glucose   FT Mucus Relief DM 30-600 MG Tb12 Take 1 tablet by mouth 2 (two) times daily.   Insulin  Aspart FlexPen 100 UNIT/ML Commonly known as: NOVOLOG  Inject 13 Units into the skin 3 (three) times daily before meals.   Lantus  SoloStar 100 UNIT/ML Solostar Pen Generic drug: insulin  glargine Inject 35 Units into the skin 2 (two) times daily.   lisinopril  5 MG tablet Commonly known as: ZESTRIL  Take 1 tablet (5 mg total) by mouth daily.   metFORMIN  500 MG tablet Commonly known as: GLUCOPHAGE  Take 1 tablet (500 mg total) by mouth 2 (two) times daily with a meal for 14 days, THEN 2 tablets (1,000 mg total) 2 (two) times daily with a meal. Start taking on: August 14, 2024   methocarbamol  500 MG tablet Commonly known as: ROBAXIN  Take 1 tablet (500 mg total) by mouth every 8 (eight) hours as needed for muscle spasms.   multivitamin tablet Take 1 tablet by mouth as needed.   ondansetron  8 MG tablet Commonly known as: Zofran  Take 1 tablet (8 mg total) by mouth every 8 (eight) hours as needed for nausea or vomiting. Start on the third day after carboplatin .   oxyCODONE  5 MG immediate release tablet Commonly known as: Oxy IR/ROXICODONE  Take 1 tablet (5 mg total) by mouth every 4 (four) hours as needed for moderate pain (pain score 4-6).   prochlorperazine  10 MG tablet Commonly known as: COMPAZINE  Take 1 tablet (10 mg total) by mouth every 6 (six) hours as needed.   TechLite Pen Needles 32G X 4 MM Misc Generic drug: Insulin  Pen Needle Use 5 (five) times daily.   umeclidinium-vilanterol 62.5-25 MCG/ACT Aepb Commonly known as: Anoro Ellipta  Inhale 1 puff into the lungs daily.   varenicline  1 MG tablet Commonly known as: Chantix  Continuing Month Pak Take 1 tablet (1 mg total) by mouth 2 (two) times daily.        Discharge Exam: Filed Weights    09/07/24 1642  Weight: 82.1 kg   General; NAD  Condition at discharge: stable  The results of significant diagnostics from this hospitalization (including imaging, microbiology, ancillary and laboratory) are listed below for reference.   Imaging Studies: CT HEAD WO CONTRAST ( ) Result Date: 09/10/2024 EXAM: CT HEAD WITHOUT 09/10/2024 10:54:00 AM TECHNIQUE: CT of the head was performed without the administration of intravenous contrast. Automated exposure control, iterative reconstruction, and/or weight based adjustment of the mA/kV was utilized to reduce the radiation dose to as low as reasonably achievable. COMPARISON: Head CT 07/29/2023 and MRI 05/31/2024. CLINICAL HISTORY: Headache, classic migraine. FINDINGS: BRAIN AND VENTRICLES: There is no evidence of an acute infarct, intracranial hemorrhage, mass, midline shift, hydrocephalus, or extra-axial fluid collection. Cerebral volume is normal. A partially empty sella is noted. Minimal cerebral white matter hypodensities are nonspecific but compatible with chronic small vessel ischemic disease. ORBITS: No acute abnormality. SINUSES AND MASTOIDS: No acute abnormality. SOFT TISSUES AND SKULL: No acute skull fracture. No acute soft tissue  abnormality. IMPRESSION: 1. No acute intracranial abnormality. Electronically signed by: Dasie Hamburg MD 09/10/2024 11:37 AM EST RP Workstation: [id number]   VAS US  LOWER EXTREMITY VENOUS (DVT) Result Date: 09/10/2024  Lower Venous DVT Study Patient Name:  BERNYCE BRIMLEY  Date of Exam:   09/09/2024 Medical Rec #: [id number]         Accession #:    [id number] Date of Birth: 1965-10-30         Patient Gender: F Patient Age:   60 years Exam Location:  [facility] Procedure:      VAS US  LOWER EXTREMITY VENOUS (DVT) Referring Phys: TIMOTHY OPYD --------------------------------------------------------------------------------  Indications: Edema.  Risk Factors: Cancer. Anticoagulation: Eliquis . Comparison Study: No  prior studies. Performing Technologist: Cordella Collet RVT  Examination Guidelines: A complete evaluation includes B-mode imaging, spectral Doppler, color Doppler, and power Doppler as needed of all accessible portions of each vessel. Bilateral testing is considered an integral part of a complete examination. Limited examinations for reoccurring indications may be performed as noted. The reflux portion of the exam is performed with the patient in reverse Trendelenburg.  +---------+---------------+---------+-----------+----------+--------------+ RIGHT    CompressibilityPhasicitySpontaneityPropertiesThrombus Aging +---------+---------------+---------+-----------+----------+--------------+ CFV      Full           Yes      Yes                                 +---------+---------------+---------+-----------+----------+--------------+ SFJ      Full                                                        +---------+---------------+---------+-----------+----------+--------------+ FV Prox  Full                                                        +---------+---------------+---------+-----------+----------+--------------+ FV Mid   Full                                                        +---------+---------------+---------+-----------+----------+--------------+ FV DistalFull                                                        +---------+---------------+---------+-----------+----------+--------------+ PFV      Full                                                        +---------+---------------+---------+-----------+----------+--------------+ POP      Full           Yes      Yes                                 +---------+---------------+---------+-----------+----------+--------------+  PTV      Full                                                        +---------+---------------+---------+-----------+----------+--------------+ PERO     Full                                                         +---------+---------------+---------+-----------+----------+--------------+   +----+---------------+---------+-----------+----------+--------------+ LEFTCompressibilityPhasicitySpontaneityPropertiesThrombus Aging +----+---------------+---------+-----------+----------+--------------+ CFV Full           Yes      Yes                                 +----+---------------+---------+-----------+----------+--------------+    Summary: RIGHT: - There is no evidence of deep vein thrombosis in the lower extremity.  - No cystic structure found in the popliteal fossa.  LEFT: - No evidence of common femoral vein obstruction.   *See table(s) above for measurements and observations. Electronically signed by Debby Robertson on 09/10/2024 at 9:16:45 AM.    Final    ECHOCARDIOGRAM COMPLETE Result Date: 09/08/2024    ECHOCARDIOGRAM REPORT   Patient Name:   Peggy Warren Date of Exam: 09/08/2024 Medical Rec #:  [id number]        Height:       62.0 in Accession #:    [id number]       Weight:       181.0 lb Date of Birth:  July 31, 1966        BSA:          1.832 m Patient Age:    58 years         BP:           127/72 mmHg Patient Gender: F                HR:           71 bpm. Exam Location:  Inpatient Procedure: 2D Echo, Cardiac Doppler and Color Doppler (Both Spectral and Color            Flow Doppler were utilized during procedure). Indications:    Pulmonary Embolus I26.09  History:        Patient has prior history of Echocardiogram examinations, most                 recent 07/29/2023. Pulmonary embolus, COPD and TIA; Risk                 Factors:Diabetes, Current Smoker and PSA.  Sonographer:    Koleen Popper RDCS Referring Phys: [id number] KELLY A GRIFFITH IMPRESSIONS  1. Left ventricular ejection fraction, by estimation, is 70 to 75%. The left ventricle has hyperdynamic function. The left ventricle has no regional wall motion abnormalities. There is mild left ventricular  hypertrophy. Left ventricular diastolic parameters were normal.  2. Right ventricular systolic function is normal. The right ventricular size is normal. Tricuspid regurgitation signal is inadequate for assessing PA pressure.  3. The mitral valve is grossly normal. No evidence of mitral valve regurgitation.  4. The aortic valve is tricuspid. Aortic valve regurgitation  is not visualized.  5. The inferior vena cava is normal in size with <50% respiratory variability, suggesting right atrial pressure of 8 mmHg. Comparison(s): Changes from prior study are noted. 07/29/2023: LVEF 60-65%. FINDINGS  Left Ventricle: Left ventricular ejection fraction, by estimation, is 70 to 75%. The left ventricle has hyperdynamic function. The left ventricle has no regional wall motion abnormalities. The left ventricular internal cavity size was normal in size. There is mild left ventricular hypertrophy. Left ventricular diastolic parameters were normal. Right Ventricle: The right ventricular size is normal. No increase in right ventricular wall thickness. Right ventricular systolic function is normal. Tricuspid regurgitation signal is inadequate for assessing PA pressure. Left Atrium: Left atrial size was normal in size. Right Atrium: Right atrial size was normal in size. Pericardium: There is no evidence of pericardial effusion. Mitral Valve: The mitral valve is grossly normal. No evidence of mitral valve regurgitation. Tricuspid Valve: The tricuspid valve is grossly normal. Tricuspid valve regurgitation is trivial. Aortic Valve: The aortic valve is tricuspid. Aortic valve regurgitation is not visualized. Pulmonic Valve: The pulmonic valve was normal in structure. Pulmonic valve regurgitation is not visualized. Aorta: The aortic root and ascending aorta are structurally normal, with no evidence of dilitation. Venous: The inferior vena cava is normal in size with less than 50% respiratory variability, suggesting right atrial pressure of 8  mmHg. IAS/Shunts: No atrial level shunt detected by color flow Doppler.  LEFT VENTRICLE PLAX 2D LVIDd:         4.70 cm   Diastology LVIDs:         2.60 cm   LV e' medial:    9.90 cm/s LV PW:         0.90 cm   LV E/e' medial:  9.2 LV IVS:        1.30 cm   LV e' lateral:   13.60 cm/s LVOT diam:     2.10 cm   LV E/e' lateral: 6.7 LV SV:         103 LV SV Index:   56 LVOT Area:     3.46 cm  RIGHT VENTRICLE             IVC RV S prime:     16.60 cm/s  IVC diam: 2.10 cm TAPSE (M-mode): 2.1 cm LEFT ATRIUM             Index LA diam:        4.30 cm 2.35 cm/m LA Vol (A2C):   49.1 ml 26.80 ml/m LA Vol (A4C):   38.1 ml 20.79 ml/m LA Biplane Vol: 43.4 ml 23.69 ml/m  AORTIC VALVE LVOT Vmax:   159.00 cm/s LVOT Vmean:  101.000 cm/s LVOT VTI:    0.297 m  AORTA Ao Root diam: 3.00 cm Ao Asc diam:  3.30 cm MITRAL VALVE MV Area (PHT): 4.06 cm    SHUNTS MV Decel Time: 187 msec    Systemic VTI:  0.30 m MV E velocity: 91.20 cm/s  Systemic Diam: 2.10 cm MV A velocity: 87.50 cm/s MV E/A ratio:  1.04 Vinie Maxcy MD Electronically signed by Vinie Maxcy MD Signature Date/Time: 09/08/2024/1:13:00 PM    Final    CT Angio Chest PE W and/or Wo Contrast Result Date: 09/07/2024 CLINICAL DATA:  History of lung cancer, pneumonia and lung surgery presented with labored and decreased breath sounds and right-sided chest pain. EXAM: CT ANGIOGRAPHY CHEST WITH CONTRAST TECHNIQUE: Multidetector CT imaging of the chest was performed using the standard protocol during  bolus administration of intravenous contrast. Multiplanar CT image reconstructions and MIPs were obtained to evaluate the vascular anatomy. RADIATION DOSE REDUCTION: This exam was performed according to the departmental dose-optimization program which includes automated exposure control, adjustment of the mA and/or kV according to patient size and/or use of iterative reconstruction technique. CONTRAST:  75mL OMNIPAQUE  IOHEXOL  350 MG/ML SOLN COMPARISON:  May 02, 2024 FINDINGS:  Cardiovascular: A pulmonary arteries are limited in evaluation secondary to suboptimal opacification with intravenous contrast. A moderate amount of intraluminal low attenuation is seen involving multiple proximal segmental middle lobe and lower lobe branches of the right pulmonary artery. Normal heart size without evidence of right heart strain (RV/LV ratio of 0.92). No pericardial effusion. Mediastinum/Nodes: No enlarged mediastinal, hilar, or axillary lymph nodes. Thyroid  gland, trachea, and esophagus demonstrate no significant findings. Lungs/Pleura: Surgical sutures are seen along the medial aspect of the mid and upper right lung, consistent with history of prior right upper lobectomy. Associated right-sided volume loss is seen. Mild linear scarring and/or atelectasis is noted along the lateral aspect of the right lower lobe. There is a very small posterolateral right pleural effusion. No acute infiltrate or pneumothorax is identified. Upper Abdomen: There is diffuse fatty infiltration of the liver parenchyma. Musculoskeletal: No chest wall abnormality. No acute or significant osseous findings. Review of the MIP images confirms the above findings. IMPRESSION: 1. Moderate amount of pulmonary embolism involving multiple proximal segmental middle lobe and lower lobe branches of the right pulmonary artery. 2. Evidence of prior right upper lobectomy. 3. Very small posterolateral right pleural effusion. 4. Hepatic steatosis. Electronically Signed   By: Suzen Dials M.D.   On: 09/07/2024 18:34   DG Chest Portable 1 View Result Date: 09/07/2024 CLINICAL DATA:  Cough, shortness of breath and wheezing. Surgery several months ago for a partial lobectomy. EXAM: PORTABLE CHEST 1 VIEW COMPARISON:  08/10/2024 and older studies.  CT, 05/02/2024. FINDINGS: Cardiac silhouette is normal in size. No mediastinal or hilar masses. Pulmonary anastomosis staples extend superiorly from the right hilum. Elevated right  hemidiaphragm due to postsurgical volume loss. Subtle hazy opacity at the right lateral lung base. Prominent bilateral interstitial markings. Lungs otherwise clear. No convincing pleural effusion and no pneumothorax. Skeletal structures are grossly intact. IMPRESSION: 1. Small area of opacity at the right lateral lung base. This could reflect infection or be due to atelectasis/scarring accentuated by the semi-erect positioning. 2. No other evidence of acute cardiopulmonary disease. 3. Previous right upper lobectomy. Electronically Signed   By: Alm Parkins M.D.   On: 09/07/2024 17:32   XR Lumbar Spine 2-3 Views Result Date: 09/05/2024 X-rays of the lumbar spine show mild degenerative changes with a grade 1 spondylolisthesis at L3-4.  XR HIP UNILAT W OR W/O PELVIS 2-3 VIEWS RIGHT Result Date: 09/05/2024 X-rays of the right hip show advanced degenerative joint disease with bone-on-bone joint space narrowing.  Kellgren-Lawrence stage IV.     Microbiology: Results for orders placed or performed during the hospital encounter of 09/07/24  Blood culture (routine x 2)     Status: None (Preliminary result)   Collection Time: 09/07/24  4:47 PM   Specimen: BLOOD LEFT HAND  Result Value Ref Range Status   Specimen Description BLOOD LEFT HAND  Final   Special Requests   Final    BOTTLES DRAWN AEROBIC AND ANAEROBIC Blood Culture adequate volume   Culture   Final    NO GROWTH 3 DAYS Performed at [facility], [address], [place],  [place] [address]    Report Status PENDING  Incomplete  Resp panel by RT-PCR (RSV, Flu A&B, Covid) Anterior Nasal Swab     Status: None   Collection Time: 09/07/24  5:06 PM   Specimen: Anterior Nasal Swab  Result Value Ref Range Status   SARS Coronavirus 2 by RT PCR NEGATIVE NEGATIVE Final   Influenza A by PCR NEGATIVE NEGATIVE Final   Influenza B by PCR NEGATIVE NEGATIVE Final    Comment: (NOTE) The Xpert Xpress SARS-CoV-2/FLU/RSV plus assay is intended as an  aid in the diagnosis of influenza from Nasopharyngeal swab specimens and should not be used as a sole basis for treatment. Nasal washings and aspirates are unacceptable for Xpert Xpress SARS-CoV-2/FLU/RSV testing.  Fact Sheet for Patients: bloggercourse.com  Fact Sheet for Healthcare Providers: seriousbroker.it  This test is not yet approved or cleared by the [place]  FDA and has been authorized for detection and/or diagnosis of SARS-CoV-2 by FDA under an Emergency Use Authorization (EUA). This EUA will remain in effect (meaning this test can be used) for the duration of the COVID-19 declaration under Section 564(b)(1) of the Act, 21 U.S.C. section 360bbb-3(b)(1), unless the authorization is terminated or revoked.     Resp Syncytial Virus by PCR NEGATIVE NEGATIVE Final    Comment: (NOTE) Fact Sheet for Patients: bloggercourse.com  Fact Sheet for Healthcare Providers: seriousbroker.it  This test is not yet approved or cleared by the [place]  FDA and has been authorized for detection and/or diagnosis of SARS-CoV-2 by FDA under an Emergency Use Authorization (EUA). This EUA will remain in effect (meaning this test can be used) for the duration of the COVID-19 declaration under Section 564(b)(1) of the Act, 21 U.S.C. section 360bbb-3(b)(1), unless the authorization is terminated or revoked.  Performed at [facility], [address] [place], [place] [address]   Blood culture (routine x 2)     Status: None (Preliminary result)   Collection Time: 09/07/24  5:06 PM   Specimen: BLOOD  Result Value Ref Range Status   Specimen Description BLOOD RIGHT ANTECUBITAL  Final   Special Requests   Final    BOTTLES DRAWN AEROBIC AND ANAEROBIC Blood Culture adequate volume   Culture   Final    NO GROWTH 3 DAYS Performed at [facility], [address]  [place], [place] [address]    Report Status PENDING  Incomplete    Labs: CBC: Recent Labs  Lab 09/07/24 1706 09/07/24 1717 09/08/24 0118 09/09/24 0442 09/10/24 0435  WBC 8.5  --  6.6 6.6 6.3  NEUTROABS 5.0  --   --   --   --   HGB 11.8* 12.6  12.9 11.1* 10.6* 10.9*  HCT 35.6* 37.0  38.0 33.5* 32.2* 33.0*  MCV 99.2  --  99.1 101.6* 101.9*  PLT 326  --  313 335 378   Basic Metabolic Panel: Recent Labs  Lab 09/07/24 1706 09/07/24 1717 09/08/24 0118 09/09/24 0442 09/10/24 0435  NA 140 138  140 137 138 137  K 3.7 3.6  3.6 3.9 3.9 4.0  CL 103 100 100 101 102  CO2 26  --  22 27 27   GLUCOSE 110* 110* 218* 103* 109*  BUN <5* <3* <5* 6 6  CREATININE 0.55 0.50 0.69 0.51 0.49  CALCIUM  8.5*  --  8.1* 8.1* 8.6*   Liver Function Tests: Recent Labs  Lab 09/07/24 1706  AST 30  ALT 18  ALKPHOS 99  BILITOT 0.5  PROT  6.9  ALBUMIN  3.3*   CBG: Recent Labs  Lab 09/09/24 1131 09/09/24 1637 09/09/24 2216 09/10/24 0733 09/10/24 1143  GLUCAP 101* 149* 123* 151* 104*    Discharge time spent: greater than 30 minutes.  Signed: Owen DELENA Lore, MD [facility] 09/10/2024

## 2024-09-10 NOTE — Progress Notes (Signed)
 Temporal artery duplex has been completed.   Results can be found under chart review under CV PROC. 09/10/2024 5:54 PM Cyrus Ramsburg RVT, RDMS

## 2024-09-10 NOTE — Plan of Care (Signed)
  Problem: Coping: Goal: Ability to adjust to condition or change in health will improve Outcome: Progressing   Problem: Fluid Volume: Goal: Ability to maintain a balanced intake and output will improve Outcome: Progressing   Problem: Education: Goal: Ability to describe self-care measures that may prevent or decrease complications (Diabetes Survival Skills Education) will improve Outcome: Progressing Goal: Individualized Educational Video(s) Outcome: Progressing   Problem: Health Behavior/Discharge Planning: Goal: Ability to identify and utilize available resources and services will improve Outcome: Progressing   Problem: Health Behavior/Discharge Planning: Goal: Ability to identify and utilize available resources and services will improve Outcome: Progressing Goal: Ability to manage health-related needs will improve Outcome: Progressing   Problem: Nutritional: Goal: Maintenance of adequate nutrition will improve Outcome: Progressing Goal: Progress toward achieving an optimal weight will improve Outcome: Progressing   Problem: Skin Integrity: Goal: Risk for impaired skin integrity will decrease Outcome: Progressing   Problem: Clinical Measurements: Goal: Ability to maintain clinical measurements within normal limits will improve Outcome: Progressing Goal: Will remain free from infection Outcome: Progressing Goal: Diagnostic test results will improve Outcome: Progressing Goal: Respiratory complications will improve Outcome: Progressing Goal: Cardiovascular complication will be avoided Outcome: Progressing   Problem: Nutrition: Goal: Adequate nutrition will be maintained Outcome: Progressing

## 2024-09-10 NOTE — Telephone Encounter (Signed)
 Pharmacy Patient Advocate Encounter  Insurance verification completed.    The patient is insured through [facility] MEDICAID.     Ran test claim for Eliquis  5mg  and the current 30 day co-pay is $4.   This test claim was processed through [employer]- copay amounts may vary at other pharmacies due to [employer], or as the patient moves through the different stages of their insurance plan.

## 2024-09-11 ENCOUNTER — Inpatient Hospital Stay: Attending: Physician Assistant

## 2024-09-11 ENCOUNTER — Inpatient Hospital Stay: Admitting: Internal Medicine

## 2024-09-11 ENCOUNTER — Telehealth: Payer: Self-pay

## 2024-09-11 VITALS — BP 100/68 | HR 75 | Temp 98.2°F | Resp 17 | Ht 62.0 in | Wt 183.0 lb

## 2024-09-11 DIAGNOSIS — C3411 Malignant neoplasm of upper lobe, right bronchus or lung: Secondary | ICD-10-CM | POA: Diagnosis not present

## 2024-09-11 DIAGNOSIS — Z5189 Encounter for other specified aftercare: Secondary | ICD-10-CM | POA: Diagnosis not present

## 2024-09-11 DIAGNOSIS — Z5111 Encounter for antineoplastic chemotherapy: Secondary | ICD-10-CM | POA: Insufficient documentation

## 2024-09-11 DIAGNOSIS — Z7901 Long term (current) use of anticoagulants: Secondary | ICD-10-CM | POA: Insufficient documentation

## 2024-09-11 DIAGNOSIS — T451X5A Adverse effect of antineoplastic and immunosuppressive drugs, initial encounter: Secondary | ICD-10-CM | POA: Insufficient documentation

## 2024-09-11 DIAGNOSIS — L659 Nonscarring hair loss, unspecified: Secondary | ICD-10-CM | POA: Insufficient documentation

## 2024-09-11 DIAGNOSIS — G620 Drug-induced polyneuropathy: Secondary | ICD-10-CM | POA: Insufficient documentation

## 2024-09-11 DIAGNOSIS — I2699 Other pulmonary embolism without acute cor pulmonale: Secondary | ICD-10-CM | POA: Insufficient documentation

## 2024-09-11 DIAGNOSIS — Z902 Acquired absence of lung [part of]: Secondary | ICD-10-CM | POA: Insufficient documentation

## 2024-09-11 LAB — CBC WITH DIFFERENTIAL (CANCER CENTER ONLY)
Abs Immature Granulocytes: 0.03 K/uL (ref 0.00–0.07)
Basophils Absolute: 0.1 K/uL (ref 0.0–0.1)
Basophils Relative: 1 %
Eosinophils Absolute: 0 K/uL (ref 0.0–0.5)
Eosinophils Relative: 0 %
HCT: 36.4 % (ref 36.0–46.0)
Hemoglobin: 11.9 g/dL — ABNORMAL LOW (ref 12.0–15.0)
Immature Granulocytes: 0 %
Lymphocytes Relative: 39 %
Lymphs Abs: 3.1 K/uL (ref 0.7–4.0)
MCH: 32.8 pg (ref 26.0–34.0)
MCHC: 32.7 g/dL (ref 30.0–36.0)
MCV: 100.3 fL — ABNORMAL HIGH (ref 80.0–100.0)
Monocytes Absolute: 0.6 K/uL (ref 0.1–1.0)
Monocytes Relative: 8 %
Neutro Abs: 4.1 K/uL (ref 1.7–7.7)
Neutrophils Relative %: 52 %
Platelet Count: 443 K/uL — ABNORMAL HIGH (ref 150–400)
RBC: 3.63 MIL/uL — ABNORMAL LOW (ref 3.87–5.11)
RDW: 15.9 % — ABNORMAL HIGH (ref 11.5–15.5)
WBC Count: 8 K/uL (ref 4.0–10.5)
nRBC: 0 % (ref 0.0–0.2)

## 2024-09-11 LAB — CMP (CANCER CENTER ONLY)
ALT: 24 U/L (ref 0–44)
AST: 45 U/L — ABNORMAL HIGH (ref 15–41)
Albumin: 4.1 g/dL (ref 3.5–5.0)
Alkaline Phosphatase: 102 U/L (ref 38–126)
Anion gap: 14 (ref 5–15)
BUN: 6 mg/dL (ref 6–20)
CO2: 23 mmol/L (ref 22–32)
Calcium: 9.2 mg/dL (ref 8.9–10.3)
Chloride: 100 mmol/L (ref 98–111)
Creatinine: 0.48 mg/dL (ref 0.44–1.00)
GFR, Estimated: >60 mL/min (ref >60)
Glucose, Bld: 90 mg/dL (ref 70–99)
Potassium: 4 mmol/L (ref 3.5–5.1)
Sodium: 137 mmol/L (ref 135–145)
Total Bilirubin: 0.4 mg/dL (ref 0.0–1.2)
Total Protein: 7.6 g/dL (ref 6.5–8.1)

## 2024-09-11 MED ORDER — PALONOSETRON HCL INJECTION 0.25 MG/5ML
0.2500 mg | Freq: Once | INTRAVENOUS | Status: AC
  Administered 2024-09-11: 0.25 mg via INTRAVENOUS
  Filled 2024-09-11: qty 5

## 2024-09-11 MED ORDER — DEXAMETHASONE SOD PHOSPHATE PF 10 MG/ML IJ SOLN
10.0000 mg | Freq: Once | INTRAMUSCULAR | Status: AC
  Administered 2024-09-11: 10 mg via INTRAVENOUS

## 2024-09-11 MED ORDER — DIPHENHYDRAMINE HCL 50 MG/ML IJ SOLN
50.0000 mg | Freq: Once | INTRAMUSCULAR | Status: AC
  Administered 2024-09-11: 50 mg via INTRAVENOUS
  Filled 2024-09-11: qty 1

## 2024-09-11 MED ORDER — SODIUM CHLORIDE 0.9 % IV SOLN
175.0000 mg/m2 | Freq: Once | INTRAVENOUS | Status: AC
  Administered 2024-09-11: 324 mg via INTRAVENOUS
  Filled 2024-09-11: qty 54

## 2024-09-11 MED ORDER — FAMOTIDINE IN NACL 20-0.9 MG/50ML-% IV SOLN
20.0000 mg | Freq: Once | INTRAVENOUS | Status: AC
  Administered 2024-09-11: 20 mg via INTRAVENOUS
  Filled 2024-09-11: qty 50

## 2024-09-11 MED ORDER — SODIUM CHLORIDE 0.9 % IV SOLN: INTRAVENOUS | Status: DC

## 2024-09-11 MED ORDER — SODIUM CHLORIDE 0.9 % IV SOLN
605.0000 mg | Freq: Once | INTRAVENOUS | Status: AC
  Administered 2024-09-11: 610 mg via INTRAVENOUS
  Filled 2024-09-11: qty 61

## 2024-09-11 MED ORDER — SODIUM CHLORIDE 0.9 % IV SOLN
150.0000 mg | Freq: Once | INTRAVENOUS | Status: AC
  Administered 2024-09-11: 150 mg via INTRAVENOUS
  Filled 2024-09-11: qty 150

## 2024-09-11 NOTE — Progress Notes (Signed)
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 cell lung cancer, squamous cell carcinoma, diagnosed in July 2025. Status post right upper lobectomy with lymph node sampling in September 2025. Currently undergoing adjuvant systemic chemotherapy with carboplatin  and paclitaxel . Completed one cycle with side effects including alopecia and peripheral neuropathy. - Continue adjuvant chemotherapy with carboplatin  and paclitaxel , starting cycle number two today. - Administered white blood count booster shot today; advised to take Tylenol  and Claritin for aches and fatigue for five days.  Pulmonary embolism, right middle and lower lobe, on anticoagulation Pulmonary embolism in the proximal segment of the right middle and lower lobe. Currently on Eliquis for anticoagulation. Emphasized the importance of adherence to the medication regimen to prevent life-threatening complications. - Continue Eliquis with two tablets in the morning and two in the evening for seven days, then reduce to one tablet in the morning and one in the evening. - Ensure regular intake of Eliquis to prevent further embolic events.  Chemotherapy-induced  alopecia Alopecia secondary to chemotherapy with carboplatin  and paclitaxel .  Chemotherapy-induced peripheral neuropathy Peripheral neuropathy characterized by numbness and tingling in the fingers, likely secondary to chemotherapy. Symptoms were not present prior to the first cycle of chemotherapy.  The patient was advised to call immediately if she has any concerning symptoms in the interval. The patient voices understanding of current disease status and treatment options and is in agreement with the current care plan.  All questions were answered. The patient knows to call the clinic with any problems, questions or concerns. We can certainly see the patient much sooner if necessary. The total time spent in the appointment was 30 minutes including review of chart and various tests results, discussions about plan of care and coordination of care plan .   Disclaimer: This note was dictated with voice recognition software. Similar sounding words can inadvertently be transcribed and may not be corrected upon review.

## 2024-09-11 NOTE — Patient Instructions (Signed)
 [facility] - A DEPT OF [facility]  Discharge Instructions: Thank you for choosing [facility] to provide your oncology and hematology care.   If you have a lab appointment with the Cancer Center, please go directly to the Cancer Center and check in at the registration area.   Wear comfortable clothing and clothing appropriate for easy access to any Portacath or PICC line.   We strive to give you quality time with your provider. You may need to reschedule your appointment if you arrive late (15 or more minutes).  Arriving late affects you and other patients whose appointments are after yours.  Also, if you miss three or more appointments without notifying the office, you may be dismissed from the clinic at the provider's discretion.      For prescription refill requests, have your pharmacy contact our office and allow 72 hours for refills to be completed.    Today you received the following chemotherapy and/or immunotherapy agents: Taxol /Carbo.   To help prevent nausea and vomiting after your treatment, we encourage you to take your nausea medication as directed.  BELOW ARE SYMPTOMS THAT SHOULD BE REPORTED IMMEDIATELY: *FEVER GREATER THAN 100.4 F (38 C) OR HIGHER *CHILLS OR SWEATING *NAUSEA AND VOMITING THAT IS NOT CONTROLLED WITH YOUR NAUSEA MEDICATION *UNUSUAL SHORTNESS OF BREATH *UNUSUAL BRUISING OR BLEEDING *URINARY PROBLEMS (pain or burning when urinating, or frequent urination) *BOWEL PROBLEMS (unusual diarrhea, constipation, pain near the anus) TENDERNESS IN MOUTH AND THROAT WITH OR WITHOUT PRESENCE OF ULCERS (sore throat, sores in mouth, or a toothache) UNUSUAL RASH, SWELLING OR PAIN  UNUSUAL VAGINAL DISCHARGE OR ITCHING   Items with * indicate a potential emergency and should be followed up as soon as possible or go to the Emergency Department if any problems should occur.  Please show the CHEMOTHERAPY ALERT CARD or IMMUNOTHERAPY  ALERT CARD at check-in to the Emergency Department and triage nurse.  Should you have questions after your visit or need to cancel or reschedule your appointment, please contact [facility] - A DEPT OF [facility]  Dept: [phone]  and follow the prompts.  Office hours are 8:00 a.m. to 4:30 p.m. Monday - Friday. Please note that voicemails left after 4:00 p.m. may not be returned until the following business day.  We are closed weekends and major holidays. You have access to a nurse at all times for urgent questions. Please call the main number to the clinic Dept: [phone] and follow the prompts.   For any non-urgent questions, you may also contact your provider using MyChart. We now offer e-Visits for anyone 44 and older to request care online for non-urgent symptoms. For details visit [email or web address].   Also download the MyChart app! Go to the app store, search MyChart, open the app, select [place], and log in with your MyChart username and password.

## 2024-09-11 NOTE — Transitions of Care (Post Inpatient/ED Visit) (Signed)
   09/11/2024  Name: Peggy Warren MRN: [id number] DOB: 13-Mar-1966  Today's TOC FU Call Status: Today's TOC FU Call Status:: Unsuccessful Call (1st Attempt) Unsuccessful Call (1st Attempt) Date: 09/11/24  Attempted to reach the patient regarding the most recent Inpatient/ED visit.  Follow Up Plan: Additional outreach attempts will be made to reach the patient to complete the Transitions of Care (Post Inpatient/ED visit) call.   Alan Ee, RN, BSN, CEN [employer]- Transition of Care Team.  [facility] [phone]

## 2024-09-12 ENCOUNTER — Encounter: Payer: Self-pay | Admitting: Internal Medicine

## 2024-09-12 ENCOUNTER — Telehealth: Payer: Self-pay | Admitting: *Deleted

## 2024-09-12 LAB — CULTURE, BLOOD (ROUTINE X 2)
Culture: NO GROWTH
Culture: NO GROWTH
Special Requests: ADEQUATE
Special Requests: ADEQUATE

## 2024-09-12 NOTE — Transitions of Care (Post Inpatient/ED Visit) (Signed)
 09/12/2024  Name: Peggy Warren MRN: [id number] DOB: May 25, 1966  Today's TOC FU Call Status: Today's TOC FU Call Status:: Successful TOC FU Call Completed TOC FU Call Complete Date: 09/12/24  Patient's Name and Date of Birth confirmed. Name, DOB  Transition Care Management Follow-up Telephone Call Date of Discharge: 09/10/24 Discharge Facility: [facility]) Type of Discharge: Inpatient Admission Primary Inpatient Discharge Diagnosis:: Acute pulmonary embolism in setting of recent (R) upper lung lobectomy: September 2025 How have you been since you were released from the hospital?: Better (I am doing fine, not having any problems; feel much better.  Thanks for letting me know I need refills ordered for the Eliquis they started- I will ask Dr. Norleen about that when I see him next week, glad you were able to get the appointment scheduled) Any questions or concerns?: No  Items Reviewed: Did you receive and understand the discharge instructions provided?: Yes (thoroughly reviewed with patient who verbalizes good understanding of same) Medications obtained,verified, and reconciled?: Partial Review Completed (Partial medication review completed; confirmed patient obtained/ is taking all newly Rx'd medications as instructed; self-manages medications and denies questions/ concerns around medications today) Reason for Partial Mediation Review: Patient is not at home and not near medication list Any new allergies since your discharge?: No Dietary orders reviewed?: Yes Type of Diet Ordered:: Regular food, as healthy as I can Do you have support at home?: Yes People in Home [RPT]: friend(s) Name of Support/Comfort Primary Source: Reports independent in self-care activities; resides with roommate; supportive local daughter Missy very involved in patient's life/ care: assists as/ if needed/ indicated  Medications Reviewed Today: Medications Reviewed Today     Reviewed by Nettie Wyffels  M, RN (Registered Nurse) on 09/12/24 at 1044  Med List Status: <None>   Medication Order Taking? Sig Documenting Provider Last Dose Status Informant  acetaminophen  (TYLENOL ) 500 MG tablet [id number]  Take 500-1,000 mg by mouth every 6 (six) hours as needed (pain.). [provider]  Active Self, Pharmacy Records  albuterol  (VENTOLIN  HFA) [phone] Base) MCG/ACT inhaler [id number] Yes Inhale 1-2 puffs into the lungs every 6 (six) hours as needed for wheezing or shortness of breath. [provider]  Active Self, Pharmacy Records  amLODipine  (NORVASC ) 10 MG tablet [id number]  Take 1 tablet (10 mg total) by mouth daily. [name], MD  Active Self, Pharmacy Records  APIXABAN WINN) VTE STARTER PACK (10MG  AND 5MG ) [id number] Yes Take 2 tablets (10 mg total) by mouth 2 (two) times daily for 7 days, THEN 1 tablet (5 mg total) 2 (two) times daily for 23 days. Fausto Burnard DELENA, DO  Active   atorvastatin  (LIPITOR) 40 MG tablet [id number]  Take 1 tablet (40 mg total) by mouth daily. [name], MD  Active Self, Pharmacy Records  calcium  carbonate (TUMS - DOSED IN MG ELEMENTAL CALCIUM ) 500 MG chewable tablet [id number]  Chew 2 tablets by mouth as needed for indigestion or heartburn. [provider]  Active Self, Pharmacy Records  Continuous Glucose Receiver (FREESTYLE LIBRE 3 READER) DEVI [id number]  Use to monitor your blood glucose Kathrin, Mignon DASEN, MD  Active   Continuous Glucose Sensor (FREESTYLE LIBRE 3 PLUS SENSOR) MISC [id number]  Change sensor every 15 days. Gonfa, Taye T, MD  Active   dextromethorphan -guaiFENesin  (MUCINEX  DM) 30-600 MG 12hr tablet [id number] Yes Take 1 tablet by mouth 2 (two) times daily. Fausto Burnard A, DO  Active   insulin  aspart (NOVOLOG  FLEXPEN) 100 UNIT/ML FlexPen [id number] Yes  Inject 13 Units into the skin 3 (three) times daily before meals. Gonfa, Taye T, MD  Active   insulin  glargine (LANTUS ) 100 UNIT/ML Solostar Pen [id number] Yes Inject 35 Units into the  skin 2 (two) times daily. Gonfa, Taye T, MD  Active   Insulin  Pen Needle 32G X 4 MM MISC [id number]  Use 5 (five) times daily. Gonfa, Taye T, MD  Active   lisinopril  (ZESTRIL ) 5 MG tablet [id number]  Take 1 tablet (5 mg total) by mouth daily. Roddenberry, Myron G, PA-C  Active Self, Pharmacy Records  metFORMIN  (GLUCOPHAGE ) 500 MG tablet [id number]  Take 1 tablet (500 mg total) by mouth 2 (two) times daily with a meal for 14 days, THEN 2 tablets (1,000 mg total) 2 (two) times daily with a meal. Kathrin Mignon DASEN, MD  Active   methocarbamol  (ROBAXIN ) 500 MG tablet [id number]  Take 1 tablet (500 mg total) by mouth every 8 (eight) hours as needed for muscle spasms. [name], MD  Active Self, Pharmacy Records  Multiple Vitamin (MULTIVITAMIN) tablet [id number]  Take 1 tablet by mouth as needed. [provider]  Active Self, Pharmacy Records  ondansetron  (ZOFRAN ) 8 MG tablet [id number]  Take 1 tablet (8 mg total) by mouth every 8 (eight) hours as needed for nausea or vomiting. Start on the third day after carboplatin . Sherrod Sherrod, MD  Active Self, Pharmacy Records  oxyCODONE  (OXY IR/ROXICODONE ) 5 MG immediate release tablet [id number] Yes Take 1 tablet (5 mg total) by mouth every 4 (four) hours as needed for moderate pain (pain score 4-6). Fausto Sor A, DO  Active   polyethylene glycol powder (GLYCOLAX/MIRALAX) 17 GM/SCOOP powder [id number] Yes Take 17 g by mouth daily. Dissolve 1 capful (17g) in 4-8 ounces of liquid and take by mouth daily. Regalado, Belkys A, MD  Active   prochlorperazine  (COMPAZINE ) 10 MG tablet [id number]  Take 1 tablet (10 mg total) by mouth every 6 (six) hours as needed. Heilingoetter, Cassandra L, PA-C  Active Self, Pharmacy Records  umeclidinium-vilanterol (ANORO ELLIPTA ) 62.5-25 MCG/ACT AEPB [id number] Yes Inhale 1 puff into the lungs daily. Isadora Hose, MD  Active Self, Pharmacy Records  varenicline  (CHANTIX  CONTINUING MONTH PAK) 1 MG tablet [id number]  Take 1 tablet (1 mg  total) by mouth 2 (two) times daily.  Patient taking differently: Take 1 tablet (1 mg total) by mouth 2 (two) times daily.   [name], MD  Active Self, Pharmacy Records           Med Note JACKOLYN, WADDELL DEL   Fri Aug 10, 2024  4:45 PM)             Home Care and Equipment/Supplies: Were Home Health Services Ordered?: No Any new equipment or medical supplies ordered?: No  Functional Questionnaire: Do you need assistance with bathing/showering or dressing?: No (daughter supervises/ assists as indicated) Do you need assistance with meal preparation?: No Do you need assistance with eating?: No Do you have difficulty maintaining continence: No Do you need assistance with getting out of bed/getting out of a chair/moving?: No Do you have difficulty managing or taking your medications?: No  Follow up appointments reviewed: PCP Follow-up appointment confirmed?: Yes (care coordination outreach in real-time with scheduling care guide to successfully schedule hospital follow up PCP appointment 09/20/24) Date of PCP follow-up appointment?: 09/20/24 Follow-up Provider: PCP- Dr. Norleen Driscilla Salvage Follow-up appointment confirmed?: Yes Date of Specialist follow-up appointment?: 09/11/24 Follow-Up Specialty Provider:: oncology provider with chemotherapy: confirmed attended as scheduled Do  you need transportation to your follow-up appointment?: No Do you understand care options if your condition(s) worsen?: Yes-patient verbalized understanding  SDOH Interventions Today    Flowsheet Row Most Recent Value  SDOH Interventions   Food Insecurity Interventions Intervention Not Indicated  Housing Interventions Intervention Not Indicated  Transportation Interventions Intervention Not Indicated  [patient confirms continues to use established medicaid transportation when her daughter is not available to provide transportation: daughter continues to provide most of transportation per patient  report]  Utilities Interventions Intervention Not Indicated   See TOC assessment tabs for additional assessment/ TOC intervention information  Patient declines need for enrollment in 30-day TOC program- she is active with [facility] RN CM with next scheduled visit 09/28/24- that is enough, I am doing so good I don't really think I need a call every single week; declines taking my direct phone number should needs/ concerns arise post-TOC call   Pls call/ message for questions,  Kayron Kalmar Mckinney Rayhan Groleau, RN, BSN, CCRN Alumnus RN Care Manager  Transitions of Care  [facility] - [facility] [phone]: direct office

## 2024-09-13 ENCOUNTER — Telehealth: Payer: Self-pay | Admitting: *Deleted

## 2024-09-13 ENCOUNTER — Inpatient Hospital Stay

## 2024-09-13 VITALS — BP 141/74 | HR 93 | Temp 98.3°F | Resp 19

## 2024-09-13 DIAGNOSIS — C3411 Malignant neoplasm of upper lobe, right bronchus or lung: Secondary | ICD-10-CM

## 2024-09-13 DIAGNOSIS — Z5111 Encounter for antineoplastic chemotherapy: Secondary | ICD-10-CM | POA: Diagnosis not present

## 2024-09-13 MED ORDER — PEGFILGRASTIM-CBQV 6 MG/0.6ML ~~LOC~~ SOSY
6.0000 mg | PREFILLED_SYRINGE | Freq: Once | SUBCUTANEOUS | Status: AC
  Administered 2024-09-13: 6 mg via SUBCUTANEOUS
  Filled 2024-09-13: qty 0.6

## 2024-09-13 NOTE — Telephone Encounter (Signed)
 08/13/2024: Peggy Warren and daughter [name] in to see this nurse with [name]'s FMLA paperwork.  Completed FMLA paperwork sent to provider to review, make any needed amendments, sign and return to form staff.   Signed paperwork returned to this nurse.   Successfully returned via fax..   No request for medical records.  Copy to [facility] HIM bin designated for items to be scanned to EMR.   Prepared paperwork for pick-up.   Florence [name] for patient notification to pick up before today's injection appointment. Process completed with no further instructions received, actions performed or required by this nurse.

## 2024-09-17 DIAGNOSIS — E119 Type 2 diabetes mellitus without complications: Secondary | ICD-10-CM | POA: Diagnosis not present

## 2024-09-20 ENCOUNTER — Ambulatory Visit: Admitting: Internal Medicine

## 2024-09-20 ENCOUNTER — Encounter: Payer: Self-pay | Admitting: Internal Medicine

## 2024-09-20 VITALS — BP 132/78 | HR 73 | Temp 98.4°F | Ht 62.0 in | Wt 181.0 lb

## 2024-09-20 DIAGNOSIS — Z86711 Personal history of pulmonary embolism: Secondary | ICD-10-CM | POA: Insufficient documentation

## 2024-09-20 DIAGNOSIS — J438 Other emphysema: Secondary | ICD-10-CM | POA: Diagnosis not present

## 2024-09-20 DIAGNOSIS — M25551 Pain in right hip: Secondary | ICD-10-CM

## 2024-09-20 MED ORDER — APIXABAN 5 MG PO TABS: 5.0000 mg | ORAL_TABLET | Freq: Two times a day (BID) | ORAL | 5 refills | Status: AC

## 2024-09-20 MED ORDER — LISINOPRIL 5 MG PO TABS: 5.0000 mg | ORAL_TABLET | Freq: Every day | ORAL | 3 refills | Status: AC

## 2024-09-20 MED ORDER — ALBUTEROL SULFATE HFA 108 (90 BASE) MCG/ACT IN AERS: 1.0000 | INHALATION_SPRAY | Freq: Four times a day (QID) | RESPIRATORY_TRACT | 3 refills | Status: AC | PRN

## 2024-09-20 MED ORDER — FREESTYLE LIBRE 3 PLUS SENSOR MISC: 3 refills | Status: AC

## 2024-09-20 MED ORDER — METHOCARBAMOL 500 MG PO TABS: 500.0000 mg | ORAL_TABLET | Freq: Three times a day (TID) | ORAL | 0 refills | Status: AC | PRN

## 2024-09-20 MED ORDER — ATORVASTATIN CALCIUM 40 MG PO TABS: 40.0000 mg | ORAL_TABLET | Freq: Every day | ORAL | 3 refills | Status: AC

## 2024-09-20 MED ORDER — AMLODIPINE BESYLATE 10 MG PO TABS: 10.0000 mg | ORAL_TABLET | Freq: Every day | ORAL | 3 refills | Status: AC

## 2024-09-20 NOTE — Patient Instructions (Signed)
 You are given the letter for the apartment today  Please continue all other medications as before, and refills have been done including the Eliquis  and the sensors - 3 plus  Please have the pharmacy call with any other refills you may need.  Please continue your efforts at being more active, low cholesterol diet, and weight control.  Please keep your appointments with your specialists as you may have planned - oncology and neurology  No further lab work needed today

## 2024-09-20 NOTE — Assessment & Plan Note (Signed)
 Gradually worsening, pt asks for letter to live on first floor only apartment so would not have to go up stairs

## 2024-09-20 NOTE — Assessment & Plan Note (Signed)
O/w stable, cont current med tx
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
## 2024-09-20 NOTE — Assessment & Plan Note (Signed)
 Currently stable, pt to continue eliquis  and daughter prefers to ask hematology oncology at next appt dec 23 regarding length of tx of eliquis  - ? 6 mo or permanent

## 2024-09-24 DIAGNOSIS — E119 Type 2 diabetes mellitus without complications: Secondary | ICD-10-CM | POA: Diagnosis not present

## 2024-09-24 DIAGNOSIS — C3490 Malignant neoplasm of unspecified part of unspecified bronchus or lung: Secondary | ICD-10-CM | POA: Diagnosis not present

## 2024-09-27 NOTE — Progress Notes (Unsigned)
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 undergoing cycle number 4.  ***PN  ***Want us  to give time frame for eliquis .   The patient was advised to call immediately if she has any concerning symptoms in the interval. The patient voices understanding of current disease status and treatment options and is in agreement with the current care plan. All questions were answered. The patient knows to call the clinic with any problems, questions or concerns. We can certainly see the patient much sooner if necessary   No orders of the defined types were placed in this encounter.    I spent {CHL ONC TIME VISIT - DTPQU:8845999869} counseling the patient face to face. The total time spent in the appointment was {CHL ONC TIME VISIT - DTPQU:8845999869}.  Amayia Ciano L Adrienne Delay, PA-C 09/27/2024

## 2024-09-28 ENCOUNTER — Telehealth

## 2024-09-28 NOTE — Patient Instructions (Signed)
 Visit Information  Ms. Hodgens was given information about Medicaid Managed Care team care coordination services as a part of their [employer] Medicaid benefit.   If you would like to schedule transportation through your [facility] plan, please call the following number at least 2 days in advance of your appointment: [phone]  If you are experiencing a behavioral health crisis, call the [employer] [place]  Behavioral Health Crisis Line at [phone]). The line is available 24 hours a day, seven days a week.   Please see education materials related to diabetes provided by MyChart link.  Patient verbalizes understanding of instructions and care plan provided today and agrees to view in MyChart. Active MyChart status and patient understanding of how to access instructions and care plan via MyChart confirmed with patient.     Telephone follow up appointment with Managed Medicaid care management team member scheduled for:10/26/24 at 11:30 am.  Heddy Shutter, RN, MSN, BSN, CCM [place]  [facility], Population Health Case Manager Phone: [phone]   Following is a copy of your plan of care:  Goals Addressed             This Visit's Progress    VBCI RN Care Plan       Problems:  Chronic Disease Management support and education needs related to DMII Admit 09/07/24 with acute PE-patient denies any questions or concerns. Discussed importance of taking Eliquis  as prescribed.  Upcoming appointments: 12/23 oncology;  10/17/24 transfer new PCP 10/18/24 orthopedic 10/23/24 oncology  Goal: Over the next 90 days the Patient will continue to work with RN Care Manager and/or Social Worker to address care management and care coordination needs related to DMII as evidenced by adherence to care management team scheduled appointments     demonstrate Improved adherence to prescribed treatment plan for DMII as  evidenced by patient report and/or review of chart take all medications exactly as prescribed and will call provider for medication related questions as evidenced by patient report and/or review of chart    verbalize understanding of plan for management of DMII as evidenced by patient report and/or review of chart  Interventions:  Diabetes Interventions: Assessed patient's understanding of A1c goal: <8% Reviewed medications with patient and discussed importance of medication adherence Assessed for any signs/symptoms of hypoglycemia or hyperglycemia Reiterated Portion control during the holidays and encouraged minimize concentrated sweets/simple carbohydrates and monitor carbohydrate intake. RNCM advised patient to continue to check and record BS Diabetes education provided re: Diabetes Lab Results  Component Value Date   HGBA1C 11.9 (H) 08/13/2024   Patient Self-Care Activities:  Attend all scheduled provider appointments Call provider office for new concerns or questions  schedule appointment with eye doctor Contact provider with BS consistently low < 70 or consistently high>200  Plan:  Telephone follow up appointment with care management team member scheduled for:  10/26/24 at 11:30 am
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
## 2024-10-01 ENCOUNTER — Inpatient Hospital Stay

## 2024-10-01 ENCOUNTER — Inpatient Hospital Stay: Admitting: Physician Assistant

## 2024-10-01 MED FILL — Fosaprepitant Dimeglumine For IV Infusion 150 MG (Base Eq): INTRAVENOUS | Qty: 5 | Status: AC

## 2024-10-02 ENCOUNTER — Inpatient Hospital Stay

## 2024-10-02 ENCOUNTER — Inpatient Hospital Stay (HOSPITAL_BASED_OUTPATIENT_CLINIC_OR_DEPARTMENT_OTHER): Admitting: Physician Assistant

## 2024-10-02 VITALS — BP 108/68 | HR 77 | Temp 98.0°F | Resp 16 | Wt 184.0 lb

## 2024-10-02 DIAGNOSIS — C3411 Malignant neoplasm of upper lobe, right bronchus or lung: Secondary | ICD-10-CM | POA: Diagnosis not present

## 2024-10-02 DIAGNOSIS — G6289 Other specified polyneuropathies: Secondary | ICD-10-CM

## 2024-10-02 DIAGNOSIS — Z5111 Encounter for antineoplastic chemotherapy: Secondary | ICD-10-CM

## 2024-10-02 LAB — CMP (CANCER CENTER ONLY)
ALT: 19 U/L (ref 0–44)
AST: 36 U/L (ref 15–41)
Albumin: 4.4 g/dL (ref 3.5–5.0)
Alkaline Phosphatase: 91 U/L (ref 38–126)
Anion gap: 13 (ref 5–15)
BUN: <5 mg/dL — ABNORMAL LOW (ref 6–20)
CO2: 22 mmol/L (ref 22–32)
Calcium: 9 mg/dL (ref 8.9–10.3)
Chloride: 103 mmol/L (ref 98–111)
Creatinine: 0.53 mg/dL (ref 0.44–1.00)
GFR, Estimated: >60 mL/min
Glucose, Bld: 106 mg/dL — ABNORMAL HIGH (ref 70–99)
Potassium: 4 mmol/L (ref 3.5–5.1)
Sodium: 138 mmol/L (ref 135–145)
Total Bilirubin: 0.3 mg/dL (ref 0.0–1.2)
Total Protein: 7.8 g/dL (ref 6.5–8.1)

## 2024-10-02 LAB — CBC WITH DIFFERENTIAL (CANCER CENTER ONLY)
Abs Immature Granulocytes: 0.01 K/uL (ref 0.00–0.07)
Basophils Absolute: 0.1 K/uL (ref 0.0–0.1)
Basophils Relative: 3 %
Eosinophils Absolute: 0.1 K/uL (ref 0.0–0.5)
Eosinophils Relative: 2 %
HCT: 37.4 % (ref 36.0–46.0)
Hemoglobin: 12.8 g/dL (ref 12.0–15.0)
Immature Granulocytes: 0 %
Lymphocytes Relative: 50 %
Lymphs Abs: 2.4 K/uL (ref 0.7–4.0)
MCH: 34 pg (ref 26.0–34.0)
MCHC: 34.2 g/dL (ref 30.0–36.0)
MCV: 99.2 fL (ref 80.0–100.0)
Monocytes Absolute: 0.4 K/uL (ref 0.1–1.0)
Monocytes Relative: 9 %
Neutro Abs: 1.7 K/uL (ref 1.7–7.7)
Neutrophils Relative %: 36 %
Platelet Count: 312 K/uL (ref 150–400)
RBC: 3.77 MIL/uL — ABNORMAL LOW (ref 3.87–5.11)
RDW: 15.5 % (ref 11.5–15.5)
WBC Count: 4.7 K/uL (ref 4.0–10.5)
nRBC: 0 % (ref 0.0–0.2)

## 2024-10-02 MED ORDER — SODIUM CHLORIDE 0.9 % IV SOLN
150.0000 mg/m2 | Freq: Once | INTRAVENOUS | Status: AC
  Administered 2024-10-02: 282 mg via INTRAVENOUS
  Filled 2024-10-02: qty 47

## 2024-10-02 MED ORDER — GABAPENTIN 100 MG PO CAPS: 100.0000 mg | ORAL_CAPSULE | Freq: Two times a day (BID) | ORAL | 2 refills | Status: AC

## 2024-10-02 MED ORDER — DIPHENHYDRAMINE HCL 50 MG/ML IJ SOLN
50.0000 mg | Freq: Once | INTRAMUSCULAR | Status: AC
  Administered 2024-10-02: 50 mg via INTRAVENOUS
  Filled 2024-10-02: qty 1

## 2024-10-02 MED ORDER — SODIUM CHLORIDE 0.9 % IV SOLN
605.0000 mg | Freq: Once | INTRAVENOUS | Status: AC
  Administered 2024-10-02: 610 mg via INTRAVENOUS
  Filled 2024-10-02: qty 61

## 2024-10-02 MED ORDER — DEXAMETHASONE SOD PHOSPHATE PF 10 MG/ML IJ SOLN
10.0000 mg | Freq: Once | INTRAMUSCULAR | Status: AC
  Administered 2024-10-02: 10 mg via INTRAVENOUS

## 2024-10-02 MED ORDER — SODIUM CHLORIDE 0.9 % IV SOLN
150.0000 mg | Freq: Once | INTRAVENOUS | Status: AC
  Administered 2024-10-02: 150 mg via INTRAVENOUS
  Filled 2024-10-02: qty 150

## 2024-10-02 MED ORDER — PALONOSETRON HCL INJECTION 0.25 MG/5ML
0.2500 mg | Freq: Once | INTRAVENOUS | Status: AC
  Administered 2024-10-02: 0.25 mg via INTRAVENOUS
  Filled 2024-10-02: qty 5

## 2024-10-02 MED ORDER — FAMOTIDINE IN NACL 20-0.9 MG/50ML-% IV SOLN
20.0000 mg | Freq: Once | INTRAVENOUS | Status: AC
  Administered 2024-10-02: 20 mg via INTRAVENOUS
  Filled 2024-10-02: qty 50

## 2024-10-02 MED ORDER — SODIUM CHLORIDE 0.9 % IV SOLN: INTRAVENOUS | Status: DC

## 2024-10-02 NOTE — Patient Instructions (Signed)
 [facility] - A DEPT OF [place]  Discharge Instructions: Thank you for choosing [facility] to provide your oncology and hematology care.   If you have a lab appointment with the Cancer Center, please go directly to the Cancer Center and check in at the registration area.   Wear comfortable clothing and clothing appropriate for easy access to any Portacath or PICC line.   We strive to give you quality time with your provider. You may need to reschedule your appointment if you arrive late (15 or more minutes).  Arriving late affects you and other patients whose appointments are after yours.  Also, if you miss three or more appointments without notifying the office, you may be dismissed from the clinic at the providers discretion.      For prescription refill requests, have your pharmacy contact our office and allow 72 hours for refills to be completed.    Today you received the following chemotherapy and/or immunotherapy agents:  CARBOplatin (PARAPLATIN)  PACLitaxel (TAXOL)    To help prevent nausea and vomiting after your treatment, we encourage you to take your nausea medication as directed.  BELOW ARE SYMPTOMS THAT SHOULD BE REPORTED IMMEDIATELY: *FEVER GREATER THAN 100.4 F (38 C) OR HIGHER *CHILLS OR SWEATING *NAUSEA AND VOMITING THAT IS NOT CONTROLLED WITH YOUR NAUSEA MEDICATION *UNUSUAL SHORTNESS OF BREATH *UNUSUAL BRUISING OR BLEEDING *URINARY PROBLEMS (pain or burning when urinating, or frequent urination) *BOWEL PROBLEMS (unusual diarrhea, constipation, pain near the anus) TENDERNESS IN MOUTH AND THROAT WITH OR WITHOUT PRESENCE OF ULCERS (sore throat, sores in mouth, or a toothache) UNUSUAL RASH, SWELLING OR PAIN  UNUSUAL VAGINAL DISCHARGE OR ITCHING   Items with * indicate a potential emergency and should be followed up as soon as possible or go to the Emergency Department if any problems should occur.  Please show the  CHEMOTHERAPY ALERT CARD or IMMUNOTHERAPY ALERT CARD at check-in to the Emergency Department and triage nurse.  Should you have questions after your visit or need to cancel or reschedule your appointment, please contact [facility]  Dept: [phone]  and follow the prompts.  Office hours are 8:00 a.m. to 4:30 p.m. Monday - Friday. Please note that voicemails left after 4:00 p.m. may not be returned until the following business day.  We are closed weekends and major holidays. You have access to a nurse at all times for urgent questions. Please call the main number to the clinic Dept: [phone] and follow the prompts.   For any non-urgent questions, you may also contact your provider using MyChart. We now offer e-Visits for anyone 41 and older to request care online for non-urgent symptoms. For details visit [email or web address].   Also download the MyChart app! Go to the app store, search MyChart, open the app, select [place], and log in with your MyChart username and password.

## 2024-10-03 ENCOUNTER — Inpatient Hospital Stay

## 2024-10-03 ENCOUNTER — Inpatient Hospital Stay: Admitting: Physician Assistant

## 2024-10-05 ENCOUNTER — Inpatient Hospital Stay

## 2024-10-05 VITALS — BP 116/66 | HR 73 | Temp 98.3°F | Resp 18

## 2024-10-05 DIAGNOSIS — Z5111 Encounter for antineoplastic chemotherapy: Secondary | ICD-10-CM | POA: Diagnosis not present

## 2024-10-05 DIAGNOSIS — C3411 Malignant neoplasm of upper lobe, right bronchus or lung: Secondary | ICD-10-CM

## 2024-10-05 MED ORDER — PEGFILGRASTIM-CBQV 6 MG/0.6ML ~~LOC~~ SOSY
6.0000 mg | PREFILLED_SYRINGE | Freq: Once | SUBCUTANEOUS | Status: AC
  Administered 2024-10-05: 6 mg via SUBCUTANEOUS
  Filled 2024-10-05: qty 0.6

## 2024-10-08 ENCOUNTER — Inpatient Hospital Stay: Admitting: Licensed Clinical Social Worker

## 2024-10-08 ENCOUNTER — Encounter: Payer: Self-pay | Admitting: Internal Medicine

## 2024-10-08 DIAGNOSIS — C3411 Malignant neoplasm of upper lobe, right bronchus or lung: Secondary | ICD-10-CM

## 2024-10-08 NOTE — Progress Notes (Signed)
 Received call from [name] pt's daughter with patient present after receiving my information form child psychotherapist. My card should have been given at November appointment per my note on the [facility] appointment.  Introduced myself as Dance Movement Psychotherapist and to discuss details of [employer] she was asking about. Advised what is needed to apply and how to submit. She verbalized understanding. Advised of next steps and how to complete as well. Briefly discussed covered expenses and how they are submitted.  She has my name, direct contact number and email address for any additional questions or concerns.

## 2024-10-08 NOTE — Progress Notes (Signed)
 [facility] CSW Progress Note  Clinical Child Psychotherapist contacted patient by phone to follow-up on financial concerns.    Interventions: CSW spoke w/ pt and her daughter regarding financial concerns.  CSW informed pt's daughter of the M[employer]ret[employer] which pt has not yet applied for.  Pt meets presumptive eligibility as she does receive SNAP benefits.  Pt's daughter provided w/ contact details for Kristie Georgi to start application process.  CSW to submit a referral to [employer] on behalf of pt mid January.        Follow Up Plan:  Patient will contact CSW with any support or resource needs    Peggy JONELLE Manna, LCSW Clinical Social Worker [facility]    Patient is participating in a Managed Medicaid Plan:  Yes

## 2024-10-10 ENCOUNTER — Ambulatory Visit (INDEPENDENT_AMBULATORY_CARE_PROVIDER_SITE_OTHER)

## 2024-10-10 ENCOUNTER — Ambulatory Visit (HOSPITAL_COMMUNITY)
Admission: EM | Admit: 2024-10-10 | Discharge: 2024-10-10 | Disposition: A | Discharge status: Home / Self Care | Source: Home / Self Care

## 2024-10-10 ENCOUNTER — Encounter (HOSPITAL_COMMUNITY): Payer: Self-pay | Admitting: *Deleted

## 2024-10-10 DIAGNOSIS — J22 Unspecified acute lower respiratory infection: Secondary | ICD-10-CM

## 2024-10-10 DIAGNOSIS — R051 Acute cough: Secondary | ICD-10-CM

## 2024-10-10 LAB — POC COVID19/FLU A&B COMBO
Covid Antigen, POC: NEGATIVE
Influenza A Antigen, POC: NEGATIVE
Influenza B Antigen, POC: NEGATIVE

## 2024-10-10 MED ORDER — AMOXICILLIN-POT CLAVULANATE 875-125 MG PO TABS: 1.0000 | ORAL_TABLET | Freq: Two times a day (BID) | ORAL | 0 refills | Status: DC

## 2024-10-10 MED ORDER — SILVER SULFADIAZINE 1 % EX CREA
TOPICAL_CREAM | CUTANEOUS | Status: AC
  Filled 2024-10-10: qty 85

## 2024-10-10 MED ORDER — DEXAMETHASONE SOD PHOSPHATE PF 10 MG/ML IJ SOLN
10.0000 mg | Freq: Once | INTRAMUSCULAR | Status: AC
  Administered 2024-10-10: 10 mg via INTRAMUSCULAR
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
## 2024-10-10 NOTE — ED Triage Notes (Signed)
 Pt states she has headache, cough, congestion, headache, eye drainage X 4 days. She has not taken any meds due to her regular meds and chemo.   She states she had lung surgery 06/2024 and is doing chemo now for lung cancer.

## 2024-10-10 NOTE — Discharge Instructions (Signed)
 Flu A, flu B and COVID testing done today is negative.  X-ray of the chest done today.  Final evaluation by the radiologist does not show any acute findings compared to x-ray from November.  Physical exam findings are concerning due to the decreased breath sounds but the vital signs are overall reassuring.  Due to the history of lung cancer, chemotherapy and history of right lobectomy, we will treat with steroid injection as well as antibiotics by mouth.  We recommend the following: Decadron  injection given today. This is a steroid to help with inflammation Augmentin  875 mg twice daily for 7 days.  This is an antibiotic.  Take this with food.   Over-the-counter Mucinex  to help with chest congestion May use Delsym  for coughing.  If you get Mucinex  DM do not use Delsym  May use Tylenol  for fever or pain If you feel your symptoms are worsening then you need to go to the emergency room immediately for further evaluation

## 2024-10-12 ENCOUNTER — Other Ambulatory Visit: Payer: Self-pay | Admitting: Internal Medicine

## 2024-10-12 ENCOUNTER — Telehealth: Payer: Self-pay | Admitting: Internal Medicine

## 2024-10-12 ENCOUNTER — Encounter: Payer: Self-pay | Admitting: Internal Medicine

## 2024-10-12 MED ORDER — OXYCODONE HCL 5 MG PO TABS: 5.0000 mg | ORAL_TABLET | ORAL | 0 refills | Status: AC | PRN

## 2024-10-12 NOTE — Telephone Encounter (Signed)
 Copied from CRM #[id number]. Topic: Clinical - Medication Refill >> Oct 12, 2024  9:01 AM Antwanette L wrote: Medication: oxyCODONE  (OXY IR/ROXICODONE ) 5 MG immediate release tablet  Has the patient contacted their pharmacy? Yes  This is the patient's preferred pharmacy:  [facility] - [place], [place] - [address] [place] [address] Phone: [phone] Fax: [phone]   Is this the correct pharmacy for this prescription? Yes   Has the prescription been filled recently? Yes. Last refill was on 09/09/24  Is the patient out of the medication? No. The patient has 2 pills left  Has the patient been seen for an appointment in the last year OR does the patient have an upcoming appointment? Yes. Last ov with Dr. Lynwood Rush was on 09/20/24 and next appt is toc on 10/17/24   Can we respond through MyChart? Yes and by phone at [phone]  Agent: Please be advised that Rx refills may take up to 3 business days. We ask that you follow-up with your pharmacy.

## 2024-10-12 NOTE — Telephone Encounter (Unsigned)
 Copied from CRM #[id number]. Topic: Clinical - Refused Triage >> Oct 12, 2024  9:11 AM Antwanette L wrote:  Pt reports right leg pain. Pt called to refill her oxyCODONE  (OXY IR/ROXICODONE ) 5 MG immediate release tablet.When offiered the option to speak with a nurse the pt declined

## 2024-10-12 NOTE — Telephone Encounter (Signed)
 This was done erx, thanks

## 2024-10-12 NOTE — Progress Notes (Signed)
 Received documents for [employer] and grant paperwork via docusign with the assistance of her daughter.  Patient approved for one-time $1000 [employer] grant to assist with personal expenses while going through treatment. She received a copy of the approval letter and expense sheet via docusign.  An expense was submitted on her behalf utilizing the grant.  She has my card for any additional financial questions or concerns.

## 2024-10-17 ENCOUNTER — Ambulatory Visit: Admitting: Nurse Practitioner

## 2024-10-17 ENCOUNTER — Encounter: Payer: Self-pay | Admitting: Nurse Practitioner

## 2024-10-17 VITALS — BP 102/60 | HR 72 | Temp 98.0°F | Ht 62.0 in | Wt 183.4 lb

## 2024-10-17 DIAGNOSIS — K219 Gastro-esophageal reflux disease without esophagitis: Secondary | ICD-10-CM

## 2024-10-17 DIAGNOSIS — I10 Essential (primary) hypertension: Secondary | ICD-10-CM | POA: Diagnosis not present

## 2024-10-17 DIAGNOSIS — J438 Other emphysema: Secondary | ICD-10-CM | POA: Diagnosis not present

## 2024-10-17 DIAGNOSIS — G629 Polyneuropathy, unspecified: Secondary | ICD-10-CM | POA: Diagnosis not present

## 2024-10-17 DIAGNOSIS — F32A Depression, unspecified: Secondary | ICD-10-CM | POA: Diagnosis not present

## 2024-10-17 DIAGNOSIS — Z794 Long term (current) use of insulin: Secondary | ICD-10-CM | POA: Diagnosis not present

## 2024-10-17 DIAGNOSIS — C3411 Malignant neoplasm of upper lobe, right bronchus or lung: Secondary | ICD-10-CM

## 2024-10-17 DIAGNOSIS — Z86711 Personal history of pulmonary embolism: Secondary | ICD-10-CM

## 2024-10-17 DIAGNOSIS — G459 Transient cerebral ischemic attack, unspecified: Secondary | ICD-10-CM

## 2024-10-17 DIAGNOSIS — F1020 Alcohol dependence, uncomplicated: Secondary | ICD-10-CM

## 2024-10-17 DIAGNOSIS — E119 Type 2 diabetes mellitus without complications: Secondary | ICD-10-CM

## 2024-10-17 DIAGNOSIS — M1611 Unilateral primary osteoarthritis, right hip: Secondary | ICD-10-CM | POA: Diagnosis not present

## 2024-10-17 DIAGNOSIS — F1411 Cocaine abuse, in remission: Secondary | ICD-10-CM

## 2024-10-17 DIAGNOSIS — F17200 Nicotine dependence, unspecified, uncomplicated: Secondary | ICD-10-CM

## 2024-10-17 DIAGNOSIS — F419 Anxiety disorder, unspecified: Secondary | ICD-10-CM | POA: Diagnosis not present

## 2024-10-17 DIAGNOSIS — Z8673 Personal history of transient ischemic attack (TIA), and cerebral infarction without residual deficits: Secondary | ICD-10-CM

## 2024-10-17 DIAGNOSIS — L989 Disorder of the skin and subcutaneous tissue, unspecified: Secondary | ICD-10-CM | POA: Diagnosis not present

## 2024-10-17 MED ORDER — OMEPRAZOLE 40 MG PO CPDR: 40.0000 mg | DELAYED_RELEASE_CAPSULE | Freq: Every day | ORAL | 0 refills | Status: AC

## 2024-10-17 MED ORDER — UMECLIDINIUM-VILANTEROL 62.5-25 MCG/ACT IN AEPB: 1.0000 | INHALATION_SPRAY | Freq: Every day | RESPIRATORY_TRACT | 12 refills | Status: AC

## 2024-10-17 MED ORDER — ACCU-CHEK FASTCLIX LANCET KIT: 1.0000 | PACK | Freq: Two times a day (BID) | 0 refills | Status: AC

## 2024-10-17 MED ORDER — NICOTINE 7 MG/24HR TD PT24: 7.0000 mg | MEDICATED_PATCH | Freq: Every day | TRANSDERMAL | 0 refills | Status: DC

## 2024-10-17 MED ORDER — TRETINOIN 0.05 % EX CREA: TOPICAL_CREAM | Freq: Every day | CUTANEOUS | 0 refills | Status: AC

## 2024-10-17 NOTE — Assessment & Plan Note (Signed)
 She has stopped for the last 5 months. Congratulated her on this.

## 2024-10-17 NOTE — Assessment & Plan Note (Addendum)
 Chronic, not controlled. Suboptimal control with metformin  causing gastrointestinal side effects when taken without food. She is taking lantus  35 units BID and novolog  13 units with meals. Order needles for blood glucose monitoring. Advise taking metformin  with food. Refer to nutritionist for dietary management. Schedule follow-up in four weeks for A1c and labs. Referral placed to [facility] team.

## 2024-10-17 NOTE — Assessment & Plan Note (Signed)
 Chronic dependence with current smoking of three to four cigarettes per day. Previous cessation attempts with Wellbutrin  and Chantix . Start nicotine  patch 7 mg daily. Advise against smoking while using the nicotine  patch.

## 2024-10-17 NOTE — Assessment & Plan Note (Signed)
 History in 2024. Continue atorvastatin  40mg  daily. Not currently on aspirin  due to PE and taking eliquis .

## 2024-10-17 NOTE — Assessment & Plan Note (Signed)
 Severe osteoarthritis with plans for hip replacement surgery. Continue pain management with Tylenol  and oxycodone  5mg  as needed. Proceed with plans for hip replacement surgery.

## 2024-10-17 NOTE — Assessment & Plan Note (Signed)
 Continue eliquis  5mg  BID.

## 2024-10-17 NOTE — Assessment & Plan Note (Signed)
 Chronic GERD with symptoms not controlled by Tums. Start omeprazole  40mg  once daily.

## 2024-10-17 NOTE — Assessment & Plan Note (Signed)
 Chronic anxiety and depression with worsening symptoms. No current medication. Refer to therapist for counseling and support. Discuss potential for future medication if therapy alone is insufficient.

## 2024-10-17 NOTE — Assessment & Plan Note (Signed)
 Chronic, stable. Managed with anoro ellipta  and albuterol  inhaler as needed. Continue current regimen.

## 2024-10-17 NOTE — Assessment & Plan Note (Signed)
 Chronic, stable. Continue amlodipine  10mg  daily and lisinopril  5mg  daily. Check labs next visit.

## 2024-10-17 NOTE — Assessment & Plan Note (Signed)
 Undergoing chemotherapy with side effects including neuropathy and bumps on her skin. Oncologist adjusted dosage to manage side effects. Continue chemotherapy as per oncologist's plan. Monitor for side effects and adjust treatment as needed.

## 2024-10-17 NOTE — Assessment & Plan Note (Signed)
 Skin reactions possibly related to chemotherapy. Sensitive skin with previous allergic reactions to tape. Apply hydrocortisone cream to affected areas. Use warm compresses for any knots or bumps. Can also try tretinoin  cream on face at bedtime

## 2024-10-17 NOTE — Assessment & Plan Note (Signed)
 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 Gabapentin  not effective. Oncologist adjusted chemotherapy dosage. Continue gabapentin  as prescribed. Follow up with oncologist for potential further adjustments to chemotherapy and gabapentin  regimen.

## 2024-10-17 NOTE — Assessment & Plan Note (Signed)
 Chronic dependence with withdrawal symptoms on reduction. Risks of abrupt cessation include seizures. Recommend gradual reduction by one beer every few days. Advise hospital-based detoxification for abrupt cessation. Provide information on local resources and support groups such as AA. Referral placed to [facility]I team.

## 2024-10-17 NOTE — Patient Instructions (Addendum)
 It was great to see you!  Start omeprazole  once a day for your acid reflux   Take the metformin  with food ideally a meal   I have placed a referral to a nutritionist and therapist - they will call   Through the freestyle libre app join our office: [phone]  Don't just suddenly stop drinking, cut back by 1 drink every few days   Start hydrocortisone cream daily to your arm   You can use warm compresses on your left arm as needed for pain   Start glucerna drink to supplement meals   Start tretinoin  cream, a thin layer at bedtime to affected bumps on your face   Start nicotine  patch daily. Do not smoke with this on  Let's follow-up in 4 weeks, sooner if you have concerns.  If a referral was placed today, you will be contacted for an appointment. Please note that routine referrals can sometimes take up to 3-4 weeks to process. Please call our office if you haven't heard anything after this time frame.  Take care,  Tinnie Harada, NP
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
## 2024-10-18 ENCOUNTER — Other Ambulatory Visit: Payer: Self-pay

## 2024-10-18 ENCOUNTER — Ambulatory Visit (INDEPENDENT_AMBULATORY_CARE_PROVIDER_SITE_OTHER): Admitting: Sports Medicine

## 2024-10-18 ENCOUNTER — Ambulatory Visit (INDEPENDENT_AMBULATORY_CARE_PROVIDER_SITE_OTHER): Admitting: Orthopaedic Surgery

## 2024-10-18 VITALS — Ht 61.5 in | Wt 180.0 lb

## 2024-10-18 DIAGNOSIS — Z794 Long term (current) use of insulin: Secondary | ICD-10-CM

## 2024-10-18 DIAGNOSIS — M1611 Unilateral primary osteoarthritis, right hip: Secondary | ICD-10-CM | POA: Diagnosis not present

## 2024-10-18 DIAGNOSIS — E119 Type 2 diabetes mellitus without complications: Secondary | ICD-10-CM

## 2024-10-18 NOTE — Progress Notes (Signed)
" ° °  Procedure Note  Patient: Peggy Warren             Date of Birth: 18-Apr-1966           MRN: [id number]             Visit Date: 10/18/2024  Procedures: Visit Diagnoses:  1. Primary osteoarthritis of right hip   2. Insulin  dependent type 2 diabetes mellitus (HCC)    Lab Results  Component Value Date   HGBA1C 11.9 (H) 08/13/2024   - No injection performed today - Given patient's high A1c, she currently does not have CGM yet or a plan in place in terms of corrective dose insulin  based on transient rise from CSI - She will reach out to her primary physician with a plan in place in terms of adjusting based on her blood glucose and we will bring her back over the next 1-2 weeks for evaluation and planned injection  Lonell Sprang, DO Primary Care Sports Medicine Physician  [facility]  This note was dictated using Dragon naturally speaking software and may contain errors in syntax, spelling, or content which have not been identified prior to signing this note.     "
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
## 2024-10-22 MED FILL — Fosaprepitant Dimeglumine For IV Infusion 150 MG (Base Eq): INTRAVENOUS | Qty: 5 | Status: AC

## 2024-10-23 ENCOUNTER — Inpatient Hospital Stay: Attending: Physician Assistant

## 2024-10-23 ENCOUNTER — Other Ambulatory Visit (HOSPITAL_COMMUNITY): Payer: Self-pay

## 2024-10-23 ENCOUNTER — Inpatient Hospital Stay: Attending: Physician Assistant | Admitting: Internal Medicine

## 2024-10-23 ENCOUNTER — Encounter: Payer: Self-pay | Admitting: Internal Medicine

## 2024-10-23 ENCOUNTER — Telehealth: Payer: Self-pay

## 2024-10-23 ENCOUNTER — Inpatient Hospital Stay: Admitting: Licensed Clinical Social Worker

## 2024-10-23 ENCOUNTER — Inpatient Hospital Stay

## 2024-10-23 VITALS — BP 161/83 | HR 82 | Resp 18

## 2024-10-23 VITALS — BP 136/77 | HR 84 | Temp 98.1°F | Resp 17 | Ht 61.5 in | Wt 185.0 lb

## 2024-10-23 DIAGNOSIS — Z5189 Encounter for other specified aftercare: Secondary | ICD-10-CM | POA: Diagnosis not present

## 2024-10-23 DIAGNOSIS — Z902 Acquired absence of lung [part of]: Secondary | ICD-10-CM | POA: Diagnosis not present

## 2024-10-23 DIAGNOSIS — Z5111 Encounter for antineoplastic chemotherapy: Secondary | ICD-10-CM | POA: Insufficient documentation

## 2024-10-23 DIAGNOSIS — C3490 Malignant neoplasm of unspecified part of unspecified bronchus or lung: Secondary | ICD-10-CM | POA: Diagnosis not present

## 2024-10-23 DIAGNOSIS — C3411 Malignant neoplasm of upper lobe, right bronchus or lung: Secondary | ICD-10-CM

## 2024-10-23 DIAGNOSIS — I2699 Other pulmonary embolism without acute cor pulmonale: Secondary | ICD-10-CM | POA: Diagnosis not present

## 2024-10-23 DIAGNOSIS — Z7901 Long term (current) use of anticoagulants: Secondary | ICD-10-CM | POA: Insufficient documentation

## 2024-10-23 LAB — CMP (CANCER CENTER ONLY)
ALT: 19 U/L (ref 0–44)
AST: 39 U/L (ref 15–41)
Albumin: 4.4 g/dL (ref 3.5–5.0)
Alkaline Phosphatase: 103 U/L (ref 38–126)
Anion gap: 16 — ABNORMAL HIGH (ref 5–15)
BUN: <5 mg/dL — ABNORMAL LOW (ref 6–20)
CO2: 22 mmol/L (ref 22–32)
Calcium: 8.9 mg/dL (ref 8.9–10.3)
Chloride: 97 mmol/L — ABNORMAL LOW (ref 98–111)
Creatinine: 0.49 mg/dL (ref 0.44–1.00)
GFR, Estimated: >60 mL/min
Glucose, Bld: 86 mg/dL (ref 70–99)
Potassium: 4 mmol/L (ref 3.5–5.1)
Sodium: 135 mmol/L (ref 135–145)
Total Bilirubin: 0.3 mg/dL (ref 0.0–1.2)
Total Protein: 7.9 g/dL (ref 6.5–8.1)

## 2024-10-23 LAB — CBC WITH DIFFERENTIAL (CANCER CENTER ONLY)
Abs Immature Granulocytes: 0.01 K/uL (ref 0.00–0.07)
Basophils Absolute: 0.1 K/uL (ref 0.0–0.1)
Basophils Relative: 1 %
Eosinophils Absolute: 0 K/uL (ref 0.0–0.5)
Eosinophils Relative: 0 %
HCT: 34.8 % — ABNORMAL LOW (ref 36.0–46.0)
Hemoglobin: 12.3 g/dL (ref 12.0–15.0)
Immature Granulocytes: 0 %
Lymphocytes Relative: 37 %
Lymphs Abs: 2.6 K/uL (ref 0.7–4.0)
MCH: 34.8 pg — ABNORMAL HIGH (ref 26.0–34.0)
MCHC: 35.3 g/dL (ref 30.0–36.0)
MCV: 98.6 fL (ref 80.0–100.0)
Monocytes Absolute: 0.6 K/uL (ref 0.1–1.0)
Monocytes Relative: 9 %
Neutro Abs: 3.7 K/uL (ref 1.7–7.7)
Neutrophils Relative %: 53 %
Platelet Count: 249 K/uL (ref 150–400)
RBC: 3.53 MIL/uL — ABNORMAL LOW (ref 3.87–5.11)
RDW: 16 % — ABNORMAL HIGH (ref 11.5–15.5)
WBC Count: 7.1 K/uL (ref 4.0–10.5)
nRBC: 0 % (ref 0.0–0.2)

## 2024-10-23 MED ORDER — CLINDAMYCIN PHOS (ONCE-DAILY) 1 % EX GEL: 1.0000 | Freq: Every day | CUTANEOUS | 0 refills | Status: AC

## 2024-10-23 MED ORDER — SODIUM CHLORIDE 0.9 % IV SOLN: INTRAVENOUS | Status: DC

## 2024-10-23 MED ORDER — DIPHENHYDRAMINE HCL 50 MG/ML IJ SOLN
50.0000 mg | Freq: Once | INTRAMUSCULAR | Status: AC
  Administered 2024-10-23: 50 mg via INTRAVENOUS
  Filled 2024-10-23: qty 1

## 2024-10-23 MED ORDER — DEXAMETHASONE SOD PHOSPHATE PF 10 MG/ML IJ SOLN
10.0000 mg | Freq: Once | INTRAMUSCULAR | Status: AC
  Administered 2024-10-23: 10 mg via INTRAVENOUS
  Filled 2024-10-23: qty 1

## 2024-10-23 MED ORDER — PALONOSETRON HCL INJECTION 0.25 MG/5ML
0.2500 mg | Freq: Once | INTRAVENOUS | Status: AC
  Administered 2024-10-23: 0.25 mg via INTRAVENOUS
  Filled 2024-10-23: qty 5

## 2024-10-23 MED ORDER — SODIUM CHLORIDE 0.9 % IV SOLN
605.0000 mg | Freq: Once | INTRAVENOUS | Status: AC
  Administered 2024-10-23: 610 mg via INTRAVENOUS
  Filled 2024-10-23: qty 61

## 2024-10-23 MED ORDER — SODIUM CHLORIDE 0.9 % IV SOLN
150.0000 mg/m2 | Freq: Once | INTRAVENOUS | Status: AC
  Administered 2024-10-23: 282 mg via INTRAVENOUS
  Filled 2024-10-23: qty 47

## 2024-10-23 MED ORDER — SODIUM CHLORIDE 0.9 % IV SOLN
150.0000 mg | Freq: Once | INTRAVENOUS | Status: AC
  Administered 2024-10-23: 150 mg via INTRAVENOUS
  Filled 2024-10-23: qty 150

## 2024-10-23 MED ORDER — FAMOTIDINE IN NACL 20-0.9 MG/50ML-% IV SOLN
20.0000 mg | Freq: Once | INTRAVENOUS | Status: AC
  Administered 2024-10-23: 20 mg via INTRAVENOUS
  Filled 2024-10-23: qty 50

## 2024-10-23 NOTE — Patient Instructions (Signed)
 [facility] - A DEPT OF [facility]  Discharge Instructions: Thank you for choosing [facility] to provide your oncology and hematology care.   If you have a lab appointment with the Cancer Center, please go directly to the Cancer Center and check in at the registration area.   Wear comfortable clothing and clothing appropriate for easy access to any Portacath or PICC line.   We strive to give you quality time with your provider. You may need to reschedule your appointment if you arrive late (15 or more minutes).  Arriving late affects you and other patients whose appointments are after yours.  Also, if you miss three or more appointments without notifying the office, you may be dismissed from the clinic at the provider's discretion.      For prescription refill requests, have your pharmacy contact our office and allow 72 hours for refills to be completed.    Today you received the following chemotherapy and/or immunotherapy agents taxol and carboplatin      To help prevent nausea and vomiting after your treatment, we encourage you to take your nausea medication as directed.  BELOW ARE SYMPTOMS THAT SHOULD BE REPORTED IMMEDIATELY: *FEVER GREATER THAN 100.4 F (38 C) OR HIGHER *CHILLS OR SWEATING *NAUSEA AND VOMITING THAT IS NOT CONTROLLED WITH YOUR NAUSEA MEDICATION *UNUSUAL SHORTNESS OF BREATH *UNUSUAL BRUISING OR BLEEDING *URINARY PROBLEMS (pain or burning when urinating, or frequent urination) *BOWEL PROBLEMS (unusual diarrhea, constipation, pain near the anus) TENDERNESS IN MOUTH AND THROAT WITH OR WITHOUT PRESENCE OF ULCERS (sore throat, sores in mouth, or a toothache) UNUSUAL RASH, SWELLING OR PAIN  UNUSUAL VAGINAL DISCHARGE OR ITCHING   Items with * indicate a potential emergency and should be followed up as soon as possible or go to the Emergency Department if any problems should occur.  Please show the CHEMOTHERAPY ALERT CARD or  IMMUNOTHERAPY ALERT CARD at check-in to the Emergency Department and triage nurse.  Should you have questions after your visit or need to cancel or reschedule your appointment, please contact [facility] - A DEPT [facility]  Dept: [phone]  and follow the prompts.  Office hours are 8:00 a.m. to 4:30 p.m. Monday - Friday. Please note that voicemails left after 4:00 p.m. may not be returned until the following business day.  We are closed weekends and major holidays. You have access to a nurse at all times for urgent questions. Please call the main number to the clinic Dept: [phone] and follow the prompts.   For any non-urgent questions, you may also contact your provider using MyChart. We now offer e-Visits for anyone 62 and older to request care online for non-urgent symptoms. For details visit [email or web address].   Also download the MyChart app! Go to the app store, search "MyChart", open the app, select [place], and log in with your MyChart username and password.

## 2024-10-23 NOTE — Progress Notes (Signed)
 Per Dr. Sherrod, it is okay to treat pt today with Carboplatin  and taxol  using creatinine of 0.53 on 12/23.

## 2024-10-23 NOTE — Telephone Encounter (Addendum)
 Pharmacy Patient Advocate Encounter   Received notification from Physician's Office that prior authorization for TRETINOIN  is required/requested.   Insurance verification completed.   The patient is insured through [facility] MEDICAID.   Per test claim: Per test claim, medication is not covered due to plan/benefit exclusion, PA not submitted at this time  Please see list of formulary for alternatives.  [email or web address]
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
## 2024-10-23 NOTE — Progress Notes (Signed)
 [facility] CSW Progress Note  Clinical Social Worker contacted pt's daughter by phone to follow-up on financial concerns.    Interventions: CSW sent a referral to Cancer Services on behalf of pt.  Pt's daughter informed referral sent today and she should expect a call to register pt.        Follow Up Plan:  Patient will contact CSW with any support or resource needs    Devere JONELLE Manna, LCSW Clinical Social Worker [facility]    Patient is participating in a Managed Medicaid Plan:  Yes

## 2024-10-23 NOTE — Telephone Encounter (Signed)
 Can we get a prior auth on Tretinoin  0.05% Cream?

## 2024-10-23 NOTE — Progress Notes (Unsigned)
 Complex Care Management Note Care Guide Note  10/23/2024 Name: Peggy Warren MRN: [id number] DOB: [birth year]   Complex Care Management Outreach Attempts: An unsuccessful telephone outreach was attempted today to offer the patient information about available complex care management services.  Follow Up Plan:  Additional outreach attempts will be made to offer the patient complex care management information and services.   Encounter Outcome:  No Answer  Dreama [facility]  [facility], [facility] VBCI Assistant Direct Dial: [phone]  Fax: [phone]

## 2024-10-24 NOTE — Telephone Encounter (Signed)
 Left message for patient to return call. I will also send patient a Mychart message.

## 2024-10-25 ENCOUNTER — Inpatient Hospital Stay

## 2024-10-25 VITALS — BP 145/71 | HR 88 | Temp 98.1°F | Resp 18

## 2024-10-25 DIAGNOSIS — Z5111 Encounter for antineoplastic chemotherapy: Secondary | ICD-10-CM | POA: Diagnosis not present

## 2024-10-25 DIAGNOSIS — C3411 Malignant neoplasm of upper lobe, right bronchus or lung: Secondary | ICD-10-CM

## 2024-10-25 MED ORDER — PEGFILGRASTIM-CBQV 6 MG/0.6ML ~~LOC~~ SOSY
6.0000 mg | PREFILLED_SYRINGE | Freq: Once | SUBCUTANEOUS | Status: AC
  Administered 2024-10-25: 6 mg via SUBCUTANEOUS
  Filled 2024-10-25: qty 0.6

## 2024-10-25 NOTE — Progress Notes (Signed)
 Complex Care Management Note  Care Guide Note 10/25/2024 Name: Peggy Warren MRN: [id number] DOB: [birth year]  Peggy Warren is a 59 y.o. year old female who sees McElwee, Lauren A, NP for primary care. I reached out to Peggy Warren by phone today to offer complex care management services.  [name] was given information about Complex Care Management services today including:   The Complex Care Management services include support from the care team which includes your Nurse Care Manager, Clinical Social Worker, or Pharmacist.  The Complex Care Management team is here to help remove barriers to the health concerns and goals most important to you. Complex Care Management services are voluntary, and the patient may decline or stop services at any time by request to their care team member.   Complex Care Management Consent Status: Patient agreed to services and verbal consent obtained.   Follow up plan:  Telephone appointment with complex care management team member scheduled for:  10/31/24 & 11/07/24.   Encounter Outcome:  Patient Scheduled  Dreama [facility]  [facility] [place], [facility] VBCI Assistant Direct Dial: [phone]  Fax: [phone]

## 2024-10-26 ENCOUNTER — Telehealth: Payer: Self-pay

## 2024-10-26 ENCOUNTER — Ambulatory Visit: Payer: Self-pay

## 2024-10-26 NOTE — Telephone Encounter (Signed)
 FYI Only or Action Required?: Action required by provider: clinical question for provider.  Patient was last seen in primary care on 10/17/2024 by Nedra Tinnie LABOR, NP.  Called Nurse Triage reporting Blood Sugar Problem.  Symptoms began yesterday.  Interventions attempted: Dietary changes.  Symptoms are: unchanged.  Triage Disposition: Call PCP When Office is Open  Patient/caregiver understands and will follow disposition?: Yes  Reason for Disposition  [1] Morning (before breakfast) blood glucose < 80 mg/dL (4.4 mmol/L) AND [7] more than once in past week  Answer Assessment - Initial Assessment Questions Patient states that blood sugar has been dropping frequently between yesterday and today. She has been able to bring levels up by eating food items with sugar in them. Spoke with patient's daughter as well who states that she's concerned patient is taking too much insulin -mentions that a few months ago blood sugars were very high and insulin  was increased. Notes that patient finished chemo and received last injection yesterday. Advised to hold off on taking next dose of insulin  until patient hears back from PCP. Advised to call back if symptoms worsen or blood sugar remains low.   1. SYMPTOMS: What symptoms are you concerned about?     Low blood sugar  2. ONSET:  When did the symptoms start?     Yesterday  3. BLOOD GLUCOSE: What is your blood glucose level?      62 at 11 AM , patient then ate peaches and BS is now 92  4. USUAL RANGE: What is your blood glucose level usually? (e.g., usual fasting morning value, usual evening value)     98-140  5. TYPE 1 or 2:  Do you know what type of diabetes you have?  (e.g., Type 1, Type 2, Gestational; doesn't know)      Type 2  6. INSULIN : Do you take insulin ? What type of insulin (s) do you use? What is the mode of delivery? (syringe, pen; injection or pump) When did you last give yourself an insulin  dose? (i.e., time or  hours/minutes ago) How much did you give? (i.e., how many units)     Lantus  35 units BID, Novolog  13 units TID before meals  7. DIABETES PILLS: Do you take any pills for your diabetes? If Yes, ask: What is the name of the medicine(s) that you take for high blood sugar?     Metformin   8. OTHER SYMPTOMS: Do you have any symptoms? (e.g., fever, frequent urination, difficulty breathing, vomiting)     No symptoms during time of triage  9. LOW BLOOD GLUCOSE TREATMENT: What have you done so far to treat the low blood glucose level?     Ate peaches  10. FOOD: When did you last eat or drink?       Eggs and Peaches  11. ALONE: Are you alone right now or is someone with you?        Daughter is with patient  9. PREGNANCY: Is there any chance you are pregnant? When was your last menstrual period?       NA  Protocols used: Diabetes - Low Blood Sugar-A-AH  Reason for Triage:  Low Sugar stated last reading was in the 60s, and it keeps dropping, feeling shaky

## 2024-10-26 NOTE — Telephone Encounter (Signed)
 I called and spoke with patient and he sugars are dropping after she eats. She has already taken a Novolog  injection. Lauren has advised for patient her sugar is less than 120 with her meals, do not take any Novolog , otherwise if higher than 120 take 5 units. Patient advised to eat and she is and she also has a freestyle libre and does not under how to attach to our clinic via the APP and she wil have her daughter do it.

## 2024-10-31 ENCOUNTER — Other Ambulatory Visit: Payer: Self-pay | Admitting: Licensed Clinical Social Worker

## 2024-10-31 NOTE — Patient Outreach (Signed)
 LCSW called and spoke to patient at scheduled appointment time. LCSW introduced self and explained reason for the call. LCSW answered patients questions about the VBCI program and the services we provide. Patient stated she wanted to talk it over with her daughter and see if this is a services she needs. LCSW offered to call patient back, patient declined and said she will reach out to LCSW when ready. LCSW gave patient direct number and encouraged her to call back wen ready.  Peggy Ligas, LCSW Clinical Social Worker [facility] Population Health

## 2024-10-31 NOTE — Patient Instructions (Signed)
 Visit Information  Ms. Dearman was given information about Medicaid Managed Care team care coordination services as a part of their [employer] Medicaid benefit.   If you would like to schedule transportation through your [facility] plan, please call the following number at least 2 days in advance of your appointment: [phone]  If you are experiencing a behavioral health crisis, call the [employer] [place]  Behavioral Health Crisis Line at [phone]). The line is available 24 hours a day, seven days a week.   Patient verbalizes understanding of instructions and care plan provided today and agrees to view in MyChart. Active MyChart status and patient understanding of how to access instructions and care plan via MyChart confirmed with patient.     Patient will reach out to LCSW to schedule nxt appointment.  Cena Ligas, LCSW Clinical Social Worker [facility] Population Health   Following is a copy of your plan of care:  Goals Addressed   None

## 2024-10-31 NOTE — Telephone Encounter (Signed)
 ERROR

## 2024-11-01 ENCOUNTER — Ambulatory Visit: Admitting: Sports Medicine

## 2024-11-06 NOTE — Progress Notes (Unsigned)
 "  11/07/2024 Name: Peggy Warren MRN: [id number] DOB: 07-17-1966  Chief Complaint  Patient presents with   Diabetes Management Plan   Peggy Warren is a 59 y.o. year old female who presented for a telephone visit.   They were referred to the pharmacist by their PCP for assistance in managing diabetes, mental health.   Subjective/Objective:  Care Team: Primary Care Provider: Nedra Tinnie DELENA, NP ; Next Scheduled Visit: 11/28/24  Medication Access/Adherence  Patient reports affordability concerns with their medications: No  Patient reports access/transportation concerns to their pharmacy: No  Patient reports adherence concerns with their medications:  No    Diabetes: A1c on 08/13/2024 was 11.9. Patient reports she stopped taking insulin  about 2 weeks ago due to BG readings being too low.  Prescribed: Novolog  13 units 3 times daily before meals, Lantus  35 units 2 times daily, Metformin  500mg  two times daily She also stopped taking Metformin  due to GI side effects. She wears a libre sensor and just placed a new one Sunday, but she has difficulty using her monitor. Current glucose reading is 108, she had cereal for breakfast around 6am. States has had readings in 60s Patient recalls being on Ozempic  but prefers not to try this again due to changes in appetite. She expressed confusion for why she needs to be on weight loss medication, discussed that this medication also helps to lower blood glucose and would help better control her A1c and decrease amount of insulin  needed. No UACR on file ACEi/ARB for cardiorenal protection:  lisinopril  5mg  daily Statin for ASCVD risk reduction:  atorvastatin  40mg  daily  Lab Results  Component Value Date   HGBA1C 11.9 (H) 08/13/2024   HGBA1C 12.5 (H) 08/11/2024   HGBA1C 6.1 (A) 05/22/2024       Component Value Date/Time   NA 135 10/23/2024 1026   K 4.0 10/23/2024 1026   CL 97 (L) 10/23/2024 1026   CO2 22 10/23/2024 1026   GLUCOSE 86  10/23/2024 1026   BUN <5 (L) 10/23/2024 1026   CREATININE 0.49 10/23/2024 1026   CALCIUM  8.9 10/23/2024 1026   PROT 7.9 10/23/2024 1026   ALBUMIN  4.4 10/23/2024 1026   AST 39 10/23/2024 1026   ALT 19 10/23/2024 1026   ALKPHOS 103 10/23/2024 1026   BILITOT 0.3 10/23/2024 1026   GFR 100.17 08/25/2023 1125   GFRNONAA >60 10/23/2024 1026      Component Value Date/Time   CHOL 151 08/25/2023 1125   CHOL 184 07/28/2020 1050   TRIG 171.0 (H) 08/25/2023 1125   HDL 48.40 08/25/2023 1125   HDL 41 07/28/2020 1050   CHOLHDL 3 08/25/2023 1125   VLDL 34.2 08/25/2023 1125   LDLCALC 69 08/25/2023 1125   LDLCALC 118 (H) 07/28/2020 1050   LABVLDL 25 07/28/2020 1050    Hypertension: LOV 1/15: 145/71 Currently taking: amlodipine  10mg  and lisinopril  5mg  Has a blood pressure cuff at home, but has not been taking her blood pressure due to misplacing monitor  Depression/Anxiety: Patient reports improvement in symptoms. Would prefer not to pursue therapy at this time. Patient has a history of trying fluoxetine  and sertraline   Alcohol use: Patient reports she has decreased intake and is motivated to stop Prescribed Naltrexone  in the past but patient does not recall trying this medication.  Smoking Cessation: Patient has picked up nicotine  7mg  patches and has decreased number of cigarettes smoked per day. Currently smoking 5-6 cigarettes per day.   Assessment/Plan:   Diabetes: Currently uncontrolled, A1c goal <7%  BP is not at goal of <130/80 LDL is not at goal of <70, but last lipid panel was 2024 I recommend A1c, CMP, UACR and updated lipid pane at PCP visit in Feb- If LDL >70, consider inceasing atorvastatin  to 80mg  or changing to rosuvastatin 40mg  daily In-person visit scheduled for 2/4 at 10am. She was asked to bring her monitor and all of her medications to this visit. Due to reported lows, continue to hold insulin  until CGM readings can be assessed in office. Reviewed long term  cardiovascular and renal outcomes of uncontrolled blood sugar. Consider Trulicity or Rybelsus if patient agreeable (will require PA on Medicaid per test claims).  Consider SGLT2 in the future once A1c better controlled as long as no CI's present. Consider changing metformin  to XR formulation and starting with 500mg  with breafkast x7 days, then increasing as able by 500mg  weekly until at TDD of 1000mg  BID  Hypertension: Continue current regimen. Recommend to take blood pressure daily and keep a log to review at next in person visit. If patient is not able to locate home BP monitor, order can be sent to [facility] to bill to Medicaid  Depression/Anxiety: Consider SSRI if symptoms worsen  Alcohol use: Discussed Naltrexone , not interested at this time.  Smoking Cessation: Patient has picked up patches and has decreased number of cigarettes smoked per day. Currently smoking 5-6 cigarettes per day.   Follow Up Plan: in-person in 1 week  [name], PharmD, DPLA      "

## 2024-11-07 ENCOUNTER — Other Ambulatory Visit: Payer: Self-pay

## 2024-11-07 DIAGNOSIS — E119 Type 2 diabetes mellitus without complications: Secondary | ICD-10-CM

## 2024-11-07 DIAGNOSIS — F17200 Nicotine dependence, unspecified, uncomplicated: Secondary | ICD-10-CM

## 2024-11-07 DIAGNOSIS — Z86711 Personal history of pulmonary embolism: Secondary | ICD-10-CM

## 2024-11-07 DIAGNOSIS — I10 Essential (primary) hypertension: Secondary | ICD-10-CM

## 2024-11-07 DIAGNOSIS — J438 Other emphysema: Secondary | ICD-10-CM

## 2024-11-07 NOTE — Patient Instructions (Signed)
 It was a pleasure speaking with you today!  Please continue to hold insulin  and metformin . Please bring your [place] reader and all of your medications to our in-person follow-up visit. Please check your blood pressure once daily and maintain a log to review at our next visit.   Follow up visit: In-person on February 4 at 10am  Feel free to call with any questions or concerns!  Dionicia Canavan, PharmD, RPh PGY1 Acute Care Pharmacy Resident [facility]  11/07/2024 9:51 AM

## 2024-11-09 ENCOUNTER — Ambulatory Visit: Admitting: Internal Medicine

## 2024-11-12 ENCOUNTER — Inpatient Hospital Stay: Attending: Physician Assistant | Admitting: Licensed Clinical Social Worker

## 2024-11-12 DIAGNOSIS — C3411 Malignant neoplasm of upper lobe, right bronchus or lung: Secondary | ICD-10-CM

## 2024-11-13 ENCOUNTER — Inpatient Hospital Stay

## 2024-11-13 ENCOUNTER — Telehealth: Payer: Self-pay

## 2024-11-13 NOTE — Progress Notes (Unsigned)
 "  11/13/2024 Name: VALENCIA KASSA MRN: [id number] DOB: 17-Jul-1966  No chief complaint on file.  GENISE STRACK is a 59 y.o. year old female who presented for a telephone visit.   They were referred to the pharmacist by their PCP for assistance in managing diabetes   Subjective/Objective:  Care Team: Primary Care Provider: Nedra Tinnie DELENA, NP ; Next Scheduled Visit: 11/28/24  Medication Access/Adherence  Patient reports affordability concerns with their medications: No  Patient reports access/transportation concerns to their pharmacy: No  Patient reports adherence concerns with their medications:  Yes   Diabetes: A1c on 08/13/2024 was 11.9. Patient reports she stopped taking insulin  about 3 weeks ago due to BG readings being too low.  Prescribed: Novolog  13 units 3 times daily before meals, Lantus  35 units 2 times daily, Metformin  500mg  two times daily She also stopped taking Metformin  due to GI side effects. She wears a libre sensor and states avg BG the last 7 days is 114 Current glucose reading is 110, she had cereal for breakfast a couple hours prio Patient recalls being on Ozempic  but prefers not to try this again due to changes in appetite No UACR on file ACEi/ARB for cardiorenal protection:  lisinopril  5mg  daily Statin for ASCVD risk reduction:  atorvastatin  40mg  daily  Lab Results  Component Value Date   HGBA1C 11.9 (H) 08/13/2024   HGBA1C 12.5 (H) 08/11/2024   HGBA1C 6.1 (A) 05/22/2024       Component Value Date/Time   NA 135 10/23/2024 1026   K 4.0 10/23/2024 1026   CL 97 (L) 10/23/2024 1026   CO2 22 10/23/2024 1026   GLUCOSE 86 10/23/2024 1026   BUN <5 (L) 10/23/2024 1026   CREATININE 0.49 10/23/2024 1026   CALCIUM  8.9 10/23/2024 1026   PROT 7.9 10/23/2024 1026   ALBUMIN  4.4 10/23/2024 1026   AST 39 10/23/2024 1026   ALT 19 10/23/2024 1026   ALKPHOS 103 10/23/2024 1026   BILITOT 0.3 10/23/2024 1026   GFR 100.17 08/25/2023 1125   GFRNONAA >60  10/23/2024 1026      Component Value Date/Time   CHOL 151 08/25/2023 1125   CHOL 184 07/28/2020 1050   TRIG 171.0 (H) 08/25/2023 1125   HDL 48.40 08/25/2023 1125   HDL 41 07/28/2020 1050   CHOLHDL 3 08/25/2023 1125   VLDL 34.2 08/25/2023 1125   LDLCALC 69 08/25/2023 1125   LDLCALC 118 (H) 07/28/2020 1050   LABVLDL 25 07/28/2020 1050    Hypertension: LOV 1/15: 145/71 Currently taking: amlodipine  10mg  and lisinopril  5mg  Has a blood pressure cuff at home, but has not been taking her blood pressure due to misplacing monitor  Smoking Cessation: Using Nicotine  7mg  patch Patient states still smoking 10 cigarettes per day with 1st being within 30 minutes of waking  Assessment/Plan:   Diabetes: Currently uncontrolled, A1c goal <7%, but Her[place] data reflects good control BP is not at goal of <130/80 LDL is not at goal of <70, but last lipid panel was 2024 Continue to hold insulins and metformin  at this time Sees PCP again 2/18- advised to bring [place] reader for provider to review Due for A1c, CMP, UACR, and lipids- if LDL >70, consider increasing atorvastatin  to 80mg  or changing to rosuvastatin 40mg  If A1c not at goal, consider resuming metformin  but with XR tablets and starting with 1 tablet daily and titrating dose weekly as tolerated  Hypertension: Continue current regimen until follow-up with PCP Begin monitoring and recoding home BP regularly- order  for new monitor sent to [facility] where they can bill insurance If BP consistently >130/80, consider increasing lisinopril  to 10mg  daily  Smoking Cessation: I recommend increasing patient to step 1 (21mg ) patches based on nicotine  use while on 7mg  patches- order pending for PCP to sign if in agreement Goal to taper dose every 4-8 weeks  Follow Up Plan: 3/18  [name], PharmD, DPLA      "

## 2024-11-13 NOTE — Telephone Encounter (Signed)
 Spoke with patient regarding upcoming appointments. Patients lab appt was rescheduled to 10:00 AM prior to CT scan scheduled on 11/23/24. Informed patient that follow-up appointment with Cassie, PA should occur after CT scan. Patients follow-up appointment with Cassie, PA was rescheduled to 12/03/24 at 10:30 AM. Patient voiced thanks.

## 2024-11-13 NOTE — Progress Notes (Signed)
" ° °  11/13/2024  Patient ID: Peggy Warren, female   DOB: [birth year], 59 y.o.   MRN: [id number]  Patient outreach to confirm patient's 10am in person visit with me tomorrow at [facility].  Patient states she forgot about visit, and she has got her Libre CGM working.  Appointment changed to telephone visit, and patient's daughter will be present with her to help verify medications and provide BG readings.  [name], PharmD, DPLA  "

## 2024-11-14 ENCOUNTER — Other Ambulatory Visit: Payer: Self-pay

## 2024-11-14 DIAGNOSIS — I10 Essential (primary) hypertension: Secondary | ICD-10-CM

## 2024-11-14 DIAGNOSIS — J438 Other emphysema: Secondary | ICD-10-CM

## 2024-11-14 DIAGNOSIS — F17200 Nicotine dependence, unspecified, uncomplicated: Secondary | ICD-10-CM

## 2024-11-15 ENCOUNTER — Other Ambulatory Visit: Payer: Self-pay

## 2024-11-15 ENCOUNTER — Other Ambulatory Visit: Payer: Self-pay | Admitting: Nurse Practitioner

## 2024-11-15 ENCOUNTER — Telehealth: Payer: Self-pay

## 2024-11-15 MED ORDER — OMRON 3 SERIES BP MONITOR DEVI: 0 refills | Status: AC

## 2024-11-15 NOTE — Telephone Encounter (Signed)
 Requesting: nicotine  7 mg/24 hr daily transdermal patch  Last Visit: 10/17/2024 Next Visit: 11/28/2024 Last Refill: 10/17/2024  Please Advise

## 2024-11-15 NOTE — Telephone Encounter (Signed)
 Copied from CRM [id number]. Topic: Clinical - Medical Advice >> Nov 15, 2024  1:34 PM Macario HERO wrote: Reason for CRM: Patient called said she goes to the orthopedic doctor next week and wanted to know if she can get a cortisone shot for her right leg. Patient requesting a call from provider nurse.

## 2024-11-15 NOTE — Telephone Encounter (Signed)
 I called and spoke with patient and notified her of below message and she wanted to know with her help problems was it ok to have an injection. Per Tinnie it is ok and it may raise her blood sugars up.

## 2024-11-16 ENCOUNTER — Other Ambulatory Visit: Payer: Self-pay

## 2024-11-16 MED ORDER — NICOTINE 21 MG/24HR TD PT24: 21.0000 mg | MEDICATED_PATCH | Freq: Every day | TRANSDERMAL | 1 refills | Status: AC
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
## 2024-11-16 NOTE — Patient Instructions (Addendum)
 Visit Information  Peggy Warren was given information about Medicaid Managed Care team care coordination services as a part of their [employer] Medicaid benefit.   If you would like to schedule transportation through your [employer] [facility] plan, please call the following number at least 2 days in advance of your appointment: [phone]  If you are experiencing a behavioral health crisis, call the [employer] [place]  Behavioral Health Crisis Line at [phone]). The line is available 24 hours a day, seven days a week.    Please see education materials related to DM provided by MyChart link.  Care plan and visit instructions communicated with the patient verbally today. Patient agrees to receive a copy in MyChart. Active MyChart status and patient understanding of how to access instructions and care plan via MyChart confirmed with patient.     Telephone follow up appointment with Managed Medicaid care management team member scheduled for: 12/11/24 at 10:30 a.m with Elida Pulse, RNCM  Franklin Honour, RN, BSN [place]/ VBCI-Population Health RN care manager Direct dial: [phone]  Following is a copy of your plan of care:   Goals Addressed             This Visit's Progress    VBCI RN Care Plan       Problems:  Chronic Disease Management support and education needs related to DMII Admit 09/07/24 with acute PE-patient denies any questions or concerns. Discussed importance of taking Eliquis  as prescribed.  Upcoming appointments: 12/23 oncology;  10/17/24 transfer new PCP 10/18/24 orthopedic 10/23/24 oncology  Goal: Over the next 90 days the Patient will continue to work with RN Care Manager and/or Social Worker to address care management and care coordination needs related to DMII as evidenced by adherence to care management team scheduled appointments     demonstrate Improved adherence to prescribed treatment plan for  DMII as evidenced by patient report and/or review of chart take all medications exactly as prescribed and will call provider for medication related questions as evidenced by patient report and/or review of chart    verbalize understanding of plan for management of DMII as evidenced by patient report and/or review of chart  Interventions:  Diabetes Interventions: Assessed patient's understanding of A1c goal: <8% Reviewed medications with patient and discussed importance of medication adherence Assessed for any signs/symptoms of hypoglycemia or hyperglycemia Reiterated Portion control during the holidays and encouraged minimize concentrated sweets/simple carbohydrates and monitor carbohydrate intake. RNCM advised patient to continue to check and record BS Diabetes education provided re: Diabetes  Patient Self-Care Activities:  Attend all scheduled provider appointments Call provider office for new concerns or questions  schedule appointment with eye doctor Contact provider with BS consistently low < 70 or consistently high>200  Plan:  Telephone follow up appointment with care management team member scheduled for:  12/11/24 at 10:30 am

## 2024-11-20 ENCOUNTER — Inpatient Hospital Stay: Admitting: Physician Assistant

## 2024-11-23 ENCOUNTER — Inpatient Hospital Stay

## 2024-11-23 ENCOUNTER — Ambulatory Visit (HOSPITAL_COMMUNITY)

## 2024-11-28 ENCOUNTER — Ambulatory Visit: Admitting: Nurse Practitioner

## 2024-12-03 ENCOUNTER — Inpatient Hospital Stay: Admitting: Physician Assistant

## 2024-12-11 ENCOUNTER — Telehealth

## 2024-12-26 ENCOUNTER — Other Ambulatory Visit
